# Patient Record
Sex: Male | Born: 1975
Health system: Southern US, Community
[De-identification: ages and names within clinical notes are randomized; demographics above are authoritative.]

## PROBLEM LIST (undated history)

## (undated) DIAGNOSIS — E119 Type 2 diabetes mellitus without complications: Secondary | ICD-10-CM

## (undated) DIAGNOSIS — K760 Fatty (change of) liver, not elsewhere classified: Secondary | ICD-10-CM

## (undated) DIAGNOSIS — I10 Essential (primary) hypertension: Secondary | ICD-10-CM

---

## 2008-04-06 ENCOUNTER — Emergency Department (HOSPITAL_COMMUNITY): Admission: EM | Admit: 2008-04-06 | Discharge: 2008-04-06 | Payer: Self-pay | Admitting: Emergency Medicine

## 2008-06-03 ENCOUNTER — Emergency Department (HOSPITAL_COMMUNITY): Admission: EM | Admit: 2008-06-03 | Discharge: 2008-06-03 | Payer: Self-pay | Admitting: Emergency Medicine

## 2008-08-12 ENCOUNTER — Emergency Department (HOSPITAL_COMMUNITY): Admission: EM | Admit: 2008-08-12 | Discharge: 2008-08-12 | Payer: Self-pay | Admitting: Emergency Medicine

## 2008-12-06 ENCOUNTER — Emergency Department: Payer: Self-pay | Admitting: Emergency Medicine

## 2009-10-13 ENCOUNTER — Emergency Department (HOSPITAL_COMMUNITY): Admission: EM | Admit: 2009-10-13 | Discharge: 2009-10-13 | Payer: Self-pay | Admitting: Emergency Medicine

## 2010-03-11 ENCOUNTER — Emergency Department: Payer: Self-pay | Admitting: Emergency Medicine

## 2010-04-17 ENCOUNTER — Emergency Department: Payer: Self-pay | Admitting: Emergency Medicine

## 2010-05-19 ENCOUNTER — Emergency Department: Payer: Self-pay | Admitting: Internal Medicine

## 2010-07-21 ENCOUNTER — Emergency Department (HOSPITAL_COMMUNITY)
Admission: EM | Admit: 2010-07-21 | Discharge: 2010-07-21 | Payer: Self-pay | Source: Home / Self Care | Admitting: Emergency Medicine

## 2012-03-15 ENCOUNTER — Emergency Department: Payer: Self-pay | Admitting: Emergency Medicine

## 2012-07-22 ENCOUNTER — Emergency Department: Payer: Self-pay | Admitting: Emergency Medicine

## 2012-09-14 ENCOUNTER — Emergency Department: Payer: Self-pay | Admitting: Emergency Medicine

## 2014-06-20 ENCOUNTER — Emergency Department: Payer: Self-pay | Admitting: Emergency Medicine

## 2014-06-20 LAB — COMPREHENSIVE METABOLIC PANEL
ALBUMIN: 3.6 g/dL (ref 3.4–5.0)
ALK PHOS: 47 U/L
ALT: 45 U/L
Anion Gap: 8 (ref 7–16)
BUN: 9 mg/dL (ref 7–18)
Bilirubin,Total: 0.4 mg/dL (ref 0.2–1.0)
CHLORIDE: 108 mmol/L — AB (ref 98–107)
CREATININE: 1.15 mg/dL (ref 0.60–1.30)
Calcium, Total: 8 mg/dL — ABNORMAL LOW (ref 8.5–10.1)
Co2: 23 mmol/L (ref 21–32)
Glucose: 111 mg/dL — ABNORMAL HIGH (ref 65–99)
Osmolality: 277 (ref 275–301)
Potassium: 4 mmol/L (ref 3.5–5.1)
SGOT(AST): 37 U/L (ref 15–37)
Sodium: 139 mmol/L (ref 136–145)
Total Protein: 7.6 g/dL (ref 6.4–8.2)

## 2014-06-20 LAB — CBC WITH DIFFERENTIAL/PLATELET
Basophil #: 0.1 10*3/uL (ref 0.0–0.1)
Basophil %: 0.8 %
EOS ABS: 0.2 10*3/uL (ref 0.0–0.7)
Eosinophil %: 2.5 %
HCT: 43 % (ref 40.0–52.0)
HGB: 14.6 g/dL (ref 13.0–18.0)
LYMPHS PCT: 21.9 %
Lymphocyte #: 2 10*3/uL (ref 1.0–3.6)
MCH: 32 pg (ref 26.0–34.0)
MCHC: 34 g/dL (ref 32.0–36.0)
MCV: 94 fL (ref 80–100)
Monocyte #: 0.7 x10 3/mm (ref 0.2–1.0)
Monocyte %: 7.6 %
NEUTROS PCT: 67.2 %
Neutrophil #: 6.2 10*3/uL (ref 1.4–6.5)
PLATELETS: 251 10*3/uL (ref 150–440)
RBC: 4.57 10*6/uL (ref 4.40–5.90)
RDW: 12.9 % (ref 11.5–14.5)
WBC: 9.2 10*3/uL (ref 3.8–10.6)

## 2014-06-20 LAB — URINALYSIS, COMPLETE
BILIRUBIN, UR: NEGATIVE
BLOOD: NEGATIVE
Bacteria: NONE SEEN
GLUCOSE, UR: NEGATIVE mg/dL (ref 0–75)
KETONE: NEGATIVE
Leukocyte Esterase: NEGATIVE
NITRITE: NEGATIVE
PH: 5 (ref 4.5–8.0)
PROTEIN: NEGATIVE
RBC,UR: 1 /HPF (ref 0–5)
SPECIFIC GRAVITY: 1.026 (ref 1.003–1.030)
WBC UR: 7 /HPF (ref 0–5)

## 2014-06-20 LAB — LIPASE, BLOOD: Lipase: 86 U/L (ref 73–393)

## 2014-06-20 LAB — TROPONIN I: Troponin-I: <0.02 ng/mL

## 2014-07-02 ENCOUNTER — Inpatient Hospital Stay (HOSPITAL_COMMUNITY)
Admission: EM | Admit: 2014-07-02 | Discharge: 2014-07-05 | DRG: 872 | Disposition: A | Discharge status: Home / Self Care | Payer: Self-pay | Attending: Internal Medicine | Admitting: Internal Medicine

## 2014-07-02 ENCOUNTER — Encounter (HOSPITAL_COMMUNITY): Payer: Self-pay | Admitting: Emergency Medicine

## 2014-07-02 ENCOUNTER — Emergency Department (HOSPITAL_COMMUNITY): Payer: Self-pay

## 2014-07-02 DIAGNOSIS — K600 Acute anal fissure: Secondary | ICD-10-CM | POA: Diagnosis present

## 2014-07-02 DIAGNOSIS — A5409 Other gonococcal infection of lower genitourinary tract: Secondary | ICD-10-CM | POA: Diagnosis present

## 2014-07-02 DIAGNOSIS — N281 Cyst of kidney, acquired: Secondary | ICD-10-CM | POA: Diagnosis present

## 2014-07-02 DIAGNOSIS — N200 Calculus of kidney: Secondary | ICD-10-CM | POA: Diagnosis present

## 2014-07-02 DIAGNOSIS — K5900 Constipation, unspecified: Secondary | ICD-10-CM | POA: Diagnosis present

## 2014-07-02 DIAGNOSIS — R1084 Generalized abdominal pain: Secondary | ICD-10-CM

## 2014-07-02 DIAGNOSIS — F1210 Cannabis abuse, uncomplicated: Secondary | ICD-10-CM | POA: Diagnosis present

## 2014-07-02 DIAGNOSIS — K921 Melena: Secondary | ICD-10-CM | POA: Diagnosis present

## 2014-07-02 DIAGNOSIS — N12 Tubulo-interstitial nephritis, not specified as acute or chronic: Secondary | ICD-10-CM | POA: Diagnosis present

## 2014-07-02 DIAGNOSIS — N390 Urinary tract infection, site not specified: Secondary | ICD-10-CM

## 2014-07-02 DIAGNOSIS — R109 Unspecified abdominal pain: Secondary | ICD-10-CM | POA: Diagnosis present

## 2014-07-02 DIAGNOSIS — A549 Gonococcal infection, unspecified: Secondary | ICD-10-CM | POA: Diagnosis present

## 2014-07-02 DIAGNOSIS — K602 Anal fissure, unspecified: Secondary | ICD-10-CM | POA: Diagnosis present

## 2014-07-02 DIAGNOSIS — R0602 Shortness of breath: Secondary | ICD-10-CM

## 2014-07-02 DIAGNOSIS — A419 Sepsis, unspecified organism: Principal | ICD-10-CM | POA: Diagnosis present

## 2014-07-02 DIAGNOSIS — K760 Fatty (change of) liver, not elsewhere classified: Secondary | ICD-10-CM | POA: Diagnosis present

## 2014-07-02 LAB — RAPID URINE DRUG SCREEN, HOSP PERFORMED
Amphetamines: NOT DETECTED
BARBITURATES: NOT DETECTED
BENZODIAZEPINES: NOT DETECTED
Cocaine: NOT DETECTED
Opiates: NOT DETECTED
Tetrahydrocannabinol: POSITIVE — AB

## 2014-07-02 LAB — URINALYSIS, ROUTINE W REFLEX MICROSCOPIC
BILIRUBIN URINE: NEGATIVE
GLUCOSE, UA: NEGATIVE mg/dL
Ketones, ur: NEGATIVE mg/dL
NITRITE: NEGATIVE
PH: 8.5 — AB (ref 5.0–8.0)
Protein, ur: 30 mg/dL — AB
UROBILINOGEN UA: 0.2 mg/dL (ref 0.0–1.0)

## 2014-07-02 LAB — CBC WITH DIFFERENTIAL/PLATELET
BASOS ABS: 0 10*3/uL (ref 0.0–0.1)
BASOS PCT: 0 % (ref 0–1)
EOS PCT: 1 % (ref 0–5)
Eosinophils Absolute: 0.1 10*3/uL (ref 0.0–0.7)
HCT: 41.4 % (ref 39.0–52.0)
Hemoglobin: 14.1 g/dL (ref 13.0–17.0)
LYMPHS ABS: 1.2 10*3/uL (ref 0.7–4.0)
Lymphocytes Relative: 8 % — ABNORMAL LOW (ref 12–46)
MCH: 30.9 pg (ref 26.0–34.0)
MCHC: 34.1 g/dL (ref 30.0–36.0)
MCV: 90.6 fL (ref 78.0–100.0)
MONO ABS: 0.7 10*3/uL (ref 0.1–1.0)
MONOS PCT: 5 % (ref 3–12)
NEUTROS ABS: 12.9 10*3/uL — AB (ref 1.7–7.7)
NEUTROS PCT: 86 % — AB (ref 43–77)
Platelets: 274 10*3/uL (ref 150–400)
RBC: 4.57 MIL/uL (ref 4.22–5.81)
RDW: 12.3 % (ref 11.5–15.5)
WBC: 14.8 10*3/uL — AB (ref 4.0–10.5)

## 2014-07-02 LAB — COMPREHENSIVE METABOLIC PANEL
ALBUMIN: 3.9 g/dL (ref 3.5–5.2)
ALT: 27 U/L (ref 0–53)
ANION GAP: 16 — AB (ref 5–15)
AST: 26 U/L (ref 0–37)
Alkaline Phosphatase: 50 U/L (ref 39–117)
BUN: 9 mg/dL (ref 6–23)
CHLORIDE: 99 meq/L (ref 96–112)
CO2: 20 mEq/L (ref 19–32)
Calcium: 9 mg/dL (ref 8.4–10.5)
Creatinine, Ser: 0.9 mg/dL (ref 0.50–1.35)
GLUCOSE: 107 mg/dL — AB (ref 70–99)
POTASSIUM: 3.8 meq/L (ref 3.7–5.3)
Sodium: 135 mEq/L — ABNORMAL LOW (ref 137–147)
Total Bilirubin: 0.6 mg/dL (ref 0.3–1.2)
Total Protein: 7.7 g/dL (ref 6.0–8.3)

## 2014-07-02 LAB — URINE MICROSCOPIC-ADD ON

## 2014-07-02 LAB — HIV ANTIBODY (ROUTINE TESTING W REFLEX): HIV 1&2 Ab, 4th Generation: NONREACTIVE

## 2014-07-02 LAB — LIPASE, BLOOD: Lipase: 16 U/L (ref 11–59)

## 2014-07-02 LAB — RPR

## 2014-07-02 LAB — APTT: aPTT: 30 seconds (ref 24–37)

## 2014-07-02 LAB — PROTIME-INR
INR: 1.01 (ref 0.00–1.49)
Prothrombin Time: 13.4 seconds (ref 11.6–15.2)

## 2014-07-02 MED ORDER — HYDROMORPHONE HCL 1 MG/ML IJ SOLN
1.0000 mg | Freq: Once | INTRAMUSCULAR | Status: AC
  Administered 2014-07-02: 1 mg via INTRAVENOUS
  Filled 2014-07-02: qty 1

## 2014-07-02 MED ORDER — ONDANSETRON HCL 4 MG/2ML IJ SOLN
4.0000 mg | Freq: Four times a day (QID) | INTRAMUSCULAR | Status: DC | PRN
  Administered 2014-07-03 – 2014-07-04 (×4): 4 mg via INTRAVENOUS
  Filled 2014-07-02 (×6): qty 2

## 2014-07-02 MED ORDER — HYDROMORPHONE HCL 1 MG/ML IJ SOLN: 1.0000 mg | INTRAMUSCULAR | Status: DC | PRN

## 2014-07-02 MED ORDER — ACETAMINOPHEN 500 MG PO TABS
1000.0000 mg | ORAL_TABLET | Freq: Once | ORAL | Status: AC
  Administered 2014-07-02: 1000 mg via ORAL
  Filled 2014-07-02: qty 2

## 2014-07-02 MED ORDER — HYDROMORPHONE HCL 1 MG/ML IJ SOLN
INTRAMUSCULAR | Status: AC
  Filled 2014-07-02: qty 1

## 2014-07-02 MED ORDER — ONDANSETRON HCL 4 MG/2ML IJ SOLN
4.0000 mg | Freq: Once | INTRAMUSCULAR | Status: AC
  Administered 2014-07-02: 4 mg via INTRAVENOUS
  Filled 2014-07-02: qty 2

## 2014-07-02 MED ORDER — METRONIDAZOLE IN NACL 5-0.79 MG/ML-% IV SOLN
500.0000 mg | Freq: Three times a day (TID) | INTRAVENOUS | Status: DC
  Administered 2014-07-02 – 2014-07-04 (×5): 500 mg via INTRAVENOUS
  Filled 2014-07-02 (×7): qty 100

## 2014-07-02 MED ORDER — SODIUM CHLORIDE 0.9 % IV BOLUS (SEPSIS)
1000.0000 mL | Freq: Once | INTRAVENOUS | Status: AC
  Administered 2014-07-02: 1000 mL via INTRAVENOUS

## 2014-07-02 MED ORDER — PANTOPRAZOLE SODIUM 40 MG IV SOLR
40.0000 mg | INTRAVENOUS | Status: DC
  Administered 2014-07-02 – 2014-07-04 (×3): 40 mg via INTRAVENOUS
  Filled 2014-07-02 (×4): qty 40

## 2014-07-02 MED ORDER — ONDANSETRON HCL 4 MG PO TABS: 4.0000 mg | ORAL_TABLET | Freq: Four times a day (QID) | ORAL | Status: DC | PRN

## 2014-07-02 MED ORDER — IOHEXOL 300 MG/ML  SOLN: 25.0000 mL | INTRAMUSCULAR | Status: DC | PRN

## 2014-07-02 MED ORDER — IOHEXOL 300 MG/ML  SOLN
100.0000 mL | Freq: Once | INTRAMUSCULAR | Status: AC | PRN
  Administered 2014-07-02: 100 mL via INTRAVENOUS

## 2014-07-02 MED ORDER — HYDROMORPHONE HCL 1 MG/ML IJ SOLN
1.0000 mg | INTRAMUSCULAR | Status: DC | PRN
  Administered 2014-07-02 – 2014-07-04 (×8): 1 mg via INTRAVENOUS
  Filled 2014-07-02 (×7): qty 1

## 2014-07-02 MED ORDER — CEFTRIAXONE SODIUM 1 G IJ SOLR
1.0000 g | Freq: Once | INTRAMUSCULAR | Status: AC
  Administered 2014-07-02: 1 g via INTRAVENOUS
  Filled 2014-07-02: qty 10

## 2014-07-02 MED ORDER — GUAIFENESIN-DM 100-10 MG/5ML PO SYRP
5.0000 mL | ORAL_SOLUTION | ORAL | Status: DC | PRN
  Filled 2014-07-02: qty 5

## 2014-07-02 MED ORDER — SODIUM CHLORIDE 0.9 % IJ SOLN
3.0000 mL | Freq: Two times a day (BID) | INTRAMUSCULAR | Status: DC
  Administered 2014-07-03 – 2014-07-04 (×3): 3 mL via INTRAVENOUS

## 2014-07-02 MED ORDER — OXYCODONE HCL 5 MG PO TABS
10.0000 mg | ORAL_TABLET | Freq: Four times a day (QID) | ORAL | Status: DC | PRN
  Administered 2014-07-02 – 2014-07-05 (×8): 10 mg via ORAL
  Filled 2014-07-02 (×8): qty 2

## 2014-07-02 MED ORDER — SODIUM CHLORIDE 0.9 % IV SOLN
INTRAVENOUS | Status: AC
  Administered 2014-07-02: 1000 mL via INTRAVENOUS

## 2014-07-02 MED ORDER — ACETAMINOPHEN 650 MG RE SUPP: 650.0000 mg | Freq: Four times a day (QID) | RECTAL | Status: DC | PRN

## 2014-07-02 MED ORDER — ONDANSETRON HCL 4 MG/2ML IJ SOLN: 4.0000 mg | Freq: Three times a day (TID) | INTRAMUSCULAR | Status: DC | PRN

## 2014-07-02 MED ORDER — SODIUM CHLORIDE 0.9 % IV SOLN
INTRAVENOUS | Status: DC
  Administered 2014-07-02 – 2014-07-03 (×3): via INTRAVENOUS

## 2014-07-02 MED ORDER — ACETAMINOPHEN 325 MG PO TABS: 325.0000 mg | ORAL_TABLET | Freq: Once | ORAL | Status: DC

## 2014-07-02 MED ORDER — CEFTRIAXONE SODIUM IN DEXTROSE 40 MG/ML IV SOLN
2.0000 g | INTRAVENOUS | Status: DC
  Administered 2014-07-03 – 2014-07-04 (×2): 2 g via INTRAVENOUS
  Filled 2014-07-02 (×2): qty 50

## 2014-07-02 MED ORDER — ACETAMINOPHEN 325 MG PO TABS
650.0000 mg | ORAL_TABLET | Freq: Four times a day (QID) | ORAL | Status: DC | PRN
  Administered 2014-07-03: 650 mg via ORAL
  Filled 2014-07-02: qty 2

## 2014-07-02 MED ORDER — ALBUTEROL SULFATE (2.5 MG/3ML) 0.083% IN NEBU: 2.5000 mg | INHALATION_SOLUTION | RESPIRATORY_TRACT | Status: DC | PRN

## 2014-07-02 NOTE — ED Notes (Signed)
Patient being transported by Darryl, EMT.

## 2014-07-02 NOTE — ED Notes (Signed)
Patient returned from CT

## 2014-07-02 NOTE — ED Notes (Signed)
PHlebotomy at the bedside getting blood.

## 2014-07-02 NOTE — ED Notes (Signed)
PA [place] at the bedside. Waiting on cultures to start antibiotics.

## 2014-07-02 NOTE — ED Provider Notes (Signed)
Complains of diffuse abdominal pain progressively worsening for one month, worse at left upper quadrant. Accompanied by nausea and diminished appetite pain not made better or worse by anything pain is diffuse. On exam appears uncomfortable abdomen obese diffusely tender  Orlie Dakin, MD 07/02/14 1051

## 2014-07-02 NOTE — ED Notes (Signed)
Internalist at the bedside.

## 2014-07-02 NOTE — ED Notes (Signed)
Pt reports that he has been having abd pain x 1 month with rectal bleeding and penile d/c. Pt also reports nausea and chills x 1 month.

## 2014-07-02 NOTE — Progress Notes (Signed)
Patient trasfered from ED to [phone] via stretcher; alert and oriented x 4; complaining of pain on abdomen area; IV saline locked in RAC NSL; skin intact with tatoos on both hand and neck.Orient patient to room and unit; gave patient care guide; instructed how to use the call bell and  fall risk precautions. Will continue to monitor the patient.

## 2014-07-02 NOTE — H&P (Signed)
PATIENT DETAILS Name: Victor Hale Age: 38 y.o. Sex: male Date of Birth: 02-25-1976 Admit Date: 07/02/2014 PCP:No primary care provider on file.   CHIEF COMPLAINT:  Left sided abdominal pain-1 month Frequency, burning micturition-2 weeks Fever-2 weeks Intermittent painless hematochezia-2 weeks  HPI: Victor Hale is a 38 y.o. male with no Past Medical History of who presents today with the above noted complaint. Patient claims that approximately around and of October he started developing "stomach ache". He claims that the pain was mostly in his left abdominal area, however for the past 1-2 weeks it has now become diffuse. Patient claims that he gets occasional "cramps". He also claims that occasionally he has pain from his left flank to his groin area as well area. He claims that approximately 2 weeks back, he noted that he was having frequency of urination, and occasional dysuria. He claims that he has urgency of urination. He claims that for the past few days he has had constipation and has taken milk of magnesia, he has noted that he has occasional fresh blood in his stools. He claims that he went to [facility] ED 2 weeks ago for evaluation and was sent home. Since his symptoms continued to worsen, he presented to the emergency room today, where he was found to have a leukocytosis, a UA positive for UTI. A CT scan of the abdomen was negative except for small left renal stone. I was asked to admit this patient for further evaluation and treatment. During my evaluation, patient appeared mildly uncomfortable, his pain was moderately well controlled and had received a lot of narcotics. He claims that he was unable to sit up because of pain in the front of his abdomen.  ALLERGIES:  No Known Allergies  PAST MEDICAL HISTORY: History reviewed. No pertinent past medical history.  PAST SURGICAL HISTORY: History reviewed. No pertinent past surgical history.  MEDICATIONS AT HOME: Prior to  Admission medications   Medication Sig Start Date End Date Taking? Authorizing Provider  ibuprofen (ADVIL,MOTRIN) 200 MG tablet Take 400 mg by mouth every 6 (six) hours as needed for mild pain or moderate pain.   Yes Historical Provider, MD    FAMILY HISTORY: No family history on file.  SOCIAL HISTORY:  reports that he has never smoked. He does not have any smokeless tobacco history on file. He reports that he uses illicit drugs (Marijuana) about 6 times per week. He reports that he does not drink alcohol.  REVIEW OF SYSTEMS:  Constitutional:   No  weight loss, fatigue.  HEENT:    No headaches, Difficulty swallowing,Tooth/dental problems,Sore throat  Cardio-vascular: No chest pain,  Orthopnea, PND, swelling in lower extremitie  GI:  No heartburn, indigestion,  Diarrhea  Resp: No shortness of breath with exertion or at rest.  No excess mucus, no productive cough, No non-productive cough,  No coughing up of blood.No change in color of mucus.No wheezing.No chest wall deformity  Skin:  no rash or lesions.  GU:  No hematuria  Musculoskeletal: No joint pain or swelling.  No decreased range of motion.  No back pain.  Psych: No change in mood or affect. No depression or anxiety.  No memory loss.   PHYSICAL EXAM: Blood pressure 140/89, pulse 86, temperature 101.3 F (38.5 C), temperature source Oral, resp. rate 23, height 6\' 2"  (1.88 m), weight 112.038 kg (247 lb), SpO2 97 %.  General appearance :Awake, alert, not in any distress. Speech Clear. Mildly acutely sick looking  HEENT: Atraumatic and Normocephalic, pupils equally reactive to light and accomodation Neck: supple, no JVD. No cervical lymphadenopathy.  Chest:Good air entry bilaterally, no added sounds  CVS: S1 S2 regular, no murmurs.  Abdomen: Bowel sounds present, however diffusely tender all over, but mostly in the left mid to upper abdominal area. No rebound tenderness, mild guarding, however abdomen is mostly  soft. Extremities: B/L Lower Ext shows no edema, both legs are warm to touch Neurology: Awake alert, and oriented X 3, CN II-XII intact, Non focal Skin:No Rash Wounds:N/A  LABS ON ADMISSION:   Recent Labs  07/02/14 1020  NA 135*  K 3.8  CL 99  CO2 20  GLUCOSE 107*  BUN 9  CREATININE 0.90  CALCIUM 9.0    Recent Labs  07/02/14 1020  AST 26  ALT 27  ALKPHOS 50  BILITOT 0.6  PROT 7.7  ALBUMIN 3.9    Recent Labs  07/02/14 1020  LIPASE 16    Recent Labs  07/02/14 1020  WBC 14.8*  NEUTROABS 12.9*  HGB 14.1  HCT 41.4  MCV 90.6  PLT 274   No results for input(s): CKTOTAL, CKMB, CKMBINDEX, TROPONINI in the last 72 hours. No results for input(s): DDIMER in the last 72 hours. Invalid input(s): POCBNP   RADIOLOGIC STUDIES ON ADMISSION: Ct Abdomen Pelvis W Contrast  07/02/2014   CLINICAL DATA:  38 year old severe lower abdominal pain  EXAM: CT ABDOMEN AND PELVIS WITH CONTRAST  TECHNIQUE: Multidetector CT imaging of the abdomen and pelvis was performed using the standard protocol following bolus administration of intravenous contrast.  CONTRAST:  122mL OMNIPAQUE IOHEXOL 300 MG/ML  SOLN  COMPARISON:  None.  FINDINGS: Chest:Mild atelectasis noted in the visualized lung bases. There is no pleural effusion. No suspicious pulmonary nodule or mass is seen in the visualized lung bases. The partially visualized heart may be upper limits of normal for size.  Liver: Unremarkable.  Gallbladder: Unremarkable.  Spleen: Unremarkable.  Pancreas: Unremarkable.  Adrenal glands: Unremarkable.  Kidneys: A 4 mm nonobstructing stone is noted in the lower pole of the left kidney. There is no right-sided nephrolithiasis. A hypodense lesion is seen in the lower pole of the right kidney, compatible with a cyst.  Bowel/gastrointestinal tract: There is no evidence of bowel obstruction. No abnormal bowel wall thickening is identified. There is no free air or free fluid.  Pelvis: The urinary bladder is  unremarkable. There is no suspicious pelvic mass.  Miscellaneous: There is no adenopathy.  Osseous structures: Unremarkable.  IMPRESSION: 4 mm nonobstructing left lower pole renal stone.   Electronically Signed   By: Rosemarie Ax   On: 07/02/2014 12:27   Dg Abd Acute W/chest  07/02/2014   CLINICAL DATA:  Abdominal pain on the left side with fever.  EXAM: ACUTE ABDOMEN SERIES (ABDOMEN 2 VIEW & CHEST 1 VIEW)  COMPARISON:  None.  FINDINGS: Lung volumes are low. Heart and mediastinal contours appear within normal limits. No focal airspace opacities, effusion, or pneumothorax.  There is mild gaseous distention of several small bowel loops in the mid abdomen. There are few air-fluid levels within the small bowel loops.  Gas is seen within nondistended colon.  Stomach is nondistended.  IMPRESSION: Mild gaseous distention of several small bowel loops with scattered air-fluid levels. Findings are nonspecific, but a developing partial small bowel obstruction or changes related to gastroenteritis cannot be excluded.  No acute cardiopulmonary disease.   Electronically Signed   By: Curlene Dolphin M.D.   On: 07/02/2014  10:45     EKG: Independently reviewed. NSR  ASSESSMENT AND PLAN: Present on Admission:  . Pyelonephritis . SIRS (systemic inflammatory response syndrome) . Abdominal pain . Hematochezia  Young 38 year old male without any past medical history presenting with constellation of diffuse abdominal pain, urinary frequency, dysuria and intermittent hematochezia. Initial evaluation suggestive of pyelonephritis. However his abdominal exam is completely out of proportion to clinical and radiological findings. Not sure how much of the abdominal pain is from musculoskeletal etiology. I did speak with radiology-Dr. [place] Pink who reviewed the images that were available to him, no acute abnormalities identified, nothing in the bowels to suggest colitis/obstruction (good-quality CT per radiology), all vessels in  the abdomen are patent, no urethral stone or any findings to suggest pyelonephritis, no evidence of any abscess around the prostate area or in the pelvis. At this time, patient will be placed on empiric IV Rocephin/Flagyl to empirically cover for both gram negatives and anaerobes. Clinical course will need to be followed closely, he will need serial abdominal exams. Blood and urine culture will need to be followed. I have ordered a abdominal ultrasound, although I suspect it will be low yield. I hear no murmurs, but given the duration of his illness, bacteremia is definitely possible. For now I will put him on clear liquids and see how he does. If his abdominal pain persists, in spite of 24/48 hours of antibiotics, he may need repeat imaging and surgical evaluation.  Please follow urinary drug screen, HIV.  Further plan will depend as patient's clinical course evolves and further radiologic and laboratory data become available. Patient will be monitored closely.  Above noted plan was discussed with patient, he was in agreement.   DVT Prophylaxis: SCD's  Code Status: Full Code  Disposition Plan: Home when stable.  Total time spent for admission equals 45 minutes.  [facility] Pager [phone]  If 7PM-7AM, please contact night-coverage [email or web address] Password [facility] 07/02/2014, 5:19 PM

## 2014-07-02 NOTE — ED Provider Notes (Signed)
CSN: [id number]     Arrival date & time 07/02/14  0932 History   First MD Initiated Contact with Patient 07/02/14 [phone]     Chief Complaint  Patient presents with  . Abdominal Pain  . Generalized Body Aches  . Rectal Bleeding  . Penile Discharge     (Consider location/radiation/quality/duration/timing/severity/associated sxs/prior Treatment) The history is provided by the patient.     Pt presents with 3 months of gradually worsening abdominal pain, intermittent CP and SOB, myalgias, chills, rectal bleeding, hematuria, and hemoptysis.  The abdominal pain is cramping, diffuse worst in upper abdomen, constant.  Has had occasional dysuria and urinary frequency.  Has also had white milky discharge from penis.  Has had constipation with pencil thin stools, last BM this morning.  Has occasionally been dizzy, lightheaded.  Was seen at outside ED for same two weeks ago and was sent home, has gotten progressively worse since then.  NO hx abdominal surgeries.   History reviewed. No pertinent past medical history. History reviewed. No pertinent past surgical history. No family history on file. History  Substance Use Topics  . Smoking status: Never Smoker   . Smokeless tobacco: Not on file  . Alcohol Use: No    Review of Systems  All other systems reviewed and are negative.     Allergies  Review of patient's allergies indicates not on file.  Home Medications   Prior to Admission medications   Not on File   BP 149/81 mmHg  Temp(Src) 102.7 F (39.3 C) (Oral)  Resp 12  Ht 6\' 2"  (1.88 m)  Wt 247 lb (112.038 kg)  BMI 31.70 kg/m2  SpO2 100% Physical Exam  Constitutional: He appears well-developed and well-nourished. He appears distressed.  Tearful, uncomfortable appearing  HENT:  Head: Normocephalic and atraumatic.  Neck: Neck supple.  Cardiovascular: Normal rate and regular rhythm.   Pulmonary/Chest: Effort normal and breath sounds normal. No respiratory distress. He has no  wheezes. He has no rales.  Abdominal: Soft. He exhibits no distension and no mass. There is tenderness in the epigastric area, left upper quadrant and left lower quadrant. There is no rebound and no guarding.  obese  Genitourinary: Rectal exam shows tenderness. Rectal exam shows no mass and anal tone normal. Right testis shows no mass, no swelling and no tenderness. Right testis is descended. Left testis shows no mass, no swelling and no tenderness. Left testis is descended. Circumcised. No penile tenderness. Discharge found.  No stool or gross blood on glove.   Neurological: He is alert. He exhibits normal muscle tone.  Skin: He is not diaphoretic.  Nursing note and vitals reviewed.   ED Course  Procedures (including critical care time) Labs Review Labs Reviewed  COMPREHENSIVE METABOLIC PANEL - Abnormal; Notable for the following:    Sodium 135 (*)    Glucose, Bld 107 (*)    Anion gap 16 (*)    All other components within normal limits  CBC WITH DIFFERENTIAL - Abnormal; Notable for the following:    WBC 14.8 (*)    Neutrophils Relative % 86 (*)    Neutro Abs 12.9 (*)    Lymphocytes Relative 8 (*)    All other components within normal limits  URINALYSIS, ROUTINE W REFLEX MICROSCOPIC - Abnormal; Notable for the following:    APPearance TURBID (*)    Specific Gravity, Urine <1.005 (*)    pH 8.5 (*)    Hgb urine dipstick MODERATE (*)    Protein, ur 30 (*)  Leukocytes, UA SMALL (*)    All other components within normal limits  URINE MICROSCOPIC-ADD ON - Abnormal; Notable for the following:    Squamous Epithelial / LPF FEW (*)    Bacteria, UA FEW (*)    All other components within normal limits  GC/CHLAMYDIA PROBE AMP  CULTURE, BLOOD (ROUTINE X 2)  CULTURE, BLOOD (ROUTINE X 2)  LIPASE, BLOOD  PROTIME-INR  APTT  RPR  HIV ANTIBODY (ROUTINE TESTING)  POC OCCULT BLOOD, ED    Imaging Review Ct Abdomen Pelvis W Contrast  07/02/2014   CLINICAL DATA:  38 year old severe lower  abdominal pain  EXAM: CT ABDOMEN AND PELVIS WITH CONTRAST  TECHNIQUE: Multidetector CT imaging of the abdomen and pelvis was performed using the standard protocol following bolus administration of intravenous contrast.  CONTRAST:  122mL OMNIPAQUE IOHEXOL 300 MG/ML  SOLN  COMPARISON:  None.  FINDINGS: Chest:Mild atelectasis noted in the visualized lung bases. There is no pleural effusion. No suspicious pulmonary nodule or mass is seen in the visualized lung bases. The partially visualized heart may be upper limits of normal for size.  Liver: Unremarkable.  Gallbladder: Unremarkable.  Spleen: Unremarkable.  Pancreas: Unremarkable.  Adrenal glands: Unremarkable.  Kidneys: A 4 mm nonobstructing stone is noted in the lower pole of the left kidney. There is no right-sided nephrolithiasis. A hypodense lesion is seen in the lower pole of the right kidney, compatible with a cyst.  Bowel/gastrointestinal tract: There is no evidence of bowel obstruction. No abnormal bowel wall thickening is identified. There is no free air or free fluid.  Pelvis: The urinary bladder is unremarkable. There is no suspicious pelvic mass.  Miscellaneous: There is no adenopathy.  Osseous structures: Unremarkable.  IMPRESSION: 4 mm nonobstructing left lower pole renal stone.   Electronically Signed   By: Rosemarie Ax   On: 07/02/2014 12:27   Dg Abd Acute W/chest  07/02/2014   CLINICAL DATA:  Abdominal pain on the left side with fever.  EXAM: ACUTE ABDOMEN SERIES (ABDOMEN 2 VIEW & CHEST 1 VIEW)  COMPARISON:  None.  FINDINGS: Lung volumes are low. Heart and mediastinal contours appear within normal limits. No focal airspace opacities, effusion, or pneumothorax.  There is mild gaseous distention of several small bowel loops in the mid abdomen. There are few air-fluid levels within the small bowel loops.  Gas is seen within nondistended colon.  Stomach is nondistended.  IMPRESSION: Mild gaseous distention of several small bowel loops with  scattered air-fluid levels. Findings are nonspecific, but a developing partial small bowel obstruction or changes related to gastroenteritis cannot be excluded.  No acute cardiopulmonary disease.   Electronically Signed   By: Curlene Dolphin M.D.   On: 07/02/2014 10:45     EKG Interpretation   Date/Time:  Friday July 02 2014 09:56:46 EST Ventricular Rate:  91 PR Interval:  159 QRS Duration: 84 QT Interval:  334 QTC Calculation: 411 R Axis:   79 Text Interpretation:  Sinus rhythm Probable left atrial enlargement No old  tracing to compare Confirmed by Winfred Leeds  MD, SAM [phone]) on 07/02/2014  10:26:10 AM       10:26 AM Discussed pt with Dr Winfred Leeds who will also see the patient.   Discussed pt with Dr Sloan Leiter who will see and admit the patient.   MDM   Final diagnoses:  Abdominal pain  Pyelonephritis    Febrile, diaphoretic, ill-appearing patient with 3-4 weeks of worsening abdominal pain, urinary symptoms, penile discharge, reported hematuria/hemoptysis/rectal bleeding.  Significant  abdominal tenderness on exam.  Vomiting in room.  Pt does have UTI, elevated WBC, CT abd/pelvis unremarkable.  STD/HIV tests pending.  Pt denies risk of exposure.  Blood, urine cultures pending.  Admitted to Dr Sloan Leiter, [facility].     [place], PA-C 07/02/14 [place], MD 07/02/14 1739

## 2014-07-02 NOTE — Plan of Care (Signed)
Problem: Phase I Progression Outcomes Goal: Vital Signs stable- temperature less than 102 Outcome: Progressing Goal: Initial discharge plan identified Outcome: Progressing Goal: Adequate I & O Outcome: Progressing

## 2014-07-02 NOTE — Plan of Care (Signed)
Problem: Phase I Progression Outcomes Goal: OOB as tolerated unless otherwise ordered Outcome: Completed/Met Date Met:  07/02/14 Goal: Voiding-avoid urinary catheter unless indicated Outcome: Completed/Met Date Met:  07/02/14 Goal: Other Phase I Outcomes/Goals Outcome: Not Applicable Date Met:  44/61/90  Problem: Phase II Progression Outcomes Goal: Voiding independently Outcome: Completed/Met Date Met:  07/02/14 Goal: Other Phase II Outcomes/Goals Outcome: Not Applicable Date Met:  07/27/40  Problem: Phase III Progression Outcomes Goal: Other Phase III Outcomes/Goals Outcome: Not Applicable Date Met:  14/64/31

## 2014-07-03 ENCOUNTER — Inpatient Hospital Stay (HOSPITAL_COMMUNITY): Payer: Self-pay

## 2014-07-03 DIAGNOSIS — N12 Tubulo-interstitial nephritis, not specified as acute or chronic: Secondary | ICD-10-CM

## 2014-07-03 LAB — SEDIMENTATION RATE: Sed Rate: 27 mm/hr — ABNORMAL HIGH (ref 0–16)

## 2014-07-03 LAB — COMPREHENSIVE METABOLIC PANEL
ALT: 25 U/L (ref 0–53)
AST: 25 U/L (ref 0–37)
Albumin: 3.3 g/dL — ABNORMAL LOW (ref 3.5–5.2)
Alkaline Phosphatase: 46 U/L (ref 39–117)
Anion gap: 12 (ref 5–15)
BILIRUBIN TOTAL: 0.3 mg/dL (ref 0.3–1.2)
BUN: 8 mg/dL (ref 6–23)
CHLORIDE: 104 meq/L (ref 96–112)
CO2: 21 mEq/L (ref 19–32)
Calcium: 8.2 mg/dL — ABNORMAL LOW (ref 8.4–10.5)
Creatinine, Ser: 0.91 mg/dL (ref 0.50–1.35)
GFR calc Af Amer: >90 mL/min (ref >90)
GFR calc non Af Amer: >90 mL/min (ref >90)
Glucose, Bld: 109 mg/dL — ABNORMAL HIGH (ref 70–99)
POTASSIUM: 3.9 meq/L (ref 3.7–5.3)
SODIUM: 137 meq/L (ref 137–147)
TOTAL PROTEIN: 6.9 g/dL (ref 6.0–8.3)

## 2014-07-03 LAB — CBC
HCT: 39.4 % (ref 39.0–52.0)
Hemoglobin: 13.4 g/dL (ref 13.0–17.0)
MCH: 32 pg (ref 26.0–34.0)
MCHC: 34 g/dL (ref 30.0–36.0)
MCV: 94 fL (ref 78.0–100.0)
PLATELETS: 221 10*3/uL (ref 150–400)
RBC: 4.19 MIL/uL — ABNORMAL LOW (ref 4.22–5.81)
RDW: 12.5 % (ref 11.5–15.5)
WBC: 9.4 10*3/uL (ref 4.0–10.5)

## 2014-07-03 LAB — URINE CULTURE
COLONY COUNT: NO GROWTH
Culture: NO GROWTH

## 2014-07-03 LAB — C-REACTIVE PROTEIN: CRP: 5.8 mg/dL — ABNORMAL HIGH (ref <0.60)

## 2014-07-03 LAB — OCCULT BLOOD, POC DEVICE: FECAL OCCULT BLD: POSITIVE — AB

## 2014-07-03 LAB — GC/CHLAMYDIA PROBE AMP
CT Probe RNA: NEGATIVE
GC PROBE AMP APTIMA: POSITIVE — AB

## 2014-07-03 LAB — VITAMIN B12: VITAMIN B 12: 388 pg/mL (ref 211–911)

## 2014-07-03 MED ORDER — FUROSEMIDE 10 MG/ML IJ SOLN
20.0000 mg | Freq: Once | INTRAMUSCULAR | Status: AC
  Administered 2014-07-03: 20 mg via INTRAVENOUS
  Filled 2014-07-03: qty 2

## 2014-07-03 MED ORDER — POLYETHYLENE GLYCOL 3350 17 G PO PACK
17.0000 g | PACK | Freq: Two times a day (BID) | ORAL | Status: DC
  Administered 2014-07-03 – 2014-07-05 (×3): 17 g via ORAL
  Filled 2014-07-03 (×7): qty 1

## 2014-07-03 MED ORDER — KETOROLAC TROMETHAMINE 30 MG/ML IJ SOLN
30.0000 mg | Freq: Four times a day (QID) | INTRAMUSCULAR | Status: DC | PRN
  Administered 2014-07-03 (×2): 30 mg via INTRAVENOUS
  Filled 2014-07-03 (×2): qty 1

## 2014-07-03 MED ORDER — AZITHROMYCIN 600 MG PO TABS
1200.0000 mg | ORAL_TABLET | Freq: Once | ORAL | Status: DC
  Filled 2014-07-03: qty 2

## 2014-07-03 MED ORDER — ACETAMINOPHEN 500 MG PO TABS
500.0000 mg | ORAL_TABLET | Freq: Four times a day (QID) | ORAL | Status: DC | PRN
  Administered 2014-07-04: 500 mg via ORAL
  Filled 2014-07-03: qty 1

## 2014-07-03 NOTE — Plan of Care (Signed)
Problem: Phase I Progression Outcomes Goal: Pain controlled with appropriate interventions Outcome: Not Progressing     

## 2014-07-03 NOTE — Progress Notes (Addendum)
Patient complaining of pain "10" out of 10, describing as sharp, crying pain. No prns due at this time. Paged Dr. Marin Comment.   12:28am - MD stated he would place orders.

## 2014-07-03 NOTE — Progress Notes (Signed)
RN at patient bedside and patient is sleeping and resting comfortably. Did not administer pain medicine at this time. Will continue to monitor patient.

## 2014-07-03 NOTE — Plan of Care (Deleted)
     Jru Pense was admitted to the Hospital on 07/02/2014  be excused from work  for  5 days starting 07/02/2014 , may return to work/school without any restrictions.  Call Lala Lund MD, [facility] [phone] with questions.  Thurnell Lose M.D on 07/03/2014,at 6:18 PM  [facility]   Office  [phone]

## 2014-07-03 NOTE — Consult Note (Signed)
Consult for [facility]  Reason for Consult: Hematochezia and abdominal pain. Referring Physician: [facility]  Victor Hale HPI: This is a 38 year old male with no PMH admitted with fever, dysuria, hematochezia, and left-sided abdominal pain.  The patient's symptoms started in October and he has been progressively worsening.  The pain started in the left-side of his abdomen, but then it progressed to involve his entire abdomen.  Work up in the ER revealed an UTI and the CT scan was negative for any evidence of colitis.  In the recent past he used milk of magnesium to treat his constipation.  His Utox is positive for THC and he is positive for gonorrhea.  He states that he was evaluated at [facility], but he was dismissed with some nausea medication  History reviewed. No pertinent past medical history.  History reviewed. No pertinent past surgical history.  No family history on file.  Social History:  reports that he has never smoked. He does not have any smokeless tobacco history on file. He reports that he uses illicit drugs (Marijuana) about 6 times per week. He reports that he does not drink alcohol.  Allergies: No Known Allergies  Medications:  Scheduled: . cefTRIAXone (ROCEPHIN)  IV  2 g Intravenous Q24H  . metronidazole  500 mg Intravenous Q8H  . pantoprazole (PROTONIX) IV  40 mg Intravenous Q24H  . sodium chloride  3 mL Intravenous Q12H   Continuous: . sodium chloride 150 mL/hr at 07/03/14 3474    Results for orders placed or performed during the hospital encounter of 07/02/14 (from the past 24 hour(s))  Comprehensive metabolic panel     Status: Abnormal   Collection Time: 07/02/14 10:20 AM  Result Value Ref Range   Sodium 135 (L) 137 - 147 mEq/L   Potassium 3.8 3.7 - 5.3 mEq/L   Chloride 99 96 - 112 mEq/L   CO2 20 19 - 32 mEq/L   Glucose, Bld 107 (H) 70 - 99 mg/dL   BUN 9 6 - 23 mg/dL   Creatinine, Ser 0.90 0.50 - 1.35 mg/dL   Calcium 9.0 8.4 - 10.5 mg/dL    Total Protein 7.7 6.0 - 8.3 g/dL   Albumin 3.9 3.5 - 5.2 g/dL   AST 26 0 - 37 U/L   ALT 27 0 - 53 U/L   Alkaline Phosphatase 50 39 - 117 U/L   Total Bilirubin 0.6 0.3 - 1.2 mg/dL   GFR calc non Af Amer >90 >90 mL/min   GFR calc Af Amer >90 >90 mL/min   Anion gap 16 (H) 5 - 15  Lipase, blood     Status: None   Collection Time: 07/02/14 10:20 AM  Result Value Ref Range   Lipase 16 11 - 59 U/L  CBC with Differential     Status: Abnormal   Collection Time: 07/02/14 10:20 AM  Result Value Ref Range   WBC 14.8 (H) 4.0 - 10.5 K/uL   RBC 4.57 4.22 - 5.81 MIL/uL   Hemoglobin 14.1 13.0 - 17.0 g/dL   HCT 41.4 39.0 - 52.0 %   MCV 90.6 78.0 - 100.0 fL   MCH 30.9 26.0 - 34.0 pg   MCHC 34.1 30.0 - 36.0 g/dL   RDW 12.3 11.5 - 15.5 %   Platelets 274 150 - 400 K/uL   Neutrophils Relative % 86 (H) 43 - 77 %   Neutro Abs 12.9 (H) 1.7 - 7.7 K/uL   Lymphocytes Relative 8 (L) 12 - 46 %  Lymphs Abs 1.2 0.7 - 4.0 K/uL   Monocytes Relative 5 3 - 12 %   Monocytes Absolute 0.7 0.1 - 1.0 K/uL   Eosinophils Relative 1 0 - 5 %   Eosinophils Absolute 0.1 0.0 - 0.7 K/uL   Basophils Relative 0 0 - 1 %   Basophils Absolute 0.0 0.0 - 0.1 K/uL  Protime-INR     Status: None   Collection Time: 07/02/14 10:20 AM  Result Value Ref Range   Prothrombin Time 13.4 11.6 - 15.2 seconds   INR 1.01 0.00 - 1.49  APTT     Status: None   Collection Time: 07/02/14 10:20 AM  Result Value Ref Range   aPTT 30 24 - 37 seconds  GC/Chlamydia Probe Amp     Status: Abnormal   Collection Time: 07/02/14 11:16 AM  Result Value Ref Range   CT Probe RNA NEGATIVE NEGATIVE   GC Probe RNA POSITIVE (A) NEGATIVE  HIV antibody     Status: None   Collection Time: 07/02/14 11:34 AM  Result Value Ref Range   HIV 1&2 Ab, 4th Generation NONREACTIVE NONREACTIVE  RPR     Status: None   Collection Time: 07/02/14 11:34 AM  Result Value Ref Range   RPR NON REAC NON REAC  Urinalysis, Routine w reflex microscopic     Status: Abnormal    Collection Time: 07/02/14  2:23 PM  Result Value Ref Range   Color, Urine YELLOW YELLOW   APPearance TURBID (A) CLEAR   Specific Gravity, Urine <1.005 (L) 1.005 - 1.030   pH 8.5 (H) 5.0 - 8.0   Glucose, UA NEGATIVE NEGATIVE mg/dL   Hgb urine dipstick MODERATE (A) NEGATIVE   Bilirubin Urine NEGATIVE NEGATIVE   Ketones, ur NEGATIVE NEGATIVE mg/dL   Protein, ur 30 (A) NEGATIVE mg/dL   Urobilinogen, UA 0.2 0.0 - 1.0 mg/dL   Nitrite NEGATIVE NEGATIVE   Leukocytes, UA SMALL (A) NEGATIVE  Urine microscopic-add on     Status: Abnormal   Collection Time: 07/02/14  2:23 PM  Result Value Ref Range   Squamous Epithelial / LPF FEW (A) RARE   WBC, UA 21-50 <3 WBC/hpf   RBC / HPF 3-6 <3 RBC/hpf   Bacteria, UA FEW (A) RARE  Urine rapid drug screen (hosp performed)     Status: Abnormal   Collection Time: 07/02/14  2:23 PM  Result Value Ref Range   Opiates NONE DETECTED NONE DETECTED   Cocaine NONE DETECTED NONE DETECTED   Benzodiazepines NONE DETECTED NONE DETECTED   Amphetamines NONE DETECTED NONE DETECTED   Tetrahydrocannabinol POSITIVE (A) NONE DETECTED   Barbiturates NONE DETECTED NONE DETECTED  CBC     Status: Abnormal   Collection Time: 07/03/14  4:56 AM  Result Value Ref Range   WBC 9.4 4.0 - 10.5 K/uL   RBC 4.19 (L) 4.22 - 5.81 MIL/uL   Hemoglobin 13.4 13.0 - 17.0 g/dL   HCT 39.4 39.0 - 52.0 %   MCV 94.0 78.0 - 100.0 fL   MCH 32.0 26.0 - 34.0 pg   MCHC 34.0 30.0 - 36.0 g/dL   RDW 12.5 11.5 - 15.5 %   Platelets 221 150 - 400 K/uL  Comprehensive metabolic panel     Status: Abnormal   Collection Time: 07/03/14  4:56 AM  Result Value Ref Range   Sodium 137 137 - 147 mEq/L   Potassium 3.9 3.7 - 5.3 mEq/L   Chloride 104 96 - 112 mEq/L   CO2 21 19 -  32 mEq/L   Glucose, Bld 109 (H) 70 - 99 mg/dL   BUN 8 6 - 23 mg/dL   Creatinine, Ser 0.91 0.50 - 1.35 mg/dL   Calcium 8.2 (L) 8.4 - 10.5 mg/dL   Total Protein 6.9 6.0 - 8.3 g/dL   Albumin 3.3 (L) 3.5 - 5.2 g/dL   AST 25 0 - 37 U/L    ALT 25 0 - 53 U/L   Alkaline Phosphatase 46 39 - 117 U/L   Total Bilirubin 0.3 0.3 - 1.2 mg/dL   GFR calc non Af Amer >90 >90 mL/min   GFR calc Af Amer >90 >90 mL/min   Anion gap 12 5 - 15     Ct Abdomen Pelvis W Contrast  07/02/2014   CLINICAL DATA:  38 year old severe lower abdominal pain  EXAM: CT ABDOMEN AND PELVIS WITH CONTRAST  TECHNIQUE: Multidetector CT imaging of the abdomen and pelvis was performed using the standard protocol following bolus administration of intravenous contrast.  CONTRAST:  169mL OMNIPAQUE IOHEXOL 300 MG/ML  SOLN  COMPARISON:  None.  FINDINGS: Chest:Mild atelectasis noted in the visualized lung bases. There is no pleural effusion. No suspicious pulmonary nodule or mass is seen in the visualized lung bases. The partially visualized heart may be upper limits of normal for size.  Liver: Unremarkable.  Gallbladder: Unremarkable.  Spleen: Unremarkable.  Pancreas: Unremarkable.  Adrenal glands: Unremarkable.  Kidneys: A 4 mm nonobstructing stone is noted in the lower pole of the left kidney. There is no right-sided nephrolithiasis. A hypodense lesion is seen in the lower pole of the right kidney, compatible with a cyst.  Bowel/gastrointestinal tract: There is no evidence of bowel obstruction. No abnormal bowel wall thickening is identified. There is no free air or free fluid.  Pelvis: The urinary bladder is unremarkable. There is no suspicious pelvic mass.  Miscellaneous: There is no adenopathy.  Osseous structures: Unremarkable.  IMPRESSION: 4 mm nonobstructing left lower pole renal stone.   Electronically Signed   By: Rosemarie Ax   On: 07/02/2014 12:27   Dg Abd Acute W/chest  07/02/2014   CLINICAL DATA:  Abdominal pain on the left side with fever.  EXAM: ACUTE ABDOMEN SERIES (ABDOMEN 2 VIEW & CHEST 1 VIEW)  COMPARISON:  None.  FINDINGS: Lung volumes are low. Heart and mediastinal contours appear within normal limits. No focal airspace opacities, effusion, or  pneumothorax.  There is mild gaseous distention of several small bowel loops in the mid abdomen. There are few air-fluid levels within the small bowel loops.  Gas is seen within nondistended colon.  Stomach is nondistended.  IMPRESSION: Mild gaseous distention of several small bowel loops with scattered air-fluid levels. Findings are nonspecific, but a developing partial small bowel obstruction or changes related to gastroenteritis cannot be excluded.  No acute cardiopulmonary disease.   Electronically Signed   By: Curlene Dolphin M.D.   On: 07/02/2014 10:45    ROS:  As stated above in the HPI otherwise negative.  Blood pressure 121/66, pulse 72, temperature 98.5 F (36.9 C), temperature source Oral, resp. rate 20, height 6\' 2"  (1.88 m), weight 112.038 kg (247 lb), SpO2 98 %.    PE: Gen: NAD, Alert and Oriented HEENT:  [place]/AT, EOMI Neck: Supple, no LAD Lungs: CTA Bilaterally CV: RRR without M/G/R ABM: Soft, tender in the left side of his abdomen/epigastric region, +BS Ext: No C/C/E Rectal: Posterior anal fissure  Assessment/Plan: 1) ABM pain. 2) UTI. 3) Gonorrhea. 4) Marijuana abuse. 5) Posterior anal  fissure.   The patient does not have IBD.  No history of diarrhea and his CT scan is normal.  His hematochezia is from a posterior anal fissure, which is consistent with his history of constipation.  His presentation is reminiscent of a prior patient with an UTI as the source of the severe abdominal pain.  He reports that his gonorrhea was most likely contracted after he started to have his symptoms, i.e., two weeks ago.  I had a very long discussion with the patient as he was frustrated with the whole situation.  He is dubious about his UTI as the source of all of his symptoms.    Plan: 1) Continue treatment for UTI. 2) Miralax BID to soften stools.  Cristalle Rohm D 07/03/2014, 9:00 AM

## 2014-07-03 NOTE — Progress Notes (Addendum)
RN called into room. Patient vomiting - brown emesis noted in patient's room sink and commode. Patient c/o sharp pain to left side - "10" of 10 per patient. States "i feel cold" pt has overcoat on. Temp noted to be 102.9 at this time. HR 102 , other v/s stable. Given 650mg  tylenol prn PO. Dr. Marin Comment notified. No new orders given.

## 2014-07-03 NOTE — Progress Notes (Addendum)
Patient Demographics  Victor Hale, is a 38 y.o. male, DOB - 1975-12-12, IOE:[id number]  Admit date - 07/02/2014   Admitting Physician Evalee Mutton Kristeen Mans, MD  Outpatient Primary MD for the patient is No primary care provider on file.  LOS - 1   Chief Complaint  Patient presents with  . Abdominal Pain  . Generalized Body Aches  . Rectal Bleeding  . Penile Discharge        Subjective:   Victor Hale today has, No headache, No chest pain, +ve L sided abdominal pain - No Nausea, No new weakness tingling or numbness, No Cough - SOB.   Assessment & Plan    1. Sepsis with Gen.Abd pain more in the L quadrants. With fever diarrhea and hematochezia for a month. Did have leukocytosis on admission, stool studies pending, presentation suspicious for colitis, does have family history of Crohn's. Improved with bowel rest along with Flagyl and Rocephin which will be continued. Have requested Dr. [facility] to evaluate the patient for possible colonoscopy. Have ordered ESR-CRP and a B-12 level. Follow abdominal ultrasound. Follow culture results. Continue supportive care.   2. UTI with a small renal stone which is nonobstructive. UA noted, could have passed a stone in the past, Rocephin to continue, follow abdominal ultrasound result to rule out any developing hydronephrosis. Will need outpatient urology follow-up post discharge.    Addendum - GC probe in the ER +ve for Gonorrhea - On Rocpehin IV, 1.25 gm PO Azithro x 1 also for additional Gonorrhea Rx & Chlamydia coverage. Patient confirms that he had exposure one week ago followed by penile discharge for the last 5-6 days ago, he confirms that his abdominal symptoms have been ongoing for 1 month.     Code Status: Full  Family Communication: mother - wife  bedside  Disposition Plan: Home   Procedures CT Abd Pelvis,  [place] Abd pending   Consults  [facility]   Medications  Scheduled Meds: . cefTRIAXone (ROCEPHIN)  IV  2 g Intravenous Q24H  . metronidazole  500 mg Intravenous Q8H  . pantoprazole (PROTONIX) IV  40 mg Intravenous Q24H  . sodium chloride  3 mL Intravenous Q12H   Continuous Infusions: . sodium chloride 150 mL/hr at 07/03/14 0339   PRN Meds:.acetaminophen **OR** [DISCONTINUED] acetaminophen, albuterol, guaiFENesin-dextromethorphan, HYDROmorphone (DILAUDID) injection, ketorolac, ondansetron **OR** ondansetron (ZOFRAN) IV, oxyCODONE  DVT Prophylaxis    SCDs    Lab Results  Component Value Date   PLT 221 07/03/2014    Antibiotics     Anti-infectives    Start     Dose/Rate Route Frequency Ordered Stop   07/03/14 1000  cefTRIAXone (ROCEPHIN) 2 g in dextrose 5 % 50 mL IVPB - Premix     2 g100 mL/hr over 30 Minutes Intravenous Every 24 hours 07/02/14 1824     07/02/14 1900  metroNIDAZOLE (FLAGYL) IVPB 500 mg     500 mg100 mL/hr over 60 Minutes Intravenous Every 8 hours 07/02/14 1824     07/02/14 1545  cefTRIAXone (ROCEPHIN) 1 g in dextrose 5 % 50 mL IVPB     1 g100 mL/hr over 30 Minutes Intravenous  Once 07/02/14 1533 07/02/14 1706          Objective:  Filed Vitals:   07/02/14 2127 07/03/14 0200 07/03/14 0340 07/03/14 0548  BP: 113/66 107/68  121/66  Pulse: 80 102  72  Temp: 98.1 F (36.7 C) 102.9 F (39.4 C) 98.5 F (36.9 C) 98.5 F (36.9 C)  TempSrc: Oral Oral Oral Oral  Resp: 22 20  20   Height:      Weight:      SpO2: 97% 97%  98%    Wt Readings from Last 3 Encounters:  07/02/14 112.038 kg (247 lb)     Intake/Output Summary (Last 24 hours) at 07/03/14 0917 Last data filed at 07/03/14 0541  Gross per 24 hour  Intake   2720 ml  Output    325 ml  Net   2395 ml     Physical Exam  Awake Alert, Oriented X 3, No new F.N deficits, Normal affect [place].AT,PERRAL Supple Neck,No JVD, No cervical  lymphadenopathy appriciated.  Symmetrical Chest wall movement, Good air movement bilaterally, CTAB RRR,No Gallops,Rubs or new Murmurs, No Parasternal Heave +ve B.Sounds, Abd Soft, diffuse Abd tenderness more in the L Quadrants , No organomegaly appriciated, No rebound - guarding or rigidity. No Cyanosis, Clubbing or edema, No new Rash or bruise    Data Review    Radiology Reports Ct Abdomen Pelvis W Contrast  07/02/2014   CLINICAL DATA:  38 year old severe lower abdominal pain  EXAM: CT ABDOMEN AND PELVIS WITH CONTRAST  TECHNIQUE: Multidetector CT imaging of the abdomen and pelvis was performed using the standard protocol following bolus administration of intravenous contrast.  CONTRAST:  136m OMNIPAQUE IOHEXOL 300 MG/ML  SOLN  COMPARISON:  None.  FINDINGS: Chest:Mild atelectasis noted in the visualized lung bases. There is no pleural effusion. No suspicious pulmonary nodule or mass is seen in the visualized lung bases. The partially visualized heart may be upper limits of normal for size.  Liver: Unremarkable.  Gallbladder: Unremarkable.  Spleen: Unremarkable.  Pancreas: Unremarkable.  Adrenal glands: Unremarkable.  Kidneys: A 4 mm nonobstructing stone is noted in the lower pole of the left kidney. There is no right-sided nephrolithiasis. A hypodense lesion is seen in the lower pole of the right kidney, compatible with a cyst.  Bowel/gastrointestinal tract: There is no evidence of bowel obstruction. No abnormal bowel wall thickening is identified. There is no free air or free fluid.  Pelvis: The urinary bladder is unremarkable. There is no suspicious pelvic mass.  Miscellaneous: There is no adenopathy.  Osseous structures: Unremarkable.  IMPRESSION: 4 mm nonobstructing left lower pole renal stone.   Electronically Signed   By: KRosemarie Ax  On: 07/02/2014 12:27   Dg Abd Acute W/chest  07/02/2014   CLINICAL DATA:  Abdominal pain on the left side with fever.  EXAM: ACUTE ABDOMEN SERIES (ABDOMEN  2 VIEW & CHEST 1 VIEW)  COMPARISON:  None.  FINDINGS: Lung volumes are low. Heart and mediastinal contours appear within normal limits. No focal airspace opacities, effusion, or pneumothorax.  There is mild gaseous distention of several small bowel loops in the mid abdomen. There are few air-fluid levels within the small bowel loops.  Gas is seen within nondistended colon.  Stomach is nondistended.  IMPRESSION: Mild gaseous distention of several small bowel loops with scattered air-fluid levels. Findings are nonspecific, but a developing partial small bowel obstruction or changes related to gastroenteritis cannot be excluded.  No acute cardiopulmonary disease.   Electronically Signed   By: SCurlene DolphinM.D.   On: 07/02/2014 10:45     CBC  Recent  Labs Lab 07/02/14 1020 07/03/14 0456  WBC 14.8* 9.4  HGB 14.1 13.4  HCT 41.4 39.4  PLT 274 221  MCV 90.6 94.0  MCH 30.9 32.0  MCHC 34.1 34.0  RDW 12.3 12.5  LYMPHSABS 1.2  --   MONOABS 0.7  --   EOSABS 0.1  --   BASOSABS 0.0  --     Chemistries   Recent Labs Lab 07/02/14 1020 07/03/14 0456  NA 135* 137  K 3.8 3.9  CL 99 104  CO2 20 21  GLUCOSE 107* 109*  BUN 9 8  CREATININE 0.90 0.91  CALCIUM 9.0 8.2*  AST 26 25  ALT 27 25  ALKPHOS 50 46  BILITOT 0.6 0.3   ------------------------------------------------------------------------------------------------------------------ estimated creatinine clearance is 146.5 mL/min (by C-G formula based on Cr of 0.91). ------------------------------------------------------------------------------------------------------------------ No results for input(s): HGBA1C in the last 72 hours. ------------------------------------------------------------------------------------------------------------------ No results for input(s): CHOL, HDL, LDLCALC, TRIG, CHOLHDL, LDLDIRECT in the last 72  hours. ------------------------------------------------------------------------------------------------------------------ No results for input(s): TSH, T4TOTAL, T3FREE, THYROIDAB in the last 72 hours.  Invalid input(s): FREET3 ------------------------------------------------------------------------------------------------------------------ No results for input(s): VITAMINB12, FOLATE, FERRITIN, TIBC, IRON, RETICCTPCT in the last 72 hours.  Coagulation profile  Recent Labs Lab 07/02/14 1020  INR 1.01    No results for input(s): DDIMER in the last 72 hours.  Cardiac Enzymes No results for input(s): CKMB, TROPONINI, MYOGLOBIN in the last 168 hours.  Invalid input(s): CK ------------------------------------------------------------------------------------------------------------------ Invalid input(s): POCBNP     Time Spent in minutes  35   SINGH,PRASHANT K M.D on 07/03/2014 at 9:17 AM  Between 7am to 7pm - Pager - [phone]  After 7pm go to [email or web address] - password [email or web address]  And look for the night coverage person covering for me after hours  [facility] Office  [phone]

## 2014-07-04 DIAGNOSIS — N390 Urinary tract infection, site not specified: Secondary | ICD-10-CM

## 2014-07-04 DIAGNOSIS — R319 Hematuria, unspecified: Secondary | ICD-10-CM

## 2014-07-04 DIAGNOSIS — A549 Gonococcal infection, unspecified: Secondary | ICD-10-CM

## 2014-07-04 LAB — COMPREHENSIVE METABOLIC PANEL
ALBUMIN: 3.3 g/dL — AB (ref 3.5–5.2)
ALT: 24 U/L (ref 0–53)
ANION GAP: 12 (ref 5–15)
AST: 23 U/L (ref 0–37)
Alkaline Phosphatase: 42 U/L (ref 39–117)
BILIRUBIN TOTAL: 0.3 mg/dL (ref 0.3–1.2)
BUN: 9 mg/dL (ref 6–23)
CHLORIDE: 101 meq/L (ref 96–112)
CO2: 23 mEq/L (ref 19–32)
Calcium: 8.1 mg/dL — ABNORMAL LOW (ref 8.4–10.5)
Creatinine, Ser: 0.91 mg/dL (ref 0.50–1.35)
GFR calc non Af Amer: >90 mL/min (ref >90)
GLUCOSE: 93 mg/dL (ref 70–99)
Potassium: 3.6 mEq/L — ABNORMAL LOW (ref 3.7–5.3)
SODIUM: 136 meq/L — AB (ref 137–147)
Total Protein: 6.8 g/dL (ref 6.0–8.3)

## 2014-07-04 LAB — CBC
HEMATOCRIT: 38.5 % — AB (ref 39.0–52.0)
HEMOGLOBIN: 12.9 g/dL — AB (ref 13.0–17.0)
MCH: 31.5 pg (ref 26.0–34.0)
MCHC: 33.5 g/dL (ref 30.0–36.0)
MCV: 93.9 fL (ref 78.0–100.0)
Platelets: 223 10*3/uL (ref 150–400)
RBC: 4.1 MIL/uL — ABNORMAL LOW (ref 4.22–5.81)
RDW: 12.4 % (ref 11.5–15.5)
WBC: 5.7 10*3/uL (ref 4.0–10.5)

## 2014-07-04 MED ORDER — ONDANSETRON HCL 4 MG/2ML IJ SOLN
4.0000 mg | Freq: Once | INTRAMUSCULAR | Status: AC
  Administered 2014-07-04: 4 mg via INTRAVENOUS

## 2014-07-04 MED ORDER — DOXYCYCLINE HYCLATE 100 MG PO TABS
100.0000 mg | ORAL_TABLET | Freq: Two times a day (BID) | ORAL | Status: DC
  Administered 2014-07-04: 100 mg via ORAL
  Filled 2014-07-04 (×2): qty 1

## 2014-07-04 MED ORDER — POTASSIUM CHLORIDE CRYS ER 20 MEQ PO TBCR: 40.0000 meq | EXTENDED_RELEASE_TABLET | Freq: Once | ORAL | Status: DC

## 2014-07-04 NOTE — Progress Notes (Signed)
Progress Note for [facility]  Subjective: Feeling much better.  Penile discharge has resolved and abdominal pain markedly improved.  Objective: Vital signs in last 24 hours: Temp:  [98.2 F (36.8 C)-100.8 F (38.2 C)] 98.2 F (36.8 C) (11/29 0554) Pulse Rate:  [57-78] 57 (11/29 0619) Resp:  [16-18] 18 (11/29 0556) BP: (80-131)/(45-77) 111/62 mmHg (11/29 0619) SpO2:  [99 %-100 %] 100 % (11/29 0556) Last BM Date: 07/02/14  Intake/Output from previous day: 11/28 0701 - 11/29 0700 In: 840 [P.O.:840] Out: -  Intake/Output this shift:    General appearance: alert and no distress GI: soft, non-tender; bowel sounds normal; no masses,  no organomegaly  Lab Results:  Recent Labs  07/02/14 1020 07/03/14 0456 07/04/14 0425  WBC 14.8* 9.4 5.7  HGB 14.1 13.4 12.9*  HCT 41.4 39.4 38.5*  PLT 274 221 223   BMET  Recent Labs  07/02/14 1020 07/03/14 0456 07/04/14 0425  NA 135* 137 136*  K 3.8 3.9 3.6*  CL 99 104 101  CO2 20 21 23   GLUCOSE 107* 109* 93  BUN 9 8 9   CREATININE 0.90 0.91 0.91  CALCIUM 9.0 8.2* 8.1*   LFT  Recent Labs  07/04/14 0425  PROT 6.8  ALBUMIN 3.3*  AST 23  ALT 24  ALKPHOS 42  BILITOT 0.3   PT/INR  Recent Labs  07/02/14 1020  LABPROT 13.4  INR 1.01   Hepatitis Panel No results for input(s): HEPBSAG, HCVAB, HEPAIGM, HEPBIGM in the last 72 hours. C-Diff No results for input(s): CDIFFTOX in the last 72 hours. Fecal Lactopherrin No results for input(s): FECLLACTOFRN in the last 72 hours.  Studies/Results: US Abdomen Complete  07/03/2014   CLINICAL DATA:  Subsequent evaluation for generalized abdominal pain for 1 month  EXAM: ULTRASOUND ABDOMEN COMPLETE  COMPARISON:  07/02/2014 CT scan  FINDINGS: Gallbladder: No gallstones or wall thickening visualized. No sonographic Murphy sign noted.  Common bile duct: Diameter: 4 mm  Liver: Mildly coarsened echotexture with no focal abnormalities.  IVC: No abnormality visualized.  Pancreas: Not  visualized  Spleen: Size and appearance within normal limits.  Right Kidney: Length: 12.2 cm. Normal except for 14 mm hypoechoic round lesion lower pole as seen on CT scan performed earlier today.  Left Kidney: Length: 13 cm. Normal except for tiny nonobstructing lower pole stone.  Abdominal aorta: Proximal aorta is normal. Mid to distal aorta obscured by bowel gas.  Other findings: None.  IMPRESSION: 1. Mild hepatic parenchymal disease, nonspecific, likely steatosis. 2. 14 mm probable complex cyst lower pole right kidney. On CT scan it appeared somewhat hyper attenuating. This lesion remains technically indeterminate and when clinically feasible should be characterized more fully with contrast-enhanced renal protocol CT or MRI.   Electronically Signed   By: Skipper Cliche M.D.   On: 07/03/2014 10:32   Ct Abdomen Pelvis W Contrast  07/02/2014   CLINICAL DATA:  38 year old severe lower abdominal pain  EXAM: CT ABDOMEN AND PELVIS WITH CONTRAST  TECHNIQUE: Multidetector CT imaging of the abdomen and pelvis was performed using the standard protocol following bolus administration of intravenous contrast.  CONTRAST:  143mL OMNIPAQUE IOHEXOL 300 MG/ML  SOLN  COMPARISON:  None.  FINDINGS: Chest:Mild atelectasis noted in the visualized lung bases. There is no pleural effusion. No suspicious pulmonary nodule or mass is seen in the visualized lung bases. The partially visualized heart may be upper limits of normal for size.  Liver: Unremarkable.  Gallbladder: Unremarkable.  Spleen: Unremarkable.  Pancreas: Unremarkable.  Adrenal glands: Unremarkable.  Kidneys: A 4 mm nonobstructing stone is noted in the lower pole of the left kidney. There is no right-sided nephrolithiasis. A hypodense lesion is seen in the lower pole of the right kidney, compatible with a cyst.  Bowel/gastrointestinal tract: There is no evidence of bowel obstruction. No abnormal bowel wall thickening is identified. There is no free air or free fluid.   Pelvis: The urinary bladder is unremarkable. There is no suspicious pelvic mass.  Miscellaneous: There is no adenopathy.  Osseous structures: Unremarkable.  IMPRESSION: 4 mm nonobstructing left lower pole renal stone.   Electronically Signed   By: Rosemarie Ax   On: 07/02/2014 12:27   Dg Chest Port 1 View  07/03/2014   CLINICAL DATA:  Shortness of breath.  EXAM: PORTABLE CHEST - 1 VIEW  COMPARISON:  July 02, 2014.  FINDINGS: The heart size and mediastinal contours are within normal limits. Both lungs are clear. No pneumothorax or pleural effusion is noted. The visualized skeletal structures are unremarkable.  IMPRESSION: No acute cardiopulmonary abnormality seen.   Electronically Signed   By: Sabino Dick M.D.   On: 07/03/2014 18:26   Dg Abd Acute W/chest  07/02/2014   CLINICAL DATA:  Abdominal pain on the left side with fever.  EXAM: ACUTE ABDOMEN SERIES (ABDOMEN 2 VIEW & CHEST 1 VIEW)  COMPARISON:  None.  FINDINGS: Lung volumes are low. Heart and mediastinal contours appear within normal limits. No focal airspace opacities, effusion, or pneumothorax.  There is mild gaseous distention of several small bowel loops in the mid abdomen. There are few air-fluid levels within the small bowel loops.  Gas is seen within nondistended colon.  Stomach is nondistended.  IMPRESSION: Mild gaseous distention of several small bowel loops with scattered air-fluid levels. Findings are nonspecific, but a developing partial small bowel obstruction or changes related to gastroenteritis cannot be excluded.  No acute cardiopulmonary disease.   Electronically Signed   By: Curlene Dolphin M.D.   On: 07/02/2014 10:45    Medications:  Scheduled: . azithromycin  1,200 mg Oral Once  . cefTRIAXone (ROCEPHIN)  IV  2 g Intravenous Q24H  . metronidazole  500 mg Intravenous Q8H  . pantoprazole (PROTONIX) IV  40 mg Intravenous Q24H  . polyethylene glycol  17 g Oral BID  . sodium chloride  3 mL Intravenous Q12H    Continuous: . sodium chloride 50 mL/hr at 07/03/14 1953    Assessment/Plan: 1) Severe UTI. 2) Gonorrhea. 3) Anal fissure.   He now understands that his abdominal pain can be from a severe UTI.  He feels much better today and he is anxious to be discharged.  I think it is prudent to keep him in the hospital for one more day with ceftriaxone and then to D/C on oral medications.  His anal fissure will be best treated by treating his constipation with laxatives.  Plan: 1) Continue with Ceftriaxone. 2) Laxatives. 3) Signing off.  If any further questions arise for [facility] will assume care as this is a [employer] technical [employer] patient.   LOS: 2 days   Auden Tatar D 07/04/2014, 8:41 AM

## 2014-07-04 NOTE — Plan of Care (Signed)
Problem: Phase I Progression Outcomes Goal: Pain controlled with appropriate interventions Outcome: Progressing     

## 2014-07-04 NOTE — Plan of Care (Signed)
Problem: Phase I Progression Outcomes Goal: Vital Signs stable- temperature less than 102 Outcome: Progressing Goal: Initial discharge plan identified Outcome: Progressing Goal: Adequate I & O Outcome: Progressing

## 2014-07-04 NOTE — Progress Notes (Addendum)
Patient complaining of "10" out of '10" pain and nausea but requesting for RN to bring him potato chips. RN reminded patient of the importance of adhering to his clear liquid diet. No solid foods present at bedside at this time. Will continue to monitor.

## 2014-07-04 NOTE — Progress Notes (Signed)
Pt.refused his heart monitor put it back.MD on call  Alene Mires was made aware.

## 2014-07-04 NOTE — Progress Notes (Signed)
Patient still complaining of nausea after given prn zofran 4mg  IV at 2221. Schorr, NP notified. Orders placed for one time dose of zofran 4mg  IV.

## 2014-07-04 NOTE — Progress Notes (Signed)
Patient Demographics  [facility], is a 38 y.o. male, DOB - 1976-05-01, ONG:[id number]  Admit date - 07/02/2014   Admitting Physician Evalee Mutton Kristeen Mans, MD  Outpatient Primary MD for the patient is No primary care provider on file.  LOS - 2   Chief Complaint  Patient presents with  . Abdominal Pain  . Generalized Body Aches  . Rectal Bleeding  . Penile Discharge        Subjective:   Abdominal pain almost resolved. No other symptoms.   Assessment & Plan    1. Sepsis with Gen.Abd pain more in the L quadrants. With fever diarrhea and hematochezia for a month. Did have leukocytosis on admission, presentation suspicious for colitis, does have family history of Crohn's. Improved with bowel rest along with Flagyl and Rocephin.  - hepatic ultrasound reveal mildl  hepatic parenchymal disease- either non-specific vs steatosis Dr Candiss Norse requested Dr. [facility] to evaluate the patient for possible colonoscopy.- Dr Benson [place] recommends today to be the last dose of Rocephin. He does not recommend any further GI work up and suspects that the UTI as been causing the abdominal symptoms.   2. UTI- urine culture negative- today is day 3 of Rocephin- will d/c today -  small renal stone which is nonobstructive- could have passed a stone in the past, - 14 mm complex cyst on right kidney - will need f/u CT with contrast enhanced renal protocol - Will need outpatient urology follow-up post discharge.   3. GC probe +ve for Gonorrhea  - Has been on Rocpehin 2 gm IV daily x 3 days and has received sufficient treatment, -1.25 gm PO Azithro x 1 also given to cover for chlamydia.  Patient confirms that he had exposure one week ago followed by penile discharge for the last 5-6 days ago    Code Status: Full  Family  Communication:   Disposition Plan: Home   Procedures CT Abd Pelvis,  [place] Abd pending   Consults  [facility]   Medications  Scheduled Meds: . azithromycin  1,200 mg Oral Once  . doxycycline  100 mg Oral Q12H  . pantoprazole (PROTONIX) IV  40 mg Intravenous Q24H  . polyethylene glycol  17 g Oral BID  . potassium chloride  40 mEq Oral Once  . sodium chloride  3 mL Intravenous Q12H   Continuous Infusions:   PRN Meds:.acetaminophen **OR** [DISCONTINUED] acetaminophen, albuterol, guaiFENesin-dextromethorphan, HYDROmorphone (DILAUDID) injection, ketorolac, ondansetron **OR** ondansetron (ZOFRAN) IV, oxyCODONE  DVT Prophylaxis    SCDs    Lab Results  Component Value Date   PLT 223 07/04/2014    Antibiotics     Anti-infectives    Start     Dose/Rate Route Frequency Ordered Stop   07/04/14 1215  doxycycline (VIBRA-TABS) tablet 100 mg     100 mg Oral Every 12 hours 07/04/14 1214     07/03/14 1200  azithromycin (ZITHROMAX) tablet 1,200 mg     1,200 mg Oral  Once 07/03/14 1155     07/03/14 1000  cefTRIAXone (ROCEPHIN) 2 g in dextrose 5 % 50 mL IVPB - Premix  Status:  Discontinued     2 g100 mL/hr over 30 Minutes Intravenous Every 24 hours 07/02/14 1824 07/04/14 1213   07/02/14 1900  metroNIDAZOLE (FLAGYL) IVPB 500 mg  Status:  Discontinued     500 mg100 mL/hr over 60 Minutes Intravenous Every 8 hours 07/02/14 1824 07/04/14 1214   07/02/14 1545  cefTRIAXone (ROCEPHIN) 1 g in dextrose 5 % 50 mL IVPB     1 g100 mL/hr over 30 Minutes Intravenous  Once 07/02/14 1533 07/02/14 1706          Objective:   Filed Vitals:   07/03/14 2220 07/04/14 0554 07/04/14 0556 07/04/14 0619  BP: 131/77 80/46 96/45  111/62  Pulse: 78 60 64 57  Temp: 98.9 F (37.2 C) 98.2 F (36.8 C)    TempSrc: Oral Oral    Resp: 18 18 18    Height:      Weight:      SpO2: 100% 100% 100%     Wt Readings from Last 3 Encounters:  07/02/14 112.038 kg (247 lb)     Intake/Output Summary (Last 24 hours) at  07/04/14 1450 Last data filed at 07/03/14 1809  Gross per 24 hour  Intake    240 ml  Output      0 ml  Net    240 ml     Physical Exam  Awake Alert, Oriented X 3, No new F.N deficits, Normal affect [place].AT,PERRAL Supple Neck,No JVD, No cervical lymphadenopathy appriciated.  Symmetrical Chest wall movement, Good air movement bilaterally, CTAB RRR,No Gallops,Rubs or new Murmurs, No Parasternal Heave +ve B.Sounds, Abd Soft, diffuse Abd tenderness more in the L Quadrants , No organomegaly appriciated, No rebound - guarding or rigidity. No Cyanosis, Clubbing or edema, No new Rash or bruise    Data Review    Radiology Reports US Abdomen Complete  07/03/2014   CLINICAL DATA:  Subsequent evaluation for generalized abdominal pain for 1 month  EXAM: ULTRASOUND ABDOMEN COMPLETE  COMPARISON:  07/02/2014 CT scan  FINDINGS: Gallbladder: No gallstones or wall thickening visualized. No sonographic Murphy sign noted.  Common bile duct: Diameter: 4 mm  Liver: Mildly coarsened echotexture with no focal abnormalities.  IVC: No abnormality visualized.  Pancreas: Not visualized  Spleen: Size and appearance within normal limits.  Right Kidney: Length: 12.2 cm. Normal except for 14 mm hypoechoic round lesion lower pole as seen on CT scan performed earlier today.  Left Kidney: Length: 13 cm. Normal except for tiny nonobstructing lower pole stone.  Abdominal aorta: Proximal aorta is normal. Mid to distal aorta obscured by bowel gas.  Other findings: None.  IMPRESSION: 1. Mild hepatic parenchymal disease, nonspecific, likely steatosis. 2. 14 mm probable complex cyst lower pole right kidney. On CT scan it appeared somewhat hyper attenuating. This lesion remains technically indeterminate and when clinically feasible should be characterized more fully with contrast-enhanced renal protocol CT or MRI.   Electronically Signed   By: Skipper Cliche M.D.   On: 07/03/2014 10:32   Ct Abdomen Pelvis W Contrast  07/02/2014    CLINICAL DATA:  38 year old severe lower abdominal pain  EXAM: CT ABDOMEN AND PELVIS WITH CONTRAST  TECHNIQUE: Multidetector CT imaging of the abdomen and pelvis was performed using the standard protocol following bolus administration of intravenous contrast.  CONTRAST:  134mL OMNIPAQUE IOHEXOL 300 MG/ML  SOLN  COMPARISON:  None.  FINDINGS: Chest:Mild atelectasis noted in the visualized lung bases. There is no pleural effusion. No suspicious pulmonary nodule or mass is seen in the visualized lung bases. The partially visualized heart may be upper limits of normal for size.  Liver: Unremarkable.  Gallbladder: Unremarkable.  Spleen: Unremarkable.  Pancreas:  Unremarkable.  Adrenal glands: Unremarkable.  Kidneys: A 4 mm nonobstructing stone is noted in the lower pole of the left kidney. There is no right-sided nephrolithiasis. A hypodense lesion is seen in the lower pole of the right kidney, compatible with a cyst.  Bowel/gastrointestinal tract: There is no evidence of bowel obstruction. No abnormal bowel wall thickening is identified. There is no free air or free fluid.  Pelvis: The urinary bladder is unremarkable. There is no suspicious pelvic mass.  Miscellaneous: There is no adenopathy.  Osseous structures: Unremarkable.  IMPRESSION: 4 mm nonobstructing left lower pole renal stone.   Electronically Signed   By: Rosemarie Ax   On: 07/02/2014 12:27   Dg Chest Port 1 View  07/03/2014   CLINICAL DATA:  Shortness of breath.  EXAM: PORTABLE CHEST - 1 VIEW  COMPARISON:  July 02, 2014.  FINDINGS: The heart size and mediastinal contours are within normal limits. Both lungs are clear. No pneumothorax or pleural effusion is noted. The visualized skeletal structures are unremarkable.  IMPRESSION: No acute cardiopulmonary abnormality seen.   Electronically Signed   By: Sabino Dick M.D.   On: 07/03/2014 18:26   Dg Abd Acute W/chest  07/02/2014   CLINICAL DATA:  Abdominal pain on the left side with fever.  EXAM:  ACUTE ABDOMEN SERIES (ABDOMEN 2 VIEW & CHEST 1 VIEW)  COMPARISON:  None.  FINDINGS: Lung volumes are low. Heart and mediastinal contours appear within normal limits. No focal airspace opacities, effusion, or pneumothorax.  There is mild gaseous distention of several small bowel loops in the mid abdomen. There are few air-fluid levels within the small bowel loops.  Gas is seen within nondistended colon.  Stomach is nondistended.  IMPRESSION: Mild gaseous distention of several small bowel loops with scattered air-fluid levels. Findings are nonspecific, but a developing partial small bowel obstruction or changes related to gastroenteritis cannot be excluded.  No acute cardiopulmonary disease.   Electronically Signed   By: Curlene Dolphin M.D.   On: 07/02/2014 10:45     CBC  Recent Labs Lab 07/02/14 1020 07/03/14 0456 07/04/14 0425  WBC 14.8* 9.4 5.7  HGB 14.1 13.4 12.9*  HCT 41.4 39.4 38.5*  PLT 274 221 223  MCV 90.6 94.0 93.9  MCH 30.9 32.0 31.5  MCHC 34.1 34.0 33.5  RDW 12.3 12.5 12.4  LYMPHSABS 1.2  --   --   MONOABS 0.7  --   --   EOSABS 0.1  --   --   BASOSABS 0.0  --   --     Chemistries   Recent Labs Lab 07/02/14 1020 07/03/14 0456 07/04/14 0425  NA 135* 137 136*  K 3.8 3.9 3.6*  CL 99 104 101  CO2 20 21 23   GLUCOSE 107* 109* 93  BUN 9 8 9   CREATININE 0.90 0.91 0.91  CALCIUM 9.0 8.2* 8.1*  AST 26 25 23   ALT 27 25 24   ALKPHOS 50 46 42  BILITOT 0.6 0.3 0.3   ------------------------------------------------------------------------------------------------------------------ estimated creatinine clearance is 146.5 mL/min (by C-G formula based on Cr of 0.91). ------------------------------------------------------------------------------------------------------------------ No results for input(s): HGBA1C in the last 72 hours. ------------------------------------------------------------------------------------------------------------------ No results for input(s): CHOL, HDL,  LDLCALC, TRIG, CHOLHDL, LDLDIRECT in the last 72 hours. ------------------------------------------------------------------------------------------------------------------ No results for input(s): TSH, T4TOTAL, T3FREE, THYROIDAB in the last 72 hours.  Invalid input(s): FREET3 ------------------------------------------------------------------------------------------------------------------  Recent Labs  07/03/14 1328  VITAMINB12 388    Coagulation profile  Recent Labs Lab 07/02/14 1020  INR 1.01  No results for input(s): DDIMER in the last 72 hours.  Cardiac Enzymes No results for input(s): CKMB, TROPONINI, MYOGLOBIN in the last 168 hours.  Invalid input(s): CK ------------------------------------------------------------------------------------------------------------------ Invalid input(s): POCBNP     Time Spent in minutes  48   [place] M.D on 07/04/2014 at 2:50 PM  Between 7am to 7pm - Pager - [phone]  After 7pm go to [email or web address] - password [email or web address]  And look for the night coverage person covering for me after hours  [facility] Office  [phone]

## 2014-07-05 DIAGNOSIS — N390 Urinary tract infection, site not specified: Secondary | ICD-10-CM

## 2014-07-05 DIAGNOSIS — K600 Acute anal fissure: Secondary | ICD-10-CM | POA: Diagnosis present

## 2014-07-05 DIAGNOSIS — K5900 Constipation, unspecified: Secondary | ICD-10-CM | POA: Diagnosis present

## 2014-07-05 DIAGNOSIS — A549 Gonococcal infection, unspecified: Secondary | ICD-10-CM | POA: Diagnosis present

## 2014-07-05 MED ORDER — PANTOPRAZOLE SODIUM 40 MG PO TBEC: 40.0000 mg | DELAYED_RELEASE_TABLET | Freq: Every day | ORAL | Status: DC

## 2014-07-05 MED ORDER — OXYCODONE HCL 5 MG PO TABS: 5.0000 mg | ORAL_TABLET | Freq: Four times a day (QID) | ORAL | Status: DC | PRN

## 2014-07-05 MED ORDER — DOCUSATE SODIUM 100 MG PO CAPS: 100.0000 mg | ORAL_CAPSULE | Freq: Two times a day (BID) | ORAL | Status: DC

## 2014-07-05 NOTE — Discharge Summary (Signed)
Physician Discharge Summary  Victor Hale PIR:[id number] DOB: Nov 28, 1975 DOA: 07/02/2014  PCP: No primary care provider on file.  Admit date: 07/02/2014 Discharge date: 07/05/2014  Time spent: 45 minutes  Recommendations for Outpatient Follow-up:   Right renal cyst noted.  Needs Renal CT vs MRI in outpatient follow up.  PCP follow up for anal fissure, constipation, UTI/pyelonephritis, fatty liver.  Discharge Diagnoses:  Principal Problem:   Sepsis Active Problems:   UTI (urinary tract infection)   Abdominal pain   Gonorrhea in male   Constipation   Acute anal fissure   Discharge Condition: stable  Diet recommendation: heart healthy  Filed Weights   07/02/14 0954  Weight: 112.038 kg (247 lb)    History of present illness:  Victor Hale is a 38 y.o. male with no past medical history of who presented with fever, hematochezia, left sided abdominal pain and burning on urination.  Patient claims that approximately around the end of October he started developing "stomach ache". He claims that the pain was mostly in his left abdominal area, however for the past 1-2 weeks it has now become diffuse. He claims that approximately 2 weeks back, he noted that he was having frequency of urination, and occasional dysuria. He claims that he has urgency of urination. He claims that for the past few days he has had constipation and has taken milk of magnesia, he has noted that he has occasional fresh blood in his stools.  He was found to have a leukocytosis, a UA positive for UTI. Marland Kitchen  Hospital Course:   1. Sepsis secondary to UTI and Gonorrhea infection. - Treated with antibiotics and resolved.  Blood cultures show no growth to date.    2. UTI- urine culture negative- treated with 3 days of IV Rocephin.   - small renal stone which is nonobstructive- could have passed a stone in the past, - 14 mm complex cyst on right kidney - will need f/u CT with contrast enhanced renal protocol or MRI -  attempted to schedule an appointment prior to discharge with [facility] but the patient declined and stated he would schedule the appointment himself when he was an home and had his insurance paper work handy.  3. GC probe positive for Gonorrhea  - Received Rocephin 2 gm IV daily x 3 days -1.25 gm PO Azithro x 1 also given to cover for chlamydia.  - Patient confirms that he had exposure one week ago followed by penile discharge for the last 5-6 days ago  4.  Anal Fissure - Seen by Gastroenterology.   - Treated with laxatives.  Patient advised to remain on colace bid until fissure is healed.  5.  Fatty liver - Mild.  Patient aware. - Stable for outpatient follow up.   Consultations:  Gastroenterology  Discharge Exam: Filed Vitals:   07/05/14 0631  BP: 118/78  Pulse: 63  Temp: 98.4 F (36.9 C)  Resp: 18   General:  Awake Alert, Oriented X 3, No new F.N deficits, Normal affect, ambulating. Neck:  Supple Neck,No JVD, No cervical lymphadenopathy apprEciated.  Resp:  Symmetrical Chest wall movement, Good air movement bilaterally, CTAB CV:  RRR,No Gallops,Rubs or new Murmurs, No Parasternal Heave Abdomen: Obese,  +ve B.Sounds, Abd Soft, mild tenderness to palp in left upper and lower quadrants , No rebound - guarding or rigidity. Extremities:  No Cyanosis, Clubbing or edema, No new Rash or bruise    Discharge Instructions   Discharge Instructions    Diet - low  sodium heart healthy    Complete by:  As directed      Increase activity slowly    Complete by:  As directed           Discharge Medication List as of 07/05/2014 11:37 AM    START taking these medications   Details  docusate sodium (COLACE) 100 MG capsule Take 1 capsule (100 mg total) by mouth 2 (two) times daily., Starting 07/05/2014, Until Discontinued, No Print    oxyCODONE (OXY IR/ROXICODONE) 5 MG immediate release tablet Take 1 tablet (5 mg total) by mouth every 6 (six) hours as needed for severe  pain., Starting 07/05/2014, Until Discontinued, Print      CONTINUE these medications which have NOT CHANGED   Details  ibuprofen (ADVIL,MOTRIN) 200 MG tablet Take 400 mg by mouth every 6 (six) hours as needed for mild pain or moderate pain., Until Discontinued, Historical Med       No Known Allergies Follow-up Information    Follow up with primary care physician In 1 week.   Why:  Please schedule an appointment with a primary care physician for hospital follow up.      Follow up with [facility] Schedule an appointment as soon as possible for a visit in 2 weeks.   Why:  Please call for follow up regarding the Cyst on your right kidney.   Contact information:   [address]  [place] [address] [place] [address] [phone]        The results of significant diagnostics from this hospitalization (including imaging, microbiology, ancillary and laboratory) are listed below for reference.    Significant Diagnostic Studies: US Abdomen Complete  07/03/2014   CLINICAL DATA:  Subsequent evaluation for generalized abdominal pain for 1 month  EXAM: ULTRASOUND ABDOMEN COMPLETE  COMPARISON:  07/02/2014 CT scan  FINDINGS: Gallbladder: No gallstones or wall thickening visualized. No sonographic Murphy sign noted.  Common bile duct: Diameter: 4 mm  Liver: Mildly coarsened echotexture with no focal abnormalities.  IVC: No abnormality visualized.  Pancreas: Not visualized  Spleen: Size and appearance within normal limits.  Right Kidney: Length: 12.2 cm. Normal except for 14 mm hypoechoic round lesion lower pole as seen on CT scan performed earlier today.  Left Kidney: Length: 13 cm. Normal except for tiny nonobstructing lower pole stone.  Abdominal aorta: Proximal aorta is normal. Mid to distal aorta obscured by bowel gas.  Other findings: None.  IMPRESSION: 1. Mild hepatic parenchymal disease, nonspecific, likely steatosis. 2. 14 mm probable complex cyst lower pole right kidney. On CT scan  it appeared somewhat hyper attenuating. This lesion remains technically indeterminate and when clinically feasible should be characterized more fully with contrast-enhanced renal protocol CT or MRI.   Electronically Signed   By: Skipper Cliche M.D.   On: 07/03/2014 10:32   Ct Abdomen Pelvis W Contrast  07/02/2014   CLINICAL DATA:  38 year old severe lower abdominal pain  EXAM: CT ABDOMEN AND PELVIS WITH CONTRAST  TECHNIQUE: Multidetector CT imaging of the abdomen and pelvis was performed using the standard protocol following bolus administration of intravenous contrast.  CONTRAST:  160mL OMNIPAQUE IOHEXOL 300 MG/ML  SOLN  COMPARISON:  None.  FINDINGS: Chest:Mild atelectasis noted in the visualized lung bases. There is no pleural effusion. No suspicious pulmonary nodule or mass is seen in the visualized lung bases. The partially visualized heart may be upper limits of normal for size.  Liver: Unremarkable.  Gallbladder: Unremarkable.  Spleen: Unremarkable.  Pancreas:  Unremarkable.  Adrenal glands: Unremarkable.  Kidneys: A 4 mm nonobstructing stone is noted in the lower pole of the left kidney. There is no right-sided nephrolithiasis. A hypodense lesion is seen in the lower pole of the right kidney, compatible with a cyst.  Bowel/gastrointestinal tract: There is no evidence of bowel obstruction. No abnormal bowel wall thickening is identified. There is no free air or free fluid.  Pelvis: The urinary bladder is unremarkable. There is no suspicious pelvic mass.  Miscellaneous: There is no adenopathy.  Osseous structures: Unremarkable.  IMPRESSION: 4 mm nonobstructing left lower pole renal stone.   Electronically Signed   By: Rosemarie Ax   On: 07/02/2014 12:27   Dg Chest Port 1 View  07/03/2014   CLINICAL DATA:  Shortness of breath.  EXAM: PORTABLE CHEST - 1 VIEW  COMPARISON:  July 02, 2014.  FINDINGS: The heart size and mediastinal contours are within normal limits. Both lungs are clear. No  pneumothorax or pleural effusion is noted. The visualized skeletal structures are unremarkable.  IMPRESSION: No acute cardiopulmonary abnormality seen.   Electronically Signed   By: Sabino Dick M.D.   On: 07/03/2014 18:26   Dg Abd Acute W/chest  07/02/2014   CLINICAL DATA:  Abdominal pain on the left side with fever.  EXAM: ACUTE ABDOMEN SERIES (ABDOMEN 2 VIEW & CHEST 1 VIEW)  COMPARISON:  None.  FINDINGS: Lung volumes are low. Heart and mediastinal contours appear within normal limits. No focal airspace opacities, effusion, or pneumothorax.  There is mild gaseous distention of several small bowel loops in the mid abdomen. There are few air-fluid levels within the small bowel loops.  Gas is seen within nondistended colon.  Stomach is nondistended.  IMPRESSION: Mild gaseous distention of several small bowel loops with scattered air-fluid levels. Findings are nonspecific, but a developing partial small bowel obstruction or changes related to gastroenteritis cannot be excluded.  No acute cardiopulmonary disease.   Electronically Signed   By: Curlene Dolphin M.D.   On: 07/02/2014 10:45    Microbiology: Recent Results (from the past 240 hour(s))  GC/Chlamydia Probe Amp     Status: Abnormal   Collection Time: 07/02/14 11:16 AM  Result Value Ref Range Status   CT Probe RNA NEGATIVE NEGATIVE Final   GC Probe RNA POSITIVE (A) NEGATIVE Final    Comment: (NOTE) A Positive CT or NG Nucleic Acid Amplification Test (NAAT) result should be considered presumptive evidence of infection.  The result should be evaluated along with physical examination and other diagnostic findings.                                                                                       **Normal Reference Range: Negative**      Assay performed using the Gen-Probe APTIMA COMBO2 (R) Assay. Acceptable specimen types for this assay include APTIMA Swabs (Unisex, endocervical, urethral, or vaginal), first void urine, and ThinPrep liquid  based cytology samples. Performed at [facility]   Urine culture     Status: None   Collection Time: 07/02/14  2:23 PM  Result Value Ref Range Status   Specimen Description URINE, CLEAN CATCH  Final   Special Requests NONE  Final   Culture  Setup Time   Final    07/02/2014 23:01 Performed at [place] Performed at [facility]   Final   Culture NO GROWTH Performed at [facility]   Final   Report Status 07/03/2014 FINAL  Final  Blood culture (routine x 2)     Status: None (Preliminary result)   Collection Time: 07/02/14  3:56 PM  Result Value Ref Range Status   Specimen Description BLOOD FOREARM LEFT  Final   Special Requests BOTTLES DRAWN AEROBIC AND ANAEROBIC B 5CC, R 4CC  Final   Culture  Setup Time   Final    07/02/2014 22:41 Performed at [facility]    Culture   Final           BLOOD CULTURE RECEIVED NO GROWTH TO DATE CULTURE WILL BE HELD FOR 5 DAYS BEFORE ISSUING A FINAL NEGATIVE REPORT Performed at [facility]    Report Status PENDING  Incomplete  Blood culture (routine x 2)     Status: None (Preliminary result)   Collection Time: 07/02/14  4:03 PM  Result Value Ref Range Status   Specimen Description BLOOD HAND LEFT  Final   Special Requests BOTTLES DRAWN AEROBIC AND ANAEROBIC 5CC  Final   Culture  Setup Time   Final    07/02/2014 22:43 Performed at [facility]    Culture   Final           BLOOD CULTURE RECEIVED NO GROWTH TO DATE CULTURE WILL BE HELD FOR 5 DAYS BEFORE ISSUING A FINAL NEGATIVE REPORT Performed at [facility]    Report Status PENDING  Incomplete     Labs: Basic Metabolic Panel:  Recent Labs Lab 07/02/14 1020 07/03/14 0456 07/04/14 0425  NA 135* 137 136*  K 3.8 3.9 3.6*  CL 99 104 101  CO2 20 21 23   GLUCOSE 107* 109* 93  BUN 9 8 9   CREATININE 0.90 0.91 0.91  CALCIUM 9.0 8.2* 8.1*   Liver Function Tests:  Recent Labs Lab  07/02/14 1020 07/03/14 0456 07/04/14 0425  AST 26 25 23   ALT 27 25 24   ALKPHOS 50 46 42  BILITOT 0.6 0.3 0.3  PROT 7.7 6.9 6.8  ALBUMIN 3.9 3.3* 3.3*    Recent Labs Lab 07/02/14 1020  LIPASE 16   CBC:  Recent Labs Lab 07/02/14 1020 07/03/14 0456 07/04/14 0425  WBC 14.8* 9.4 5.7  NEUTROABS 12.9*  --   --   HGB 14.1 13.4 12.9*  HCT 41.4 39.4 38.5*  MCV 90.6 94.0 93.9  PLT 274 221 223     Signed:  Melton Alar, PA-C [facility] 07/05/2014, 3:58 PM    I have examined the patient, reviewed the chart and modified the above note which I agree with.   Debbe Odea, MD

## 2014-07-05 NOTE — Care Management Note (Signed)
    Page 1 of 1   07/05/2014     4:33:38 PM CARE MANAGEMENT NOTE 07/05/2014  Patient:  [facility]   Account Number:  [id number]  Date Initiated:  07/05/2014  Documentation initiated by:  Tomi Bamberger  Subjective/Objective Assessment:   dx anal fisure, cyst on kidney, gib  admit- from home.     Action/Plan:   Anticipated DC Date:  07/05/2014   Anticipated DC Plan:  [place]  CM consult      Choice offered to / List presented to:             Status of service:   Medicare Important Message given?  NO (If response is "NO", the following Medicare IM given date fields will be blank) Date Medicare IM given:   Medicare IM given by:   Date Additional Medicare IM given:   Additional Medicare IM given by:    Discharge Disposition:  HOME/SELF CARE  Per UR Regulation:  Reviewed for med. necessity/level of care/duration of stay  If discussed at [place] of Stay Meetings, dates discussed:    Comments:  07/05/14  [place] ,BSN [phone] patient dc before NCM saw him, patient dc on oxy, motrin and colace, per floor RN, patient did not need med ast. Patient informed MD that he will have a pcp through his insurance that he will have soon.

## 2014-07-05 NOTE — Plan of Care (Signed)
Problem: Consults Goal: UTI/Pyelonephritis Patient Education See Patient Education Module for education specifics.  Outcome: Completed/Met Date Met:  07/05/14

## 2014-07-05 NOTE — Progress Notes (Signed)
D/c instructions reviewed with pt, copy of instructions, script and letter for his work given to pt. Pt d/c'd via wheelchair with belongings, escorted by hospital volunteer. Pt's ride picking up at entrance.

## 2014-07-05 NOTE — Discharge Instructions (Signed)
Please establish yourself with a family doctor for your fatty liver and urinary tract infection.   Please follow up with [facility] regarding the Cyst on your right Kidney.  You were cared for by a hospitalist during your hospital stay. Once you are discharged, your primary care physician will handle any further medical issues. Please note that NO REFILLS for any discharge medications will be authorized once you are discharged, as it is imperative that you return to your primary care physician (or establish a relationship with a primary care physician if you do not have one) for your aftercare needs so that they can reassess your need for medications and monitor your lab values.

## 2014-07-05 NOTE — Plan of Care (Signed)
Problem: Phase II Progression Outcomes Goal: Tolerating diet Outcome: Progressing     

## 2014-07-08 LAB — CULTURE, BLOOD (ROUTINE X 2)
Culture: NO GROWTH
Culture: NO GROWTH

## 2014-10-12 ENCOUNTER — Emergency Department (HOSPITAL_COMMUNITY)
Admission: EM | Admit: 2014-10-12 | Discharge: 2014-10-12 | Disposition: A | Discharge status: Home / Self Care | Payer: Self-pay | Attending: Emergency Medicine | Admitting: Emergency Medicine

## 2014-10-12 ENCOUNTER — Emergency Department (HOSPITAL_COMMUNITY): Payer: Self-pay

## 2014-10-12 ENCOUNTER — Encounter (HOSPITAL_COMMUNITY): Payer: Self-pay

## 2014-10-12 DIAGNOSIS — R197 Diarrhea, unspecified: Secondary | ICD-10-CM | POA: Insufficient documentation

## 2014-10-12 DIAGNOSIS — Z87448 Personal history of other diseases of urinary system: Secondary | ICD-10-CM | POA: Insufficient documentation

## 2014-10-12 DIAGNOSIS — R112 Nausea with vomiting, unspecified: Secondary | ICD-10-CM | POA: Insufficient documentation

## 2014-10-12 DIAGNOSIS — K219 Gastro-esophageal reflux disease without esophagitis: Secondary | ICD-10-CM | POA: Insufficient documentation

## 2014-10-12 DIAGNOSIS — R1084 Generalized abdominal pain: Secondary | ICD-10-CM | POA: Insufficient documentation

## 2014-10-12 DIAGNOSIS — R0602 Shortness of breath: Secondary | ICD-10-CM | POA: Insufficient documentation

## 2014-10-12 LAB — URINALYSIS, ROUTINE W REFLEX MICROSCOPIC
Bilirubin Urine: NEGATIVE
Glucose, UA: NEGATIVE mg/dL
HGB URINE DIPSTICK: NEGATIVE
Ketones, ur: NEGATIVE mg/dL
LEUKOCYTES UA: NEGATIVE
Nitrite: NEGATIVE
Protein, ur: NEGATIVE mg/dL
Specific Gravity, Urine: 1.031 — ABNORMAL HIGH (ref 1.005–1.030)
UROBILINOGEN UA: 1 mg/dL (ref 0.0–1.0)
pH: 5.5 (ref 5.0–8.0)

## 2014-10-12 LAB — COMPREHENSIVE METABOLIC PANEL
ALT: 32 U/L (ref 0–53)
ANION GAP: 7 (ref 5–15)
AST: 27 U/L (ref 0–37)
Albumin: 4 g/dL (ref 3.5–5.2)
Alkaline Phosphatase: 48 U/L (ref 39–117)
BUN: 10 mg/dL (ref 6–23)
CALCIUM: 8.8 mg/dL (ref 8.4–10.5)
CHLORIDE: 110 mmol/L (ref 96–112)
CO2: 21 mmol/L (ref 19–32)
CREATININE: 0.96 mg/dL (ref 0.50–1.35)
GFR calc Af Amer: >90 mL/min (ref >90)
Glucose, Bld: 84 mg/dL (ref 70–99)
Potassium: 3.3 mmol/L — ABNORMAL LOW (ref 3.5–5.1)
SODIUM: 138 mmol/L (ref 135–145)
Total Bilirubin: 0.4 mg/dL (ref 0.3–1.2)
Total Protein: 7.9 g/dL (ref 6.0–8.3)

## 2014-10-12 LAB — CBC
HCT: 43.3 % (ref 39.0–52.0)
Hemoglobin: 15.1 g/dL (ref 13.0–17.0)
MCH: 31.4 pg (ref 26.0–34.0)
MCHC: 34.9 g/dL (ref 30.0–36.0)
MCV: 90 fL (ref 78.0–100.0)
PLATELETS: 258 10*3/uL (ref 150–400)
RBC: 4.81 MIL/uL (ref 4.22–5.81)
RDW: 12.8 % (ref 11.5–15.5)
WBC: 8.3 10*3/uL (ref 4.0–10.5)

## 2014-10-12 LAB — I-STAT TROPONIN, ED: Troponin i, poc: 0 ng/mL (ref 0.00–0.08)

## 2014-10-12 LAB — LIPASE, BLOOD: Lipase: 23 U/L (ref 11–59)

## 2014-10-12 MED ORDER — ONDANSETRON 4 MG PO TBDP: 4.0000 mg | ORAL_TABLET | Freq: Three times a day (TID) | ORAL | Status: DC | PRN

## 2014-10-12 MED ORDER — DICYCLOMINE HCL 20 MG PO TABS: 20.0000 mg | ORAL_TABLET | Freq: Two times a day (BID) | ORAL | Status: DC

## 2014-10-12 MED ORDER — DICYCLOMINE HCL 10 MG PO CAPS
20.0000 mg | ORAL_CAPSULE | Freq: Once | ORAL | Status: AC
  Administered 2014-10-12: 20 mg via ORAL
  Filled 2014-10-12: qty 2

## 2014-10-12 MED ORDER — ONDANSETRON HCL 4 MG/2ML IJ SOLN
4.0000 mg | Freq: Once | INTRAMUSCULAR | Status: AC
  Administered 2014-10-12: 4 mg via INTRAVENOUS
  Filled 2014-10-12: qty 2

## 2014-10-12 MED ORDER — OMEPRAZOLE 20 MG PO CPDR: 20.0000 mg | DELAYED_RELEASE_CAPSULE | Freq: Every day | ORAL | Status: DC

## 2014-10-12 MED ORDER — SODIUM CHLORIDE 0.9 % IV BOLUS (SEPSIS)
1000.0000 mL | Freq: Once | INTRAVENOUS | Status: AC
  Administered 2014-10-12: 1000 mL via INTRAVENOUS

## 2014-10-12 NOTE — ED Provider Notes (Signed)
The patient is a obese 39 year old male, recent history of pyelonephritis which left him admitted to the hospital for over 5 days, this was 3 months ago. He presents to the hospital today with abdominal pain, some nausea and vomiting, some urinary frequency. On exam the patient is obese, he has a tender abdomen in the right, left upper abdomens as well as the epigastrium with mild guarding, no lower abdominal tenderness, no peritoneal signs. He has normal heart and lung sounds, no peripheral edema, vital signs are unremarkable. Check urine, check blood work for gastrointestinal sources, check a troponin as his EKG is abnormal with T wave inversions in leads 3 and aVF. Right upper quadrant ultrasound.   EKG Interpretation  Date/Time:  Tuesday October 12 2014 12:04:24 EST Ventricular Rate:  77 PR Interval:  176 QRS Duration: 90 QT Interval:  388 QTC Calculation: 439 R Axis:   83 Text Interpretation:  Normal sinus rhythm Cannot rule out Anterior infarct , age undetermined T wave abnormality, consider inferior ischemia Abnormal ECG Since last tracing T wave inversion NOW PRESENT Confirmed by Kadia Abaya  MD, Haunani Dickard ([id number]) on 10/12/2014 1:14:21 PM       EKG Interpretation  Date/Time:  Tuesday October 12 2014 16:05:56 EST Ventricular Rate:  69 PR Interval:  180 QRS Duration: 93 QT Interval:  407 QTC Calculation: 436 R Axis:   77 Text Interpretation:  Sinus rhythm Consider left atrial enlargement Since last tracing T wave abnormality mimproved. Abnormal ekg Confirmed by Sabra Heck  MD, Corrinna Karapetyan ([id number]) on 10/12/2014 4:21:27 PM        Medical screening examination/treatment/procedure(s) were conducted as a shared visit with non-physician practitioner(s) and myself.  I personally evaluated the patient during the encounter.  Clinical Impression:   Final diagnoses:  Nausea vomiting and diarrhea  Generalized abdominal pain  Gastroesophageal reflux disease, esophagitis presence not specified          Noemi Chapel, MD 10/12/14 2007

## 2014-10-12 NOTE — ED Notes (Signed)
Pt. Having chest pain began on Sunday pt. Describes it as "Feeling like he has a blockage"  Pt. Is sob with the pain.  Pt. Also reports that he was having rt. Flank pain.  Pt. Thinks he has something wrong with his kidney also has GERD.   Pt. Reports that he has vomited for 2 days.  Also has a fever chills.

## 2014-10-12 NOTE — ED Notes (Addendum)
Pt states "i have kidney problems, and I feel like I blockage in chest--I had to put my head in the freezer just to breath"

## 2014-10-12 NOTE — ED Provider Notes (Signed)
CSN: [id number]     Arrival date & time 10/12/14  1152 History   First MD Initiated Contact with Patient 10/12/14 1249     Chief Complaint  Patient presents with  . Chest Pain   Victor Hale is a 39 y.o. male with a history of pyelonephritis who presents to the ED complaining of multiple complaints including nausea, vomiting, abdominal pain, fevers, shortness of breath and chest pain ongoing for the past 3 days. Patient reports that 3 days ago he started having bilateral flank pain, nausea, vomiting, diarrhea as well as cough and chest pain. He reports feeling short of breath and that it hurts to breath. He rates is pain at 8/10 and constant and characterizes as pressure or tightness. His chest pain is across his bilateral low chest. He also reports his "kidney's" hurt. He reports dysuria a few days ago but none today. He reports chronic urinary frequency and urgency over the past several months. Currently he complains of nausea and bilateral flank pain. She reports vomiting once today and having 4 episodes of diarrhea today. The patient reports taking Zantac and Alka-Seltzer for treatment today. He reports lots of acid reflux. The patient denies previous abdominal surgeries. The patient denies palpitations, rashes, hematuria, or sick contacts.   (Consider location/radiation/quality/duration/timing/severity/associated sxs/prior Treatment) HPI  History reviewed. No pertinent past medical history. History reviewed. No pertinent past surgical history. No family history on file. History  Substance Use Topics  . Smoking status: Never Smoker   . Smokeless tobacco: Not on file  . Alcohol Use: No    Review of Systems  Constitutional: Positive for fever and chills.  HENT: Negative for congestion, sore throat and trouble swallowing.   Eyes: Negative for pain and visual disturbance.  Respiratory: Positive for cough, chest tightness and shortness of breath. Negative for wheezing.   Cardiovascular:  Positive for chest pain. Negative for palpitations and leg swelling.  Gastrointestinal: Positive for nausea, vomiting, abdominal pain and diarrhea.  Genitourinary: Positive for dysuria, urgency, frequency and flank pain. Negative for hematuria, decreased urine volume, difficulty urinating, penile pain and testicular pain.  Musculoskeletal: Negative for back pain and neck pain.  Skin: Negative for rash and wound.  Neurological: Negative for dizziness and headaches.      Allergies  Review of patient's allergies indicates no known allergies.  Home Medications   Prior to Admission medications   Medication Sig Start Date End Date Taking? Authorizing Provider  Multiple Vitamins-Minerals (ONE DAILY MULTIVITAMIN MEN PO) Take 1 tablet by mouth daily.   Yes Historical Provider, MD  dicyclomine (BENTYL) 20 MG tablet Take 1 tablet (20 mg total) by mouth 2 (two) times daily. 10/12/14   Waynetta Pean, PA-C  docusate sodium (COLACE) 100 MG capsule Take 1 capsule (100 mg total) by mouth 2 (two) times daily. Patient not taking: Reported on 10/12/2014 07/05/14   Melton Alar, PA-C  omeprazole (PRILOSEC) 20 MG capsule Take 1 capsule (20 mg total) by mouth daily. 10/12/14   Waynetta Pean, PA-C  ondansetron (ZOFRAN ODT) 4 MG disintegrating tablet Take 1 tablet (4 mg total) by mouth every 8 (eight) hours as needed for nausea or vomiting. 10/12/14   Waynetta Pean, PA-C  oxyCODONE (OXY IR/ROXICODONE) 5 MG immediate release tablet Take 1 tablet (5 mg total) by mouth every 6 (six) hours as needed for severe pain. Patient not taking: Reported on 10/12/2014 07/05/14   Bobby Rumpf [place], PA-C   BP 134/73 mmHg  Pulse 61  Temp(Src) 98.4 F (36.9 C) (  Oral)  Resp 8  SpO2 99% Physical Exam  Constitutional: He is oriented to person, place, and time. He appears well-developed and well-nourished. No distress.  Non-toxic appearing.   HENT:  Head: Normocephalic and atraumatic.  Mouth/Throat: Oropharynx is clear and moist.  No oropharyngeal exudate.  Eyes: Conjunctivae are normal. Pupils are equal, round, and reactive to light. Right eye exhibits no discharge. Left eye exhibits no discharge.  Neck: Neck supple. No JVD present. No tracheal deviation present.  Cardiovascular: Normal rate, regular rhythm, normal heart sounds and intact distal pulses.  Exam reveals no gallop and no friction rub.   No murmur heard. Bilateral radial, posterior tibialis and dorsalis pedis pulses are intact.   Pulmonary/Chest: Effort normal and breath sounds normal. No respiratory distress. He has no wheezes. He has no rales. He exhibits no tenderness.  Abdominal: Soft. Bowel sounds are normal. He exhibits no distension. There is tenderness. There is no rebound.  Abdomen is soft. Bowel sounds are present. Patient has bilateral CVA tenderness with left greater than his right. He also left-sided abdominal tenderness as well as right upper quadrant epigastric abdominal tenderness.   Musculoskeletal: He exhibits no edema or tenderness.  No lower extremity edema or tenderness.  Lymphadenopathy:    He has no cervical adenopathy.  Neurological: He is alert and oriented to person, place, and time. Coordination normal.  Skin: Skin is warm and dry. No rash noted. He is not diaphoretic. No erythema. No pallor.  Psychiatric: He has a normal mood and affect. His behavior is normal.  Nursing note and vitals reviewed.   ED Course  Procedures (including critical care time) Labs Review Labs Reviewed  COMPREHENSIVE METABOLIC PANEL - Abnormal; Notable for the following:    Potassium 3.3 (*)    All other components within normal limits  URINALYSIS, ROUTINE W REFLEX MICROSCOPIC - Abnormal; Notable for the following:    Specific Gravity, Urine 1.031 (*)    All other components within normal limits  URINE CULTURE  CBC  LIPASE, BLOOD  I-STAT TROPOININ, ED    Imaging Review Dg Chest 2 View  10/12/2014   CLINICAL DATA:  Chest pressure for several  days.  Initial encounter.  EXAM: CHEST  2 VIEW  COMPARISON:  07/03/2014.  FINDINGS: Cardiopericardial silhouette within normal limits. Mediastinal contours normal. Trachea midline. No airspace disease or effusion. Lung volumes are low on the frontal view. No focal consolidation.  IMPRESSION: No active cardiopulmonary disease.   Electronically Signed   By: Dereck Ligas M.D.   On: 10/12/2014 13:48   US Abdomen Complete  10/12/2014   CLINICAL DATA:  Upper abdominal pain for 4 days with nausea and vomiting.  EXAM: ULTRASOUND ABDOMEN COMPLETE  COMPARISON:  CT abdomen and pelvis 07/02/2014. Abdominal ultrasound 07/03/2014.  FINDINGS: Gallbladder: No gallstones or wall thickening visualized. No sonographic Murphy sign noted.  Common bile duct: Diameter: 0.5 cm  Liver: Increased echogenicity and coarsened echotexture are identified. No focal lesion or biliary ductal dilatation.  IVC: No abnormality visualized.  Pancreas: Visualized portion unremarkable.  Spleen: Size and appearance within normal limits.  Right Kidney: Length: 12.3 cm. Echogenicity within normal limits. No hydronephrosis visualized. 2.2 cm hypoechoic lesion in the lower pole is consistent with a simple cyst, unchanged.  Left Kidney: Length: 13.1 cm. Echogenicity within normal limits. No mass or hydronephrosis visualized.  Abdominal aorta: No aneurysm visualized.  Other findings: None.  IMPRESSION: No acute abnormality.  Negative for gallstones.  Fatty infiltration of the liver.   Electronically  Signed   By: Inge Rise M.D.   On: 10/12/2014 16:10     EKG Interpretation   Date/Time:  Tuesday October 12 2014 16:05:56 EST Ventricular Rate:  69 PR Interval:  180 QRS Duration: 93 QT Interval:  407 QTC Calculation: 436 R Axis:   77 Text Interpretation:  Sinus rhythm Consider left atrial enlargement Since  last tracing T wave abnormality mimproved. Abnormal ekg Confirmed by  MILLER  MD, BRIAN ([id number]) on 10/12/2014 4:21:27 PM      Filed  Vitals:   10/12/14 1400 10/12/14 1415 10/12/14 1430 10/12/14 1445  BP: 112/79 126/64 129/79 134/73  Pulse: 63 59 57 61  Temp:      TempSrc:      Resp: 11 16 8 8   SpO2: 100% 99% 98% 99%     MDM   Meds given in ED:  Medications  dicyclomine (BENTYL) capsule 20 mg (not administered)  sodium chloride 0.9 % bolus 1,000 mL (1,000 mLs Intravenous New Bag/Given 10/12/14 1350)  ondansetron (ZOFRAN) injection 4 mg (4 mg Intravenous Given 10/12/14 1400)    New Prescriptions   DICYCLOMINE (BENTYL) 20 MG TABLET    Take 1 tablet (20 mg total) by mouth 2 (two) times daily.   OMEPRAZOLE (PRILOSEC) 20 MG CAPSULE    Take 1 capsule (20 mg total) by mouth daily.   ONDANSETRON (ZOFRAN ODT) 4 MG DISINTEGRATING TABLET    Take 1 tablet (4 mg total) by mouth every 8 (eight) hours as needed for nausea or vomiting.    Final diagnoses:  Nausea vomiting and diarrhea  Generalized abdominal pain  Gastroesophageal reflux disease, esophagitis presence not specified   This is a 39 y.o. male with a history of pyelonephritis who presents to the ED complaining of multiple complaints including nausea, vomiting, abdominal pain, fevers, shortness of breath and chest pain ongoing for the past 3 days. He was admitted for pyelonephritis 4 months ago. The patient is afebrile and nontoxic-appearing. The patient is not tachypneic, tachycardic or hypoxic. Patient's lungs are clear to auscultation bilaterally. He has no chest tenderness to palpation. Patient's abdomen is generally tender to palpation. His abdomen is soft and bowel sounds are present.  He is worse in his right upper quadrant and left upper and lower quadrant. He has bilateral CVA tenderness with his left worse than his right. His urinalysis is unremarkable and negative for infection. He has a negative troponin. His CBC is within normal limits. His CMP shows a potassium of 3.3 and is otherwise unremarkable. His lipase is normal at 23. CXR is negative. Initial EKG showed  T wave abnormality, second EKG showed improvement. Abdominal ultrasound shows no acute abnormality and is negative for gallstones. At second reevaluation the patient reports feeling much better. He reports his abdominal pain has resolved but he still feels slightly nauseated. He tolerated water and Bentyl prior to discharge. Patient likely has a viral gastroenteritis associated with GERD. We'll discharge this patient with Zofran, omeprazole and Bentyl. I advised patient to obtain a primary care provider and to follow-up with them. I advised the patient to follow-up with their primary care provider as soon as able. I advised the patient to return to the emergency department with new or worsening symptoms or new concerns. The patient verbalized understanding and agreement with plan.   This patient was discussed with and evaluated by Dr. Sabra Heck who agrees with assessment and plan.    Waynetta Pean, PA-C 10/12/14 1629  Noemi Chapel, MD 10/12/14 2007

## 2014-10-12 NOTE — Discharge Instructions (Signed)
Nausea and Vomiting °Nausea is a sick feeling that often comes before throwing up (vomiting). Vomiting is a reflex where stomach contents come out of your mouth. Vomiting can cause severe loss of body fluids (dehydration). Children and elderly adults can become dehydrated quickly, especially if they also have diarrhea. Nausea and vomiting are symptoms of a condition or disease. It is important to find the cause of your symptoms. °CAUSES  °· Direct irritation of the stomach lining. This irritation can result from increased acid production (gastroesophageal reflux disease), infection, food poisoning, taking certain medicines (such as nonsteroidal anti-inflammatory drugs), alcohol use, or tobacco use. °· Signals from the brain. These signals could be caused by a headache, heat exposure, an inner ear disturbance, increased pressure in the brain from injury, infection, a tumor, or a concussion, pain, emotional stimulus, or metabolic problems. °· An obstruction in the gastrointestinal tract (bowel obstruction). °· Illnesses such as diabetes, hepatitis, gallbladder problems, appendicitis, kidney problems, cancer, sepsis, atypical symptoms of a heart attack, or eating disorders. °· Medical treatments such as chemotherapy and radiation. °· Receiving medicine that makes you sleep (general anesthetic) during surgery. °DIAGNOSIS °Your caregiver may ask for tests to be done if the problems do not improve after a few days. Tests may also be done if symptoms are severe or if the reason for the nausea and vomiting is not clear. Tests may include: °· Urine tests. °· Blood tests. °· Stool tests. °· Cultures (to look for evidence of infection). °· X-rays or other imaging studies. °Test results can help your caregiver make decisions about treatment or the need for additional tests. °TREATMENT °You need to stay well hydrated. Drink frequently but in small amounts. You may wish to drink water, sports drinks, clear broth, or eat frozen  ice pops or gelatin dessert to help stay hydrated. When you eat, eating slowly may help prevent nausea. There are also some antinausea medicines that may help prevent nausea. °HOME CARE INSTRUCTIONS  °· Take all medicine as directed by your caregiver. °· If you do not have an appetite, do not force yourself to eat. However, you must continue to drink fluids. °· If you have an appetite, eat a normal diet unless your caregiver tells you differently. °¨ Eat a variety of complex carbohydrates (rice, wheat, potatoes, bread), lean meats, yogurt, fruits, and vegetables. °¨ Avoid high-fat foods because they are more difficult to digest. °· Drink enough water and fluids to keep your urine clear or pale yellow. °· If you are dehydrated, ask your caregiver for specific rehydration instructions. Signs of dehydration may include: °¨ Severe thirst. °¨ Dry lips and mouth. °¨ Dizziness. °¨ Dark urine. °¨ Decreasing urine frequency and amount. °¨ Confusion. °¨ Rapid breathing or pulse. °SEEK IMMEDIATE MEDICAL CARE IF:  °· You have blood or brown flecks (like coffee grounds) in your vomit. °· You have black or bloody stools. °· You have a severe headache or stiff neck. °· You are confused. °· You have severe abdominal pain. °· You have chest pain or trouble breathing. °· You do not urinate at least once every 8 hours. °· You develop cold or clammy skin. °· You continue to vomit for longer than 24 to 48 hours. °· You have a fever. °MAKE SURE YOU:  °· Understand these instructions. °· Will watch your condition. °· Will get help right away if you are not doing well or get worse. °Document Released: 07/23/2005 Document Revised: 10/15/2011 Document Reviewed: 12/20/2010 °[employer]® Patient Information ©2015 [employer]. This information is not intended   to replace advice given to you by your health care provider. Make sure you discuss any questions you have with your health care provider.  Abdominal Pain Many things can cause abdominal  pain. Usually, abdominal pain is not caused by a disease and will improve without treatment. It can often be observed and treated at home. Your health care provider will do a physical exam and possibly order blood tests and X-rays to help determine the seriousness of your pain. However, in many cases, more time must pass before a clear cause of the pain can be found. Before that point, your health care provider may not know if you need more testing or further treatment. HOME CARE INSTRUCTIONS  Monitor your abdominal pain for any changes. The following actions may help to alleviate any discomfort you are experiencing:  Only take over-the-counter or prescription medicines as directed by your health care provider.  Do not take laxatives unless directed to do so by your health care provider.  Try a clear liquid diet (broth, tea, or water) as directed by your health care provider. Slowly move to a bland diet as tolerated. SEEK MEDICAL CARE IF:  You have unexplained abdominal pain.  You have abdominal pain associated with nausea or diarrhea.  You have pain when you urinate or have a bowel movement.  You experience abdominal pain that wakes you in the night.  You have abdominal pain that is worsened or improved by eating food.  You have abdominal pain that is worsened with eating fatty foods.  You have a fever. SEEK IMMEDIATE MEDICAL CARE IF:   Your pain does not go away within 2 hours.  You keep throwing up (vomiting).  Your pain is felt only in portions of the abdomen, such as the right side or the left lower portion of the abdomen.  You pass bloody or black tarry stools. MAKE SURE YOU:  Understand these instructions.   Will watch your condition.   Will get help right away if you are not doing well or get worse.  Document Released: 05/02/2005 Document Revised: 07/28/2013 Document Reviewed: 04/01/2013 [facility] Patient Information 2015 [place], [place]. This information is not  intended to replace advice given to you by your health care provider. Make sure you discuss any questions you have with your health care provider. Gastroesophageal Reflux Disease, Adult Gastroesophageal reflux disease (GERD) happens when acid from your stomach flows up into the esophagus. When acid comes in contact with the esophagus, the acid causes soreness (inflammation) in the esophagus. Over time, GERD may create small holes (ulcers) in the lining of the esophagus. CAUSES   Increased body weight. This puts pressure on the stomach, making acid rise from the stomach into the esophagus.  Smoking. This increases acid production in the stomach.  Drinking alcohol. This causes decreased pressure in the lower esophageal sphincter (valve or ring of muscle between the esophagus and stomach), allowing acid from the stomach into the esophagus.  Late evening meals and a full stomach. This increases pressure and acid production in the stomach.  A malformed lower esophageal sphincter. Sometimes, no cause is found. SYMPTOMS   Burning pain in the lower part of the mid-chest behind the breastbone and in the mid-stomach area. This may occur twice a week or more often.  Trouble swallowing.  Sore throat.  Dry cough.  Asthma-like symptoms including chest tightness, shortness of breath, or wheezing. DIAGNOSIS  Your caregiver may be able to diagnose GERD based on your symptoms. In some cases, X-rays  and other tests may be done to check for complications or to check the condition of your stomach and esophagus. TREATMENT  Your caregiver may recommend over-the-counter or prescription medicines to help decrease acid production. Ask your caregiver before starting or adding any new medicines.  HOME CARE INSTRUCTIONS   Change the factors that you can control. Ask your caregiver for guidance concerning weight loss, quitting smoking, and alcohol consumption.  Avoid foods and drinks that make your symptoms  worse, such as:  Caffeine or alcoholic drinks.  Chocolate.  Peppermint or mint flavorings.  Garlic and onions.  Spicy foods.  Citrus fruits, such as oranges, lemons, or limes.  Tomato-based foods such as sauce, chili, salsa, and pizza.  Fried and fatty foods.  Avoid lying down for the 3 hours prior to your bedtime or prior to taking a nap.  Eat small, frequent meals instead of large meals.  Wear loose-fitting clothing. Do not wear anything tight around your waist that causes pressure on your stomach.  Raise the head of your bed 6 to 8 inches with wood blocks to help you sleep. Extra pillows will not help.  Only take over-the-counter or prescription medicines for pain, discomfort, or fever as directed by your caregiver.  Do not take aspirin, ibuprofen, or other nonsteroidal anti-inflammatory drugs (NSAIDs). SEEK IMMEDIATE MEDICAL CARE IF:   You have pain in your arms, neck, jaw, teeth, or back.  Your pain increases or changes in intensity or duration.  You develop nausea, vomiting, or sweating (diaphoresis).  You develop shortness of breath, or you faint.  Your vomit is green, yellow, black, or looks like coffee grounds or blood.  Your stool is red, bloody, or black. These symptoms could be signs of other problems, such as heart disease, gastric bleeding, or esophageal bleeding. MAKE SURE YOU:   Understand these instructions.  Will watch your condition.  Will get help right away if you are not doing well or get worse. Document Released: 05/02/2005 Document Revised: 10/15/2011 Document Reviewed: 02/09/2011 [facility] Patient Information 2015 [place], [place]. This information is not intended to replace advice given to you by your health care provider. Make sure you discuss any questions you have with your health care provider.

## 2014-10-14 LAB — URINE CULTURE
CULTURE: NO GROWTH
Colony Count: NO GROWTH

## 2014-12-20 ENCOUNTER — Emergency Department
Admission: EM | Admit: 2014-12-20 | Discharge: 2014-12-20 | Disposition: A | Discharge status: Home / Self Care | Payer: Self-pay | Attending: Emergency Medicine | Admitting: Emergency Medicine

## 2014-12-20 ENCOUNTER — Encounter: Payer: Self-pay | Admitting: Emergency Medicine

## 2014-12-20 ENCOUNTER — Emergency Department: Payer: Self-pay

## 2014-12-20 DIAGNOSIS — Y9367 Activity, basketball: Secondary | ICD-10-CM | POA: Insufficient documentation

## 2014-12-20 DIAGNOSIS — Y998 Other external cause status: Secondary | ICD-10-CM | POA: Insufficient documentation

## 2014-12-20 DIAGNOSIS — Z880 Allergy status to penicillin: Secondary | ICD-10-CM | POA: Insufficient documentation

## 2014-12-20 DIAGNOSIS — Z79899 Other long term (current) drug therapy: Secondary | ICD-10-CM | POA: Insufficient documentation

## 2014-12-20 DIAGNOSIS — X58XXXA Exposure to other specified factors, initial encounter: Secondary | ICD-10-CM | POA: Insufficient documentation

## 2014-12-20 DIAGNOSIS — Y92838 Other recreation area as the place of occurrence of the external cause: Secondary | ICD-10-CM | POA: Insufficient documentation

## 2014-12-20 DIAGNOSIS — S86912A Strain of unspecified muscle(s) and tendon(s) at lower leg level, left leg, initial encounter: Secondary | ICD-10-CM

## 2014-12-20 MED ORDER — IBUPROFEN 800 MG PO TABS: 800.0000 mg | ORAL_TABLET | Freq: Three times a day (TID) | ORAL | Status: DC | PRN

## 2014-12-20 MED ORDER — HYDROCODONE-ACETAMINOPHEN 5-325 MG PO TABS: 1.0000 | ORAL_TABLET | ORAL | Status: DC | PRN

## 2014-12-20 MED ORDER — CYCLOBENZAPRINE HCL 5 MG PO TABS: 5.0000 mg | ORAL_TABLET | Freq: Three times a day (TID) | ORAL | Status: DC | PRN

## 2014-12-20 NOTE — ED Provider Notes (Signed)
[facility] Emergency Department Provider Note  ____________________________________________  Time seen: Approximately 12:53 PM  I have reviewed the triage vital signs and the nursing notes.   HISTORY  Chief Complaint Knee Pain    HPI Victor Hale is a 39 y.o. male presents to the ER with complaints of left knee pain and swelling for 1 week. Patient states pain is progressively getting worse and each day reports playing basketball when he felt a pop in his knee. Worse with prolonged standing. Better with rest and elevation.   History reviewed. No pertinent past medical history.  Patient Active Problem List   Diagnosis Date Noted  . Gonorrhea in male 07/05/2014  . Constipation 07/05/2014  . Acute anal fissure 07/05/2014  . UTI (urinary tract infection) 07/05/2014  . Sepsis 07/02/2014  . Abdominal pain 07/02/2014    History reviewed. No pertinent past surgical history.  Current Outpatient Rx  Name  Route  Sig  Dispense  Refill  . cyclobenzaprine (FLEXERIL) 5 MG tablet   Oral   Take 1 tablet (5 mg total) by mouth every 8 (eight) hours as needed for muscle spasms.   30 tablet   0   . dicyclomine (BENTYL) 20 MG tablet   Oral   Take 1 tablet (20 mg total) by mouth 2 (two) times daily.   20 tablet   0   . docusate sodium (COLACE) 100 MG capsule   Oral   Take 1 capsule (100 mg total) by mouth 2 (two) times daily. Patient not taking: Reported on 10/12/2014   10 capsule   0   . HYDROcodone-acetaminophen (NORCO) 5-325 MG per tablet   Oral   Take 1 tablet by mouth every 4 (four) hours as needed for moderate pain.   12 tablet   0   . ibuprofen (ADVIL,MOTRIN) 800 MG tablet   Oral   Take 1 tablet (800 mg total) by mouth every 8 (eight) hours as needed.   30 tablet   0   . Multiple Vitamins-Minerals (ONE DAILY MULTIVITAMIN MEN PO)   Oral   Take 1 tablet by mouth daily.         Marland Kitchen omeprazole (PRILOSEC) 20 MG capsule   Oral   Take 1  capsule (20 mg total) by mouth daily.   30 capsule   0   . ondansetron (ZOFRAN ODT) 4 MG disintegrating tablet   Oral   Take 1 tablet (4 mg total) by mouth every 8 (eight) hours as needed for nausea or vomiting.   10 tablet   0     Allergies Penicillins  Family History  Problem Relation Age of Onset  . Hypertension Mother   . Diabetes Mother     Social History History  Substance Use Topics  . Smoking status: Never Smoker   . Smokeless tobacco: Not on file  . Alcohol Use: No    Review of Systems Constitutional: No fever/chills Musculoskeletal: Negative for back pain. Skin: Negative for rash. Neurological: Negative for headaches, focal weakness or numbness.   ____________________________________________   PHYSICAL EXAM:  VITAL SIGNS: ED Triage Vitals  Enc Vitals Group     BP 12/20/14 1145 142/98 mmHg     Pulse Rate 12/20/14 1145 71     Resp 12/20/14 1145 18     Temp 12/20/14 1145 97.8 F (36.6 C)     Temp Source 12/20/14 1145 Oral     SpO2 12/20/14 1145 98 %     Weight 12/20/14 1145 240  lb (108.863 kg)     Height 12/20/14 1145 6\' 2"  (1.88 m)     Head Cir --      Peak Flow --      Pain Score 12/20/14 1146 9     Pain Loc --      Pain Edu? --      Excl. in [place]? --     Constitutional: Alert and oriented. Well appearing and in no acute distress. Musculoskeletal: No lower extremity tenderness nor edema.  No joint effusions. Positive tenderness edema. Pain on both anterior and posterior drawer. Pain on both medial and lateral stress. Point tenderness noted throughout. Neurologic:  Normal speech and language. No gross focal neurologic deficits are appreciated. Speech is normal. No gait instability. Skin:  Skin is warm, dry and intact. No rash noted. Psychiatric: Mood and affect are normal. Speech and behavior are normal.  ____________________________________________   LABS (all labs ordered are listed, but only abnormal results are displayed)  Labs  Reviewed - No data to display ____________________________________________  EKG  None ____________________________________________  RADIOLOGY  Interpreted by radiology, reviewed by myself. Nothing acute. ____________________________________________   PROCEDURES  Procedure(s) performed: None  Critical Care performed: No  ____________________________________________   INITIAL IMPRESSION / ASSESSMENT AND PLAN / ED COURSE  Pertinent labs & imaging results that were available during my care of the patient were reviewed by me and considered in my medical decision making (see chart for details).  Patient given left knee immobilizer with crutches. Instructed to follow up with orthopedics as directed. Return to the ER if symptoms worsen. No other EMC at this time. ____________________________________________   FINAL CLINICAL IMPRESSION(S) / ED DIAGNOSES  Final diagnoses:  Knee strain, left, initial encounter      Arlyss Repress, PA-C 12/20/14 [place], MD 12/21/14 [phone]

## 2014-12-20 NOTE — ED Notes (Signed)
Pt to ed with c/o left knee pain and swelling x 1 week.  Pt states progressively worse pain and swelling each day.  States about 1 week ago was playing basketball and felt a pop in knee and then pain progressed worse each day.

## 2014-12-20 NOTE — ED Notes (Signed)
Reports straining left knee in the gym last week.  Pain getting worse.  +PMS to distal foot.  Limps when ambulating

## 2014-12-20 NOTE — Discharge Instructions (Signed)
Knee Pain The knee is the complex joint between your thigh and your lower leg. It is made up of bones, tendons, ligaments, and cartilage. The bones that make up the knee are:  The femur in the thigh.  The tibia and fibula in the lower leg.  The patella or kneecap riding in the groove on the lower femur. CAUSES  Knee pain is a common complaint with many causes. A few of these causes are:  Injury, such as:  A ruptured ligament or tendon injury.  Torn cartilage.  Medical conditions, such as:  Gout  Arthritis  Infections  Overuse, over training, or overdoing a physical activity. Knee pain can be minor or severe. Knee pain can accompany debilitating injury. Minor knee problems often respond well to self-care measures or get well on their own. More serious injuries may need medical intervention or even surgery. SYMPTOMS The knee is complex. Symptoms of knee problems can vary widely. Some of the problems are:  Pain with movement and weight bearing.  Swelling and tenderness.  Buckling of the knee.  Inability to straighten or extend your knee.  Your knee locks and you cannot straighten it.  Warmth and redness with pain and fever.  Deformity or dislocation of the kneecap. DIAGNOSIS  Determining what is wrong may be very straight forward such as when there is an injury. It can also be challenging because of the complexity of the knee. Tests to make a diagnosis may include:  Your caregiver taking a history and doing a physical exam.  Routine X-rays can be used to rule out other problems. X-rays will not reveal a cartilage tear. Some injuries of the knee can be diagnosed by:  Arthroscopy a surgical technique by which a small video camera is inserted through tiny incisions on the sides of the knee. This procedure is used to examine and repair internal knee joint problems. Tiny instruments can be used during arthroscopy to repair the torn knee cartilage (meniscus).  Arthrography  is a radiology technique. A contrast liquid is directly injected into the knee joint. Internal structures of the knee joint then become visible on X-ray film.  An MRI scan is a non X-ray radiology procedure in which magnetic fields and a computer produce two- or three-dimensional images of the inside of the knee. Cartilage tears are often visible using an MRI scanner. MRI scans have largely replaced arthrography in diagnosing cartilage tears of the knee.  Blood work.  Examination of the fluid that helps to lubricate the knee joint (synovial fluid). This is done by taking a sample out using a needle and a syringe. TREATMENT The treatment of knee problems depends on the cause. Some of these treatments are:  Depending on the injury, proper casting, splinting, surgery, or physical therapy care will be needed.  Give yourself adequate recovery time. Do not overuse your joints. If you begin to get sore during workout routines, back off. Slow down or do fewer repetitions.  For repetitive activities such as cycling or running, maintain your strength and nutrition.  Alternate muscle groups. For example, if you are a weight lifter, work the upper body on one day and the lower body the next.  Either tight or weak muscles do not give the proper support for your knee. Tight or weak muscles do not absorb the stress placed on the knee joint. Keep the muscles surrounding the knee strong.  Take care of mechanical problems.  If you have flat feet, orthotics or special shoes may help.  See your caregiver if you need help. °¨ Arch supports, sometimes with wedges on the inner or outer aspect of the heel, can help. These can shift pressure away from the side of the knee most bothered by osteoarthritis. °¨ A brace called an "unloader" brace also may be used to help ease the pressure on the most arthritic side of the knee. °· If your caregiver has prescribed crutches, braces, wraps or ice, use as directed. The acronym  for this is PRICE. This means protection, rest, ice, compression, and elevation. °· Nonsteroidal anti-inflammatory drugs (NSAIDs), can help relieve pain. But if taken immediately after an injury, they may actually increase swelling. Take NSAIDs with food in your stomach. Stop them if you develop stomach problems. Do not take these if you have a history of ulcers, stomach pain, or bleeding from the bowel. Do not take without your caregiver's approval if you have problems with fluid retention, heart failure, or kidney problems. °· For ongoing knee problems, physical therapy may be helpful. °· Glucosamine and chondroitin are over-the-counter dietary supplements. Both may help relieve the pain of osteoarthritis in the knee. These medicines are different from the usual anti-inflammatory drugs. Glucosamine may decrease the rate of cartilage destruction. °· Injections of a corticosteroid drug into your knee joint may help reduce the symptoms of an arthritis flare-up. They may provide pain relief that lasts a few months. You may have to wait a few months between injections. The injections do have a small increased risk of infection, water retention, and elevated blood sugar levels. °· Hyaluronic acid injected into damaged joints may ease pain and provide lubrication. These injections may work by reducing inflammation. A series of shots may give relief for as long as 6 months. °· Topical painkillers. Applying certain ointments to your skin may help relieve the pain and stiffness of osteoarthritis. Ask your pharmacist for suggestions. Many over the-counter products are approved for temporary relief of arthritis pain. °· In some countries, doctors often prescribe topical NSAIDs for relief of chronic conditions such as arthritis and tendinitis. A review of treatment with NSAID creams found that they worked as well as oral medications but without the serious side effects. °PREVENTION °· Maintain a healthy weight. Extra pounds  put more strain on your joints. °· Get strong, stay limber. Weak muscles are a common cause of knee injuries. Stretching is important. Include flexibility exercises in your workouts. °· Be smart about exercise. If you have osteoarthritis, chronic knee pain or recurring injuries, you may need to change the way you exercise. This does not mean you have to stop being active. If your knees ache after jogging or playing basketball, consider switching to swimming, water aerobics, or other low-impact activities, at least for a few days a week. Sometimes limiting high-impact activities will provide relief. °· Make sure your shoes fit well. Choose footwear that is right for your sport. °· Protect your knees. Use the proper gear for knee-sensitive activities. Use kneepads when playing volleyball or laying carpet. Buckle your seat belt every time you drive. Most shattered kneecaps occur in car accidents. °· Rest when you are tired. °SEEK MEDICAL CARE IF:  °You have knee pain that is continual and does not seem to be getting better.  °SEEK IMMEDIATE MEDICAL CARE IF:  °Your knee joint feels hot to the touch and you have a high fever. °MAKE SURE YOU:  °· Understand these instructions. °· Will watch your condition. °· Will get help right away if you are not   doing well or get worse. Document Released: 05/20/2007 Document Revised: 10/15/2011 Document Reviewed: 05/20/2007 [facility] Patient Information 2015 [place], [place]. This information is not intended to replace advice given to you by your health care provider. Make sure you discuss any questions you have with your health care provider.  Knee Sprain A knee sprain is a tear in one of the strong, fibrous tissues that connect the bones (ligaments) in your knee. The severity of the sprain depends on how much of the ligament is torn. The tear can be either partial or complete. CAUSES  Often, sprains are a result of a fall or injury. The force of the impact causes the fibers of  your ligament to stretch too much. This excess tension causes the fibers of your ligament to tear. SIGNS AND SYMPTOMS  You may have some loss of motion in your knee. Other symptoms include:  Bruising.  Pain in the knee area.  Tenderness of the knee to the touch.  Swelling. DIAGNOSIS  To diagnose a knee sprain, your health care provider will physically examine your knee. Your health care provider may also suggest an X-ray exam of your knee to make sure no bones are broken. TREATMENT  If your ligament is only partially torn, treatment usually involves keeping the knee in a fixed position (immobilization) or bracing your knee for activities that require movement for several weeks. To do this, your health care provider will apply a bandage, cast, or splint to keep your knee from moving and to support your knee during movement until it heals. For a partially torn ligament, the healing process usually takes 4-6 weeks. If your ligament is completely torn, depending on which ligament it is, you may need surgery to reconnect the ligament to the bone or reconstruct it. After surgery, a cast or splint may be applied and will need to stay on your knee for 4-6 weeks while your ligament heals. HOME CARE INSTRUCTIONS  Keep your injured knee elevated to decrease swelling.  To ease pain and swelling, apply ice to the injured area:  Put ice in a plastic bag.  Place a towel between your skin and the bag.  Leave the ice on for 20 minutes, 2-3 times a day.  Only take medicine for pain as directed by your health care provider.  Do not leave your knee unprotected until pain and stiffness go away (usually 4-6 weeks).  If you have a cast or splint, do not allow it to get wet. If you have been instructed not to remove it, cover it with a plastic bag when you shower or bathe. Do not swim.  Your health care provider may suggest exercises for you to do during your recovery to prevent or limit permanent weakness  and stiffness. SEEK IMMEDIATE MEDICAL CARE IF:  Your cast or splint becomes damaged.  Your pain becomes worse.  You have significant pain, swelling, or numbness below the cast or splint. MAKE SURE YOU:  Understand these instructions.  Will watch your condition.  Will get help right away if you are not doing well or get worse. Document Released: 07/23/2005 Document Revised: 05/13/2013 Document Reviewed: 03/04/2013 [facility] Patient Information 2015 [place], [place]. This information is not intended to replace advice given to you by your health care provider. Make sure you discuss any questions you have with your health care provider.

## 2015-10-22 ENCOUNTER — Encounter: Payer: Self-pay | Admitting: Emergency Medicine

## 2015-10-22 ENCOUNTER — Emergency Department
Admission: EM | Admit: 2015-10-22 | Discharge: 2015-10-22 | Disposition: A | Discharge status: Home / Self Care | Payer: Self-pay | Attending: Emergency Medicine | Admitting: Emergency Medicine

## 2015-10-22 ENCOUNTER — Emergency Department: Payer: Self-pay

## 2015-10-22 DIAGNOSIS — R630 Anorexia: Secondary | ICD-10-CM | POA: Insufficient documentation

## 2015-10-22 DIAGNOSIS — R109 Unspecified abdominal pain: Secondary | ICD-10-CM | POA: Insufficient documentation

## 2015-10-22 DIAGNOSIS — J029 Acute pharyngitis, unspecified: Secondary | ICD-10-CM | POA: Insufficient documentation

## 2015-10-22 DIAGNOSIS — B349 Viral infection, unspecified: Secondary | ICD-10-CM | POA: Insufficient documentation

## 2015-10-22 DIAGNOSIS — R079 Chest pain, unspecified: Secondary | ICD-10-CM | POA: Insufficient documentation

## 2015-10-22 DIAGNOSIS — Z880 Allergy status to penicillin: Secondary | ICD-10-CM | POA: Insufficient documentation

## 2015-10-22 DIAGNOSIS — R0602 Shortness of breath: Secondary | ICD-10-CM | POA: Insufficient documentation

## 2015-10-22 LAB — CBC
HCT: 45.1 % (ref 40.0–52.0)
HEMOGLOBIN: 15.7 g/dL (ref 13.0–18.0)
MCH: 31.3 pg (ref 26.0–34.0)
MCHC: 34.8 g/dL (ref 32.0–36.0)
MCV: 89.9 fL (ref 80.0–100.0)
Platelets: 285 10*3/uL (ref 150–440)
RBC: 5.01 MIL/uL (ref 4.40–5.90)
RDW: 12.6 % (ref 11.5–14.5)
WBC: 7.9 10*3/uL (ref 3.8–10.6)

## 2015-10-22 LAB — URINALYSIS COMPLETE WITH MICROSCOPIC (ARMC ONLY)
Bacteria, UA: NONE SEEN
Bilirubin Urine: NEGATIVE
GLUCOSE, UA: NEGATIVE mg/dL
HGB URINE DIPSTICK: NEGATIVE
Leukocytes, UA: NEGATIVE
Nitrite: NEGATIVE
Protein, ur: 30 mg/dL — AB
Specific Gravity, Urine: 1.024 (ref 1.005–1.030)
pH: 5 (ref 5.0–8.0)

## 2015-10-22 LAB — COMPREHENSIVE METABOLIC PANEL
ALBUMIN: 4.6 g/dL (ref 3.5–5.0)
ALK PHOS: 40 U/L (ref 38–126)
ALT: 54 U/L (ref 17–63)
AST: 67 U/L — AB (ref 15–41)
Anion gap: 10 (ref 5–15)
BILIRUBIN TOTAL: 0.9 mg/dL (ref 0.3–1.2)
BUN: 15 mg/dL (ref 6–20)
CALCIUM: 8.9 mg/dL (ref 8.9–10.3)
CO2: 20 mmol/L — ABNORMAL LOW (ref 22–32)
CREATININE: 1.06 mg/dL (ref 0.61–1.24)
Chloride: 101 mmol/L (ref 101–111)
GFR calc Af Amer: >60 mL/min (ref >60)
GFR calc non Af Amer: >60 mL/min (ref >60)
GLUCOSE: 111 mg/dL — AB (ref 65–99)
Potassium: 3.5 mmol/L (ref 3.5–5.1)
Sodium: 131 mmol/L — ABNORMAL LOW (ref 135–145)
TOTAL PROTEIN: 8.5 g/dL — AB (ref 6.5–8.1)

## 2015-10-22 LAB — LIPASE, BLOOD: Lipase: 14 U/L (ref 11–51)

## 2015-10-22 LAB — TROPONIN I: Troponin I: <0.03 ng/mL (ref <0.031)

## 2015-10-22 MED ORDER — HYDROCOD POLST-CPM POLST ER 10-8 MG/5ML PO SUER: 5.0000 mL | Freq: Two times a day (BID) | ORAL | Status: DC

## 2015-10-22 MED ORDER — HYDROMORPHONE HCL 1 MG/ML IJ SOLN
1.0000 mg | Freq: Once | INTRAMUSCULAR | Status: AC
  Administered 2015-10-22: 1 mg via INTRAVENOUS
  Filled 2015-10-22: qty 1

## 2015-10-22 MED ORDER — SODIUM CHLORIDE 0.9 % IV SOLN
Freq: Once | INTRAVENOUS | Status: AC
  Administered 2015-10-22: 14:00:00 via INTRAVENOUS

## 2015-10-22 MED ORDER — FAMOTIDINE IN NACL 20-0.9 MG/50ML-% IV SOLN
20.0000 mg | Freq: Once | INTRAVENOUS | Status: AC
  Administered 2015-10-22: 20 mg via INTRAVENOUS
  Filled 2015-10-22: qty 50

## 2015-10-22 MED ORDER — HYDROCOD POLST-CPM POLST ER 10-8 MG/5ML PO SUER
5.0000 mL | Freq: Once | ORAL | Status: AC
  Administered 2015-10-22: 5 mL via ORAL
  Filled 2015-10-22: qty 5

## 2015-10-22 MED ORDER — FAMOTIDINE 20 MG PO TABS: 20.0000 mg | ORAL_TABLET | Freq: Two times a day (BID) | ORAL | Status: DC

## 2015-10-22 MED ORDER — ONDANSETRON HCL 4 MG/2ML IJ SOLN
4.0000 mg | Freq: Once | INTRAMUSCULAR | Status: AC
  Administered 2015-10-22: 4 mg via INTRAVENOUS
  Filled 2015-10-22: qty 2

## 2015-10-22 MED ORDER — MORPHINE SULFATE (PF) 4 MG/ML IV SOLN
4.0000 mg | Freq: Once | INTRAVENOUS | Status: AC
  Administered 2015-10-22: 4 mg via INTRAVENOUS
  Filled 2015-10-22: qty 1

## 2015-10-22 MED ORDER — ONDANSETRON HCL 4 MG PO TABS: 4.0000 mg | ORAL_TABLET | Freq: Every day | ORAL | Status: DC | PRN

## 2015-10-22 NOTE — ED Notes (Signed)
Pt c/o upper abdominal sharp pain and vomiting since Tuesday.  Reports no appetite and unable to keep liquids down.  Also c/o chest pain and shortness of breath.  States "my heart hurts".  Pt hoarse in triage.  Urinary frequency.

## 2015-10-22 NOTE — ED Provider Notes (Signed)
[facility] Emergency Department Provider Note     Time seen: ----------------------------------------- 1:45 PM on 10/22/2015 -----------------------------------------    I have reviewed the triage vital signs and the nursing notes.   HISTORY  Chief Complaint Emesis; Chest Pain; and Abdominal Pain    HPI Victor Hale is a 40 y.o. male who presents ER for upper abdominal pain and vomiting since Tuesday. Patient reports poor appetite and inability to keep liquids down. He also complains of chest pain and shortness of breath with cough and pain from coughing. Patient describes fever the first 2 days of his illness this is subsequently resolved.   History reviewed. No pertinent past medical history.  Patient Active Problem List   Diagnosis Date Noted  . Gonorrhea in male 07/05/2014  . Constipation 07/05/2014  . Acute anal fissure 07/05/2014  . UTI (urinary tract infection) 07/05/2014  . Sepsis ([place]) 07/02/2014  . Abdominal pain 07/02/2014    History reviewed. No pertinent past surgical history.  Allergies Penicillins  Social History Social History  Substance Use Topics  . Smoking status: Never Smoker   . Smokeless tobacco: None  . Alcohol Use: No    Review of Systems Constitutional: Positive for recent fever Eyes: Negative for visual changes. ENT: Positive for sore throat, hoarse voice Cardiovascular: Negative for chest pain. Respiratory: Positive shortness of breath and cough Gastrointestinal: Positive for abdominal pain, vomiting Genitourinary: Negative for dysuria. Musculoskeletal: Positive for back pain Skin: Negative for rash. Neurological: Positive for headache and weakness  10-point ROS otherwise negative.  ____________________________________________   PHYSICAL EXAM:  VITAL SIGNS: ED Triage Vitals  Enc Vitals Group     BP 10/22/15 1110 174/99 mmHg     Pulse Rate 10/22/15 1110 103     Resp 10/22/15 1110 24     Temp  10/22/15 1110 98.2 F (36.8 C)     Temp Source 10/22/15 1110 Oral     SpO2 10/22/15 1110 98 %     Weight 10/22/15 1110 270 lb (122.471 kg)     Height 10/22/15 1110 6\' 2"  (1.88 m)     Head Cir --      Peak Flow --      Pain Score 10/22/15 1111 10     Pain Loc --      Pain Edu? --      Excl. in [place]? --     Constitutional: Alert and oriented. Well appearing and in no distress. Eyes: Conjunctivae are normal. PERRL. Normal extraocular movements. ENT   Head: Normocephalic and atraumatic.   Nose: No congestion/rhinnorhea.   Mouth/Throat: Mucous membranes are moist.   Neck: No stridor. Cardiovascular: Normal rate, regular rhythm. Normal and symmetric distal pulses are present in all extremities. No murmurs, rubs, or gallops. Respiratory: Normal respiratory effort without tachypnea nor retractions. Breath sounds are clear and equal bilaterally. No wheezes/rales/rhonchi. Gastrointestinal: Nonfocal tenderness, no rebound or guarding. Normal bowel sounds. Musculoskeletal: Nontender with normal range of motion in all extremities. No joint effusions.  No lower extremity tenderness nor edema. Neurologic:  Normal speech and language. No gross focal neurologic deficits are appreciated. Speech is normal. No gait instability. Skin:  Skin is warm, dry and intact. No rash noted. Psychiatric: Mood and affect are normal. Speech and behavior are normal. Patient exhibits appropriate insight and judgment. ____________________________________________  EKG: Interpreted by me. Normal sinus rhythm with a rate of 90 bpm, normal PR interval, normal QRS, normal QT interval. Normal EKG.  ____________________________________________  ED COURSE:  Pertinent labs &  imaging results that were available during my care of the patient were reviewed by me and considered in my medical decision making (see chart for details). Patient's distress, likely viral etiology. Patient was given IV fluids and antiemetics as  well as antitussive agents. ____________________________________________    LABS (pertinent positives/negatives)  Labs Reviewed  COMPREHENSIVE METABOLIC PANEL - Abnormal; Notable for the following:    Sodium 131 (*)    CO2 20 (*)    Glucose, Bld 111 (*)    Total Protein 8.5 (*)    AST 67 (*)    All other components within normal limits  URINALYSIS COMPLETEWITH MICROSCOPIC ([facility] ONLY) - Abnormal; Notable for the following:    Color, Urine YELLOW (*)    APPearance CLEAR (*)    Ketones, ur TRACE (*)    Protein, ur 30 (*)    Squamous Epithelial / LPF 0-5 (*)    All other components within normal limits  CBC  TROPONIN I  LIPASE, BLOOD    RADIOLOGY Images were viewed by me  IMPRESSION: No edema or consolidation.  ____________________________________________  FINAL ASSESSMENT AND PLAN  Viral illness  Plan: Patient with labs and imaging as dictated above. Patient is in no acute distress, has received IV fluids, antiemetics, Pepcid and Tussionex. He is stable for discharge with symptomatically treatment. Likely flulike illness.   Earleen Newport, MD   Earleen Newport, MD 10/22/15 [phone]

## 2015-10-22 NOTE — Discharge Instructions (Signed)
Viral Infections °A viral infection can be caused by different types of viruses. Most viral infections are not serious and resolve on their own. However, some infections may cause severe symptoms and may lead to further complications. °SYMPTOMS °Viruses can frequently cause: °· Minor sore throat. °· Aches and pains. °· Headaches. °· Runny nose. °· Different types of rashes. °· Watery eyes. °· Tiredness. °· Cough. °· Loss of appetite. °· Gastrointestinal infections, resulting in nausea, vomiting, and diarrhea. °These symptoms do not respond to antibiotics because the infection is not caused by bacteria. However, you might catch a bacterial infection following the viral infection. This is sometimes called a "superinfection." Symptoms of such a bacterial infection may include: °· Worsening sore throat with pus and difficulty swallowing. °· Swollen neck glands. °· Chills and a high or persistent fever. °· Severe headache. °· Tenderness over the sinuses. °· Persistent overall ill feeling (malaise), muscle aches, and tiredness (fatigue). °· Persistent cough. °· Yellow, green, or brown mucus production with coughing. °HOME CARE INSTRUCTIONS  °· Only take over-the-counter or prescription medicines for pain, discomfort, diarrhea, or fever as directed by your caregiver. °· Drink enough water and fluids to keep your urine clear or pale yellow. Sports drinks can provide valuable electrolytes, sugars, and hydration. °· Get plenty of rest and maintain proper nutrition. Soups and broths with crackers or rice are fine. °SEEK IMMEDIATE MEDICAL CARE IF:  °· You have severe headaches, shortness of breath, chest pain, neck pain, or an unusual rash. °· You have uncontrolled vomiting, diarrhea, or you are unable to keep down fluids. °· You or your child has an oral temperature above 102° F (38.9° C), not controlled by medicine. °· Your baby is older than 3 months with a rectal temperature of 102° F (38.9° C) or higher. °· Your baby is 3  months old or younger with a rectal temperature of 100.4° F (38° C) or higher. °MAKE SURE YOU:  °· Understand these instructions. °· Will watch your condition. °· Will get help right away if you are not doing well or get worse. °  °This information is not intended to replace advice given to you by your health care provider. Make sure you discuss any questions you have with your health care provider. °  °Document Released: 05/02/2005 Document Revised: 10/15/2011 Document Reviewed: 12/29/2014 °Elsevier Interactive Patient Education ©2016 [employer] ° °

## 2016-03-03 ENCOUNTER — Emergency Department: Payer: Self-pay

## 2016-03-03 ENCOUNTER — Emergency Department
Admission: EM | Admit: 2016-03-03 | Discharge: 2016-03-03 | Disposition: A | Discharge status: Home / Self Care | Payer: Self-pay | Attending: Emergency Medicine | Admitting: Emergency Medicine

## 2016-03-03 DIAGNOSIS — M25462 Effusion, left knee: Secondary | ICD-10-CM | POA: Insufficient documentation

## 2016-03-03 MED ORDER — MELOXICAM 15 MG PO TABS: 15.0000 mg | ORAL_TABLET | Freq: Every day | ORAL | 0 refills | Status: DC

## 2016-03-03 NOTE — ED Triage Notes (Addendum)
Pt c/o left knee pain and swelling for several week, states "it will lock up on Me"

## 2016-03-03 NOTE — ED Provider Notes (Signed)
[facility] Emergency Department Provider Note  ____________________________________________  Time seen: Approximately 7:39 PM  I have reviewed the triage vital signs and the nursing notes.   HISTORY  Chief Complaint Knee Pain    HPI Victor Hale is a 40 y.o. male who presents emergency department complaining of left knee pain. Patient states that he has a several month history of left knee swelling. Patient states that it will intermittently swell more and then slightly resolved. He endorses pain with same. Patient denies any other symptoms to include fevers or chills, nausea or vomiting. Patient is not on any medications for same. He states that he tries to ice the knee when it becomes "bad." Patient denies any significant trauma to the knee.No other complaints at this time.   History reviewed. No pertinent past medical history.  Patient Active Problem List   Diagnosis Date Noted  . Gonorrhea in male 07/05/2014  . Constipation 07/05/2014  . Acute anal fissure 07/05/2014  . UTI (urinary tract infection) 07/05/2014  . Sepsis ([place]) 07/02/2014  . Abdominal pain 07/02/2014    History reviewed. No pertinent surgical history.  Prior to Admission medications   Medication Sig Start Date End Date Taking? Authorizing Provider  chlorpheniramine-HYDROcodone (TUSSIONEX PENNKINETIC ER) 10-8 MG/5ML SUER Take 5 mLs by mouth 2 (two) times daily. 10/22/15   Earleen Newport, MD  cyclobenzaprine (FLEXERIL) 5 MG tablet Take 1 tablet (5 mg total) by mouth every 8 (eight) hours as needed for muscle spasms. 12/20/14   Pierce Crane Beers, PA-C  dicyclomine (BENTYL) 20 MG tablet Take 1 tablet (20 mg total) by mouth 2 (two) times daily. 10/12/14   Waynetta Pean, PA-C  docusate sodium (COLACE) 100 MG capsule Take 1 capsule (100 mg total) by mouth 2 (two) times daily. Patient not taking: Reported on 10/12/2014 07/05/14   Melton Alar, PA-C  famotidine (PEPCID) 20 MG tablet Take 1  tablet (20 mg total) by mouth 2 (two) times daily. 10/22/15   Earleen Newport, MD  HYDROcodone-acetaminophen (NORCO) 5-325 MG per tablet Take 1 tablet by mouth every 4 (four) hours as needed for moderate pain. 12/20/14   Pierce Crane Beers, PA-C  ibuprofen (ADVIL,MOTRIN) 800 MG tablet Take 1 tablet (800 mg total) by mouth every 8 (eight) hours as needed. 12/20/14   Arlyss Repress, PA-C  meloxicam (MOBIC) 15 MG tablet Take 1 tablet (15 mg total) by mouth daily. 03/03/16   Charline Bills Sherlock Nancarrow, PA-C  Multiple Vitamins-Minerals (ONE DAILY MULTIVITAMIN MEN PO) Take 1 tablet by mouth daily.    Historical Provider, MD  omeprazole (PRILOSEC) 20 MG capsule Take 1 capsule (20 mg total) by mouth daily. 10/12/14   Waynetta Pean, PA-C  ondansetron (ZOFRAN ODT) 4 MG disintegrating tablet Take 1 tablet (4 mg total) by mouth every 8 (eight) hours as needed for nausea or vomiting. 10/12/14   Waynetta Pean, PA-C  ondansetron (ZOFRAN) 4 MG tablet Take 1 tablet (4 mg total) by mouth daily as needed for nausea or vomiting. 10/22/15   Earleen Newport, MD    Allergies Penicillins  Family History  Problem Relation Age of Onset  . Hypertension Mother   . Diabetes Mother     Social History Social History  Substance Use Topics  . Smoking status: Never Smoker  . Smokeless tobacco: Never Used  . Alcohol use No     Review of Systems  Constitutional: No fever/chills Cardiovascular: no chest pain. Respiratory: no cough. No SOB. Gastrointestinal: No abdominal pain.  No nausea, no vomiting.  Musculoskeletal: Positive for left knee pain Skin: Negative for rash, abrasions, lacerations, ecchymosis. Neurological: Negative for headaches, focal weakness or numbness. 10-point ROS otherwise negative.  ____________________________________________   PHYSICAL EXAM:  VITAL SIGNS: ED Triage Vitals  Enc Vitals Group     BP 03/03/16 1825 (!) 153/102     Pulse Rate 03/03/16 1825 78     Resp 03/03/16 1825 18     Temp  03/03/16 1825 97.5 F (36.4 C)     Temp Source 03/03/16 1825 Oral     SpO2 03/03/16 1825 96 %     Weight 03/03/16 1825 245 lb (111.1 kg)     Height 03/03/16 1825 6\' 2"  (1.88 m)     Head Circumference --      Peak Flow --      Pain Score 03/03/16 1826 8     Pain Loc --      Pain Edu? --      Excl. in [place]? --      Constitutional: Alert and oriented. Well appearing and in no acute distress. Eyes: Conjunctivae are normal. PERRL. EOMI. Head: Atraumatic. Cardiovascular: Normal rate, regular rhythm. Normal S1 and S2.  Good peripheral circulation. Respiratory: Normal respiratory effort without tachypnea or retractions. Lungs CTAB. Good air entry to the bases with no decreased or absent breath sounds. Musculoskeletal: Full range of motion to all extremities. No gross deformities appreciated. No edema is noted to the left knee upon inspection. Positive ballottement. Knee is not erythematous and not warm to touch. Full range of motion and knee. Varus, valgus, Lachman's, McMurray's is negative. Sensation and pulses intact distally. Neurologic:  Normal speech and language. No gross focal neurologic deficits are appreciated.  Skin:  Skin is warm, dry and intact. No rash noted. Psychiatric: Mood and affect are normal. Speech and behavior are normal. Patient exhibits appropriate insight and judgement.   ____________________________________________   LABS (all labs ordered are listed, but only abnormal results are displayed)  Labs Reviewed - No data to display ____________________________________________  EKG   ____________________________________________  RADIOLOGY Diamantina Providence Birtie Fellman, personally viewed and evaluated these images (plain radiographs) as part of my medical decision making, as well as reviewing the written report by the radiologist.  Dg Knee Complete 4 Views Left  Result Date: 03/03/2016 CLINICAL DATA:  Left knee pain and swelling for 2 weeks. Unable to bear weight. EXAM:  LEFT KNEE - COMPLETE 4+ VIEW COMPARISON:  Left knee pain 12/20/2014 FINDINGS: The knee is located. A moderate-sized joint effusion is present. No focal or acute osseous abnormality is present. Minimal degenerative changes are noted at the patellofemoral compartment. IMPRESSION: 1. Mild effusion without significant osseous abnormality. This raise concern for infection or inflammatory change. Electronically Signed   By: San Morelle M.D.   On: 03/03/2016 19:04   ____________________________________________    PROCEDURES  Procedure(s) performed:    Procedures    Medications - No data to display   ____________________________________________   INITIAL IMPRESSION / ASSESSMENT AND PLAN / ED COURSE  Pertinent labs & imaging results that were available during my care of the patient were reviewed by me and considered in my medical decision making (see chart for details).  Clinical Course    Patient's diagnosis is consistent with R knee effusion. No signs of septic joint. Patient will be discharged home with prescriptions for anti-inflammatories. Patient is to follow up with orthopedics for further assessment and treatment.. Patient is given ED precautions to return to  the ED for any worsening or new symptoms.     ____________________________________________  FINAL CLINICAL IMPRESSION(S) / ED DIAGNOSES  Final diagnoses:  Knee effusion, left      NEW MEDICATIONS STARTED DURING THIS VISIT:  Discharge Medication List as of 03/03/2016  7:53 PM    START taking these medications   Details  meloxicam (MOBIC) 15 MG tablet Take 1 tablet (15 mg total) by mouth daily., Starting Sat 03/03/2016, Print            This chart was dictated using voice recognition software/Dragon. Despite best efforts to proofread, errors can occur which can change the meaning. Any change was purely unintentional.    Darletta Moll, PA-C 03/03/16 2030    Lisa Roca, MD 03/06/16  1106

## 2016-06-11 ENCOUNTER — Emergency Department
Admission: EM | Admit: 2016-06-11 | Discharge: 2016-06-11 | Disposition: A | Discharge status: Home / Self Care | Payer: Self-pay | Attending: Emergency Medicine | Admitting: Emergency Medicine

## 2016-06-11 ENCOUNTER — Encounter: Payer: Self-pay | Admitting: Medical Oncology

## 2016-06-11 DIAGNOSIS — S025XXA Fracture of tooth (traumatic), initial encounter for closed fracture: Secondary | ICD-10-CM

## 2016-06-11 DIAGNOSIS — Z79899 Other long term (current) drug therapy: Secondary | ICD-10-CM | POA: Insufficient documentation

## 2016-06-11 DIAGNOSIS — K0381 Cracked tooth: Secondary | ICD-10-CM | POA: Insufficient documentation

## 2016-06-11 MED ORDER — IBUPROFEN 800 MG PO TABS: 800.0000 mg | ORAL_TABLET | Freq: Three times a day (TID) | ORAL | 0 refills | Status: DC | PRN

## 2016-06-11 MED ORDER — TRAMADOL HCL 50 MG PO TABS: 50.0000 mg | ORAL_TABLET | Freq: Four times a day (QID) | ORAL | 0 refills | Status: DC | PRN

## 2016-06-11 MED ORDER — ERYTHROMYCIN BASE 500 MG PO TABS: 500.0000 mg | ORAL_TABLET | Freq: Four times a day (QID) | ORAL | 0 refills | Status: AC

## 2016-06-11 NOTE — ED Triage Notes (Signed)
Pt reports rt upper dental pain that began yesterday.

## 2016-06-11 NOTE — ED Provider Notes (Signed)
[facility] Emergency Department Provider Note   ____________________________________________   First MD Initiated Contact with Patient 06/11/16 1800     (approximate)  I have reviewed the triage vital signs and the nursing notes.   HISTORY  Chief Complaint Dental Pain    HPI Victor Hale is a 40 y.o. male patient complaining of dental pain secondary to a cracked right upper molar. Patient stated pain increased last night. Patient rating the pain as a 10 over 10. Patient stated no relief with over-the-counter anti-inflammatory medications.Patient describes the pain as "sharp".   History reviewed. No pertinent past medical history.  Patient Active Problem List   Diagnosis Date Noted  . Gonorrhea in male 07/05/2014  . Constipation 07/05/2014  . Acute anal fissure 07/05/2014  . UTI (urinary tract infection) 07/05/2014  . Sepsis ([place]) 07/02/2014  . Abdominal pain 07/02/2014    History reviewed. No pertinent surgical history.  Prior to Admission medications   Medication Sig Start Date End Date Taking? Authorizing Provider  chlorpheniramine-HYDROcodone (TUSSIONEX PENNKINETIC ER) 10-8 MG/5ML SUER Take 5 mLs by mouth 2 (two) times daily. 10/22/15   Earleen Newport, MD  cyclobenzaprine (FLEXERIL) 5 MG tablet Take 1 tablet (5 mg total) by mouth every 8 (eight) hours as needed for muscle spasms. 12/20/14   Pierce Crane Beers, PA-C  dicyclomine (BENTYL) 20 MG tablet Take 1 tablet (20 mg total) by mouth 2 (two) times daily. 10/12/14   Waynetta Pean, PA-C  docusate sodium (COLACE) 100 MG capsule Take 1 capsule (100 mg total) by mouth 2 (two) times daily. Patient not taking: Reported on 10/12/2014 07/05/14   Melton Alar, PA-C  erythromycin base (E-MYCIN) 500 MG tablet Take 1 tablet (500 mg total) by mouth 4 (four) times daily. 06/11/16 06/21/16  Sable Feil, PA-C  famotidine (PEPCID) 20 MG tablet Take 1 tablet (20 mg total) by mouth 2 (two) times daily.  10/22/15   Earleen Newport, MD  HYDROcodone-acetaminophen (NORCO) 5-325 MG per tablet Take 1 tablet by mouth every 4 (four) hours as needed for moderate pain. 12/20/14   Pierce Crane Beers, PA-C  ibuprofen (ADVIL,MOTRIN) 800 MG tablet Take 1 tablet (800 mg total) by mouth every 8 (eight) hours as needed. 12/20/14   Pierce Crane Beers, PA-C  ibuprofen (ADVIL,MOTRIN) 800 MG tablet Take 1 tablet (800 mg total) by mouth every 8 (eight) hours as needed for moderate pain. 06/11/16   Sable Feil, PA-C  meloxicam (MOBIC) 15 MG tablet Take 1 tablet (15 mg total) by mouth daily. 03/03/16   Charline Bills Cuthriell, PA-C  Multiple Vitamins-Minerals (ONE DAILY MULTIVITAMIN MEN PO) Take 1 tablet by mouth daily.    Historical Provider, MD  omeprazole (PRILOSEC) 20 MG capsule Take 1 capsule (20 mg total) by mouth daily. 10/12/14   Waynetta Pean, PA-C  ondansetron (ZOFRAN ODT) 4 MG disintegrating tablet Take 1 tablet (4 mg total) by mouth every 8 (eight) hours as needed for nausea or vomiting. 10/12/14   Waynetta Pean, PA-C  ondansetron (ZOFRAN) 4 MG tablet Take 1 tablet (4 mg total) by mouth daily as needed for nausea or vomiting. 10/22/15   Earleen Newport, MD  traMADol (ULTRAM) 50 MG tablet Take 1 tablet (50 mg total) by mouth every 6 (six) hours as needed. 06/11/16 06/11/17  Sable Feil, PA-C    Allergies Penicillins  Family History  Problem Relation Age of Onset  . Hypertension Mother   . Diabetes Mother     Social  History Social History  Substance Use Topics  . Smoking status: Never Smoker  . Smokeless tobacco: Never Used  . Alcohol use No    Review of Systems Constitutional: No fever/chills Eyes: No visual changes. ENT: No sore throat. Pain right upper molar.  Cardiovascular: Denies chest pain. Respiratory: Denies shortness of breath. Gastrointestinal: No abdominal pain.  No nausea, no vomiting.  No diarrhea.  No constipation. Genitourinary: Negative for dysuria. Musculoskeletal: Negative for  back pain. Skin: Negative for rash. Neurological: Negative for headaches, focal weakness or numbness. Allergic/Immunilogical: Penicillin ____________________________________________   PHYSICAL EXAM:  VITAL SIGNS: ED Triage Vitals  Enc Vitals Group     BP 06/11/16 1739 136/85     Pulse Rate 06/11/16 1739 80     Resp 06/11/16 1739 18     Temp 06/11/16 1739 98.9 F (37.2 C)     Temp Source 06/11/16 1739 Oral     SpO2 06/11/16 1739 97 %     Weight 06/11/16 1740 245 lb (111.1 kg)     Height 06/11/16 1740 6\' 2"  (1.88 m)     Head Circumference --      Peak Flow --      Pain Score 06/11/16 1740 10     Pain Loc --      Pain Edu? --      Excl. in [place]? --     Constitutional: Alert and oriented. Well appearing and in no acute distress. Eyes: Conjunctivae are normal. PERRL. EOMI. Head: Atraumatic. Nose: No congestion/rhinnorhea. Mouth/Throat: Mucous membranes are moist.  Oropharynx non-erythematous.Fractured tooth 14 Neck: No stridor.  No cervical spine tenderness to palpation. Hematological/Lymphatic/Immunilogical: No cervical lymphadenopathy. Cardiovascular: Normal rate, regular rhythm. Grossly normal heart sounds.  Good peripheral circulation. Respiratory: Normal respiratory effort.  No retractions. Lungs CTAB. Gastrointestinal: Soft and nontender. No distention. No abdominal bruits. No CVA tenderness. Musculoskeletal: No lower extremity tenderness nor edema.  No joint effusions. Neurologic:  Normal speech and language. No gross focal neurologic deficits are appreciated. No gait instability. Skin:  Skin is warm, dry and intact. No rash noted. Psychiatric: Mood and affect are normal. Speech and behavior are normal.  ____________________________________________   LABS (all labs ordered are listed, but only abnormal results are displayed)  Labs Reviewed - No data to  display ____________________________________________  EKG   ____________________________________________  RADIOLOGY   ____________________________________________   PROCEDURES  Procedure(s) performed: None  Procedures  Critical Care performed: No  ____________________________________________   INITIAL IMPRESSION / ASSESSMENT AND PLAN / ED COURSE  Pertinent labs & imaging results that were available during my care of the patient were reviewed by me and considered in my medical decision making (see chart for details).  Dental pain secondary to fractured tooth. Patient given discharge Instructions. Patient given a list of dental clinics for follow-up care. Patient given a prescription for erythromycin, tramadol, and ibuprofen.  Clinical Course      ____________________________________________   FINAL CLINICAL IMPRESSION(S) / ED DIAGNOSES  Final diagnoses:  None      NEW MEDICATIONS STARTED DURING THIS VISIT:  New Prescriptions   ERYTHROMYCIN BASE (E-MYCIN) 500 MG TABLET    Take 1 tablet (500 mg total) by mouth 4 (four) times daily.   IBUPROFEN (ADVIL,MOTRIN) 800 MG TABLET    Take 1 tablet (800 mg total) by mouth every 8 (eight) hours as needed for moderate pain.   TRAMADOL (ULTRAM) 50 MG TABLET    Take 1 tablet (50 mg total) by mouth every 6 (six) hours as needed.  Note:  This document was prepared using Dragon voice recognition software and may include unintentional dictation errors.    Sable Feil, PA-C 06/11/16 1809    Lavonia Drafts, MD 06/12/16 1415

## 2016-06-11 NOTE — Discharge Instructions (Signed)
Follow-up for list of dental clinics provided. OPTIONS FOR DENTAL FOLLOW UP CARE  [place] [facility] and [place] [email or web address] [phone])  [facility] [phone])  [place] [phone])  [place] [phone])  [facility] [phone]) This clinic caters to the indigent population and is on a lottery system. Location: [facility], [place], [place], [place] Clinic Hours: Wednesdays from 6pm - 9pm, patients seen by a lottery system. For dates, call or go to [email or web address] Services: Cleanings, fillings and simple extractions. Payment Options: DENTAL WORK IS FREE OF CHARGE. Bring proof of income or support. Best way to get seen: Arrive at 5:15 pm - this is a lottery, NOT first come/first serve, so arriving earlier will not increase your chances of being seen.     [facility] [phone] Select option 1 for emergencies   Location: [facility], [place], [address], [place] Clinic Hours: No walk-ins accepted - call the day before to schedule an appointment. Check in times are 9:30 am and 1:30 pm. Services: Simple extractions, temporary fillings, pulpectomy/pulp debridement, uncomplicated abscess drainage. Payment Options: PAYMENT IS DUE AT THE TIME OF SERVICE.  Fee is usually $100-200, additional surgical procedures (e.g. abscess drainage) may be extra. Cash, checks, Visa/MasterCard accepted.  Can file Medicaid if patient is covered for dental - patient should call case worker to check. No discount for [facility] patients. Best way to get seen: MUST call the day before and get onto the schedule. Can usually be seen the next 1-2 days. No walk-ins accepted.      [place] [phone]   Location: [place], [place] Clinic Hours: M, W, Th, F 8am or 1:30pm, Tues 9a or 1:30 - first come/first served. Services: Simple extractions, temporary fillings, uncomplicated abscess drainage.  You do not need to be an [facility] resident. Payment Options: PAYMENT IS DUE AT THE TIME OF SERVICE. Dental insurance, otherwise sliding scale - bring proof of income or support. Depending on income and treatment needed, cost is usually $50-200. Best way to get seen: Arrive early as it is first come/first served.     [facility] [phone]   Location: [place] [facility] Hours: Mon-Thu 8a-5p Services: Most basic dental services including extractions and fillings. Payment Options: PAYMENT IS DUE AT THE TIME OF SERVICE. Sliding scale, up to 50% off - bring proof if income or support. Medicaid with dental option accepted. Best way to get seen: Call to schedule an appointment, can usually be seen within 2 weeks OR they will try to see walk-ins - show up at [place] or 2p (you may have to wait).     [facility] [place] RESIDENTS ONLY   Location: [facility], [place] [address] [place], [place] [address] Clinic Hours: By appointment only. Monday - Thursday 8am-5pm, Friday 8am-12pm Services: Cleanings, fillings, extractions. Payment Options: PAYMENT IS DUE AT THE TIME OF SERVICE. Cash, Visa or MasterCard. Sliding scale - $30 minimum per service. Best way to get seen: Come in to office, complete packet and make an appointment - need proof of income or support monies for each household member and proof of [facility] residence. Usually takes about a month to get in.     [facility]  [phone] °  °Location: °[address] [place] °Clinic Hours: Walk-in Urgent Care  Dental Services are offered Monday-Friday mornings only. °The numbers of emergencies accepted daily is limited to the number of °providers available. °Maximum 15 - Mondays, Wednesdays & Thursdays °Maximum 10 - Tuesdays & Fridays °Services: °You do not need to be a [place] resident to be seen for a dental emergency. °Emergencies are defined as pain, swelling, abnormal bleeding, or dental trauma. Walkins will receive x-rays if needed. °NOTE: Dental cleaning is not an emergency. °Payment Options: °PAYMENT IS DUE AT THE TIME OF SERVICE. °Minimum co-pay is $40.00 for uninsured patients. °Minimum co-pay is $3.00 for Medicaid with dental coverage. °Dental Insurance is accepted and must be presented at time of visit. °Medicare does not cover dental. °Forms of payment: Cash, credit card, checks. °Best way to get seen: °If not previously registered with the clinic, walk-in dental registration begins at 7:15 am and is on a first come/first serve basis. °If previously registered with the clinic, call to make an appointment. °  °  °[facility] °[phone] °[place] RESIDENTS ONLY °  °Location: °[address], [place], [place] °Clinic Hours: °Mon-Thu 10a-2p °Services: Extractions only! °Payment Options: °FREE (donations accepted) - bring proof of income or support °Best way to get seen: °Call and schedule an appointment OR come at 8am on the 1st Monday of every month (except for holidays) when it is first come/first served. °  °  °[facility] °[phone] °  °Location: °[address], [place] °Clinic Hours: °Friday mornings °Services, Payment Options, Best way to get seen: °Call for info ° °

## 2016-09-11 ENCOUNTER — Encounter: Payer: Self-pay | Admitting: Emergency Medicine

## 2016-09-11 ENCOUNTER — Emergency Department: Payer: Self-pay

## 2016-09-11 ENCOUNTER — Emergency Department
Admission: EM | Admit: 2016-09-11 | Discharge: 2016-09-11 | Disposition: A | Discharge status: Home / Self Care | Payer: Self-pay | Attending: Student in an Organized Health Care Education/Training Program | Admitting: Student in an Organized Health Care Education/Training Program

## 2016-09-11 DIAGNOSIS — R05 Cough: Secondary | ICD-10-CM | POA: Insufficient documentation

## 2016-09-11 DIAGNOSIS — R109 Unspecified abdominal pain: Secondary | ICD-10-CM

## 2016-09-11 DIAGNOSIS — N200 Calculus of kidney: Secondary | ICD-10-CM | POA: Insufficient documentation

## 2016-09-11 DIAGNOSIS — Z79899 Other long term (current) drug therapy: Secondary | ICD-10-CM | POA: Insufficient documentation

## 2016-09-11 HISTORY — DX: Fatty (change of) liver, not elsewhere classified: K76.0

## 2016-09-11 LAB — BASIC METABOLIC PANEL
ANION GAP: 7 (ref 5–15)
BUN: 11 mg/dL (ref 6–20)
CO2: 23 mmol/L (ref 22–32)
Calcium: 9.2 mg/dL (ref 8.9–10.3)
Chloride: 107 mmol/L (ref 101–111)
Creatinine, Ser: 0.82 mg/dL (ref 0.61–1.24)
GFR calc Af Amer: >60 mL/min (ref >60)
Glucose, Bld: 93 mg/dL (ref 65–99)
POTASSIUM: 4 mmol/L (ref 3.5–5.1)
SODIUM: 137 mmol/L (ref 135–145)

## 2016-09-11 LAB — CBC
HEMATOCRIT: 41.3 % (ref 40.0–52.0)
Hemoglobin: 14.7 g/dL (ref 13.0–18.0)
MCH: 31.8 pg (ref 26.0–34.0)
MCHC: 35.5 g/dL (ref 32.0–36.0)
MCV: 89.7 fL (ref 80.0–100.0)
PLATELETS: 264 10*3/uL (ref 150–440)
RBC: 4.6 MIL/uL (ref 4.40–5.90)
RDW: 12.8 % (ref 11.5–14.5)
WBC: 6.8 10*3/uL (ref 3.8–10.6)

## 2016-09-11 LAB — URINALYSIS, COMPLETE (UACMP) WITH MICROSCOPIC
BACTERIA UA: NONE SEEN
Bilirubin Urine: NEGATIVE
Glucose, UA: NEGATIVE mg/dL
HGB URINE DIPSTICK: NEGATIVE
Ketones, ur: NEGATIVE mg/dL
Nitrite: NEGATIVE
PROTEIN: NEGATIVE mg/dL
Specific Gravity, Urine: 1.015 (ref 1.005–1.030)
pH: 5 (ref 5.0–8.0)

## 2016-09-11 LAB — HEPATIC FUNCTION PANEL
ALBUMIN: 4.3 g/dL (ref 3.5–5.0)
ALT: 38 U/L (ref 17–63)
AST: 32 U/L (ref 15–41)
Alkaline Phosphatase: 40 U/L (ref 38–126)
BILIRUBIN TOTAL: 0.7 mg/dL (ref 0.3–1.2)
Bilirubin, Direct: 0.1 mg/dL (ref 0.1–0.5)
Indirect Bilirubin: 0.6 mg/dL (ref 0.3–0.9)
TOTAL PROTEIN: 7.8 g/dL (ref 6.5–8.1)

## 2016-09-11 MED ORDER — MORPHINE SULFATE (PF) 4 MG/ML IV SOLN
INTRAVENOUS | Status: AC
  Administered 2016-09-11: 4 mg via INTRAMUSCULAR
  Filled 2016-09-11: qty 1

## 2016-09-11 MED ORDER — DIAZEPAM 5 MG PO TABS
ORAL_TABLET | ORAL | Status: AC
  Administered 2016-09-11: 15:00:00
  Filled 2016-09-11: qty 1

## 2016-09-11 MED ORDER — MORPHINE SULFATE (PF) 4 MG/ML IV SOLN
6.0000 mg | INTRAVENOUS | Status: DC | PRN
  Administered 2016-09-11: 4 mg via INTRAMUSCULAR

## 2016-09-11 MED ORDER — OXYCODONE-ACETAMINOPHEN 5-325 MG PO TABS: 1.0000 | ORAL_TABLET | Freq: Four times a day (QID) | ORAL | 0 refills | Status: AC | PRN

## 2016-09-11 MED ORDER — DIAZEPAM 5 MG PO TABS
5.0000 mg | ORAL_TABLET | Freq: Once | ORAL | Status: AC
  Administered 2016-09-11: 5 mg via ORAL

## 2016-09-11 MED ORDER — NAPROXEN 375 MG PO TABS: 375.0000 mg | ORAL_TABLET | Freq: Two times a day (BID) | ORAL | 0 refills | Status: DC

## 2016-09-11 MED ORDER — CYCLOBENZAPRINE HCL 10 MG PO TABS: 10.0000 mg | ORAL_TABLET | Freq: Three times a day (TID) | ORAL | 0 refills | Status: DC | PRN

## 2016-09-11 MED ORDER — MORPHINE SULFATE (PF) 2 MG/ML IV SOLN
INTRAVENOUS | Status: AC
  Administered 2016-09-11: 2 mg via INTRAMUSCULAR
  Filled 2016-09-11: qty 1

## 2016-09-11 NOTE — Discharge Instructions (Signed)

## 2016-09-11 NOTE — ED Notes (Signed)
Pt still c/o pain, however, states it has decreased. Able to talk in full sentences without stopping due to pain. Appears in no distress.

## 2016-09-11 NOTE — ED Provider Notes (Signed)
[facility] Emergency Department Provider Note    None    (approximate)  I have reviewed the triage vital signs and the nursing notes.   HISTORY  Chief Complaint Flank Pain    HPI Victor Hale is a 41 y.o. male who presents with several weeks of persistently worsening right flank pain. No fevers or chills. States is also having increased urinary frequency. No hematuria. Denies any dysuria but states he is going to the bathroom multiple times throughout the day. No change in oral hydration. No nausea or vomiting. States the pain is a stabbing discomfort that is worse with movement. No heavy lifting. No numbness or tingling. No trauma. Has had a cough but no productive phlegm. He does not smoke. Does not drink alcohol.  No personal or family h/o clots or DVT.   Past Medical History:  Diagnosis Date  . Fatty liver    Family History  Problem Relation Age of Onset  . Hypertension Mother   . Diabetes Mother    History reviewed. No pertinent surgical history. Patient Active Problem List   Diagnosis Date Noted  . Gonorrhea in male 07/05/2014  . Constipation 07/05/2014  . Acute anal fissure 07/05/2014  . UTI (urinary tract infection) 07/05/2014  . Sepsis ([place]) 07/02/2014  . Abdominal pain 07/02/2014      Prior to Admission medications   Medication Sig Start Date End Date Taking? Authorizing Provider  chlorpheniramine-HYDROcodone (TUSSIONEX PENNKINETIC ER) 10-8 MG/5ML SUER Take 5 mLs by mouth 2 (two) times daily. 10/22/15   Earleen Newport, MD  cyclobenzaprine (FLEXERIL) 5 MG tablet Take 1 tablet (5 mg total) by mouth every 8 (eight) hours as needed for muscle spasms. 12/20/14   Pierce Crane Beers, PA-C  dicyclomine (BENTYL) 20 MG tablet Take 1 tablet (20 mg total) by mouth 2 (two) times daily. 10/12/14   Waynetta Pean, PA-C  docusate sodium (COLACE) 100 MG capsule Take 1 capsule (100 mg total) by mouth 2 (two) times daily. Patient not taking: Reported  on 10/12/2014 07/05/14   Melton Alar, PA-C  famotidine (PEPCID) 20 MG tablet Take 1 tablet (20 mg total) by mouth 2 (two) times daily. 10/22/15   Earleen Newport, MD  HYDROcodone-acetaminophen (NORCO) 5-325 MG per tablet Take 1 tablet by mouth every 4 (four) hours as needed for moderate pain. 12/20/14   Pierce Crane Beers, PA-C  ibuprofen (ADVIL,MOTRIN) 800 MG tablet Take 1 tablet (800 mg total) by mouth every 8 (eight) hours as needed. 12/20/14   Pierce Crane Beers, PA-C  ibuprofen (ADVIL,MOTRIN) 800 MG tablet Take 1 tablet (800 mg total) by mouth every 8 (eight) hours as needed for moderate pain. 06/11/16   Sable Feil, PA-C  meloxicam (MOBIC) 15 MG tablet Take 1 tablet (15 mg total) by mouth daily. 03/03/16   Charline Bills Cuthriell, PA-C  Multiple Vitamins-Minerals (ONE DAILY MULTIVITAMIN MEN PO) Take 1 tablet by mouth daily.    Historical Provider, MD  omeprazole (PRILOSEC) 20 MG capsule Take 1 capsule (20 mg total) by mouth daily. 10/12/14   Waynetta Pean, PA-C  ondansetron (ZOFRAN ODT) 4 MG disintegrating tablet Take 1 tablet (4 mg total) by mouth every 8 (eight) hours as needed for nausea or vomiting. 10/12/14   Waynetta Pean, PA-C  ondansetron (ZOFRAN) 4 MG tablet Take 1 tablet (4 mg total) by mouth daily as needed for nausea or vomiting. 10/22/15   Earleen Newport, MD  traMADol (ULTRAM) 50 MG tablet Take 1 tablet (50  mg total) by mouth every 6 (six) hours as needed. 06/11/16 06/11/17  Sable Feil, PA-C    Allergies Penicillins    Social History Social History  Substance Use Topics  . Smoking status: Never Smoker  . Smokeless tobacco: Never Used  . Alcohol use No    Review of Systems Patient denies headaches, rhinorrhea, blurry vision, numbness, shortness of breath, chest pain, edema, cough, abdominal pain, nausea, vomiting, diarrhea, dysuria, fevers, rashes or hallucinations unless otherwise stated above in HPI. ____________________________________________   PHYSICAL  EXAM:  VITAL SIGNS: Vitals:   09/11/16 1124  BP: 138/79  Pulse: 80  Resp: 16  Temp: 98.5 F (36.9 C)    Constitutional: Alert and oriented. Well appearing and in no acute distress. Eyes: Conjunctivae are normal. PERRL. EOMI. Head: Atraumatic. Nose: No congestion/rhinnorhea. Mouth/Throat: Mucous membranes are moist.  Oropharynx non-erythematous. Neck: No stridor. Painless ROM. No cervical spine tenderness to palpation Hematological/Lymphatic/Immunilogical: No cervical lymphadenopathy. Cardiovascular: Normal rate, regular rhythm. Grossly normal heart sounds.  Good peripheral circulation. Respiratory: Normal respiratory effort.  No retractions. Lungs CTAB. Gastrointestinal: Soft and nontender. No distention. No abdominal bruits. + right CVA tenderness. Musculoskeletal: No lower extremity tenderness nor edema.  No joint effusions. Neurologic:  Normal speech and language. No gross focal neurologic deficits are appreciated. No gait instability. Skin:  Skin is warm, dry and intact. No rash noted. Psychiatric: Mood and affect are normal. Speech and behavior are normal. ____________________________________________   LABS (all labs ordered are listed, but only abnormal results are displayed)  Results for orders placed or performed during the hospital encounter of 09/11/16 (from the past 24 hour(s))  Urinalysis, Complete w Microscopic     Status: Abnormal   Collection Time: 09/11/16 11:34 AM  Result Value Ref Range   Color, Urine YELLOW (A) YELLOW   APPearance CLEAR (A) CLEAR   Specific Gravity, Urine 1.015 1.005 - 1.030   pH 5.0 5.0 - 8.0   Glucose, UA NEGATIVE NEGATIVE mg/dL   Hgb urine dipstick NEGATIVE NEGATIVE   Bilirubin Urine NEGATIVE NEGATIVE   Ketones, ur NEGATIVE NEGATIVE mg/dL   Protein, ur NEGATIVE NEGATIVE mg/dL   Nitrite NEGATIVE NEGATIVE   Leukocytes, UA TRACE (A) NEGATIVE   RBC / HPF 0-5 0 - 5 RBC/hpf   WBC, UA 0-5 0 - 5 WBC/hpf   Bacteria, UA NONE SEEN NONE SEEN    Squamous Epithelial / LPF 0-5 (A) NONE SEEN   Mucous PRESENT   Basic metabolic panel     Status: None   Collection Time: 09/11/16 11:34 AM  Result Value Ref Range   Sodium 137 135 - 145 mmol/L   Potassium 4.0 3.5 - 5.1 mmol/L   Chloride 107 101 - 111 mmol/L   CO2 23 22 - 32 mmol/L   Glucose, Bld 93 65 - 99 mg/dL   BUN 11 6 - 20 mg/dL   Creatinine, Ser 0.82 0.61 - 1.24 mg/dL   Calcium 9.2 8.9 - 10.3 mg/dL   GFR calc non Af Amer >60 >60 mL/min   GFR calc Af Amer >60 >60 mL/min   Anion gap 7 5 - 15  CBC     Status: None   Collection Time: 09/11/16 11:34 AM  Result Value Ref Range   WBC 6.8 3.8 - 10.6 K/uL   RBC 4.60 4.40 - 5.90 MIL/uL   Hemoglobin 14.7 13.0 - 18.0 g/dL   HCT 41.3 40.0 - 52.0 %   MCV 89.7 80.0 - 100.0 fL   MCH 31.8  26.0 - 34.0 pg   MCHC 35.5 32.0 - 36.0 g/dL   RDW 12.8 11.5 - 14.5 %   Platelets 264 150 - 440 K/uL   ____________________________________________  EKG____________________________________________  RADIOLOGY  I personally reviewed all radiographic images ordered to evaluate for the above acute complaints and reviewed radiology reports and findings.  These findings were personally discussed with the patient.  Please see medical record for radiology report.  ____________________________________________   PROCEDURES  Procedure(s) performed:  Procedures    Critical Care performed: no ____________________________________________   INITIAL IMPRESSION / ASSESSMENT AND PLAN / ED COURSE  Pertinent labs & imaging results that were available during my care of the patient were reviewed by me and considered in my medical decision making (see chart for details).  DDX: stone, pyelo, fracture, shingles, cholelithiasis, diabetes, dehydration, PE  Sabien Makarewicz is a 41 y.o. who presents to the ED p/w right flank pain. No fevers, no systemic symptoms. + urinary symptoms. Denies trauma or injury. Afebrile in ED. Exam as above. Flank TTP, otherwise  abdominal exam is benign. No peritoneal signs. Possible kidney stone, cystitis, or pyelonephritis.   Patient is low risk Wells and PERC negative.  Checking urine. UA with no hematuria or pyuria CT Stone with Left-sided nephrolithiasis with moderate hydronephrosis. There is no evidence of AAA. No evidence of colitis. There is no evidence of right rib fracture. No hematoma or free fluid in the abdomen. His abdominal exam soft benign. Clinical picture is not consistent with appendicitis, diverticulitis, pancreatitis, cholecystitis, bowel perforation, aortic dissection, splenic injury or acute abdominal process at this time.   ----------------------------------------- 2:52 PM on 09/11/2016 -----------------------------------------   Pain improved, tolerating PO. Repeat ABD exam benign, will plan supportive treatment and early follow up for recheck.       ____________________________________________   FINAL CLINICAL IMPRESSION(S) / ED DIAGNOSES  Final diagnoses:  Right flank pain  Kidney stone      NEW MEDICATIONS STARTED DURING THIS VISIT:  New Prescriptions   No medications on file     Note:  This document was prepared using Dragon voice recognition software and may include unintentional dictation errors.    Merlyn Lot, MD 09/11/16 1452

## 2016-09-11 NOTE — ED Triage Notes (Signed)
Pt to ED c/o right flank pain for several weeks, states urinary frequency but no pain or pressure with urination.  States mild pain to left flank as well.  Denies n/v/d.

## 2016-09-17 ENCOUNTER — Encounter (HOSPITAL_COMMUNITY): Payer: Self-pay | Admitting: Emergency Medicine

## 2016-09-17 ENCOUNTER — Emergency Department (HOSPITAL_COMMUNITY)
Admission: EM | Admit: 2016-09-17 | Discharge: 2016-09-17 | Disposition: A | Discharge status: Home / Self Care | Payer: Self-pay | Attending: Emergency Medicine | Admitting: Emergency Medicine

## 2016-09-17 DIAGNOSIS — R109 Unspecified abdominal pain: Secondary | ICD-10-CM | POA: Insufficient documentation

## 2016-09-17 DIAGNOSIS — Z79899 Other long term (current) drug therapy: Secondary | ICD-10-CM | POA: Insufficient documentation

## 2016-09-17 DIAGNOSIS — J020 Streptococcal pharyngitis: Secondary | ICD-10-CM | POA: Insufficient documentation

## 2016-09-17 LAB — URINALYSIS, ROUTINE W REFLEX MICROSCOPIC
BILIRUBIN URINE: NEGATIVE
Glucose, UA: NEGATIVE mg/dL
Ketones, ur: NEGATIVE mg/dL
NITRITE: NEGATIVE
PH: 5 (ref 5.0–8.0)
Protein, ur: NEGATIVE mg/dL
SPECIFIC GRAVITY, URINE: 1.024 (ref 1.005–1.030)

## 2016-09-17 LAB — CBC WITH DIFFERENTIAL/PLATELET
Basophils Absolute: 0 10*3/uL (ref 0.0–0.1)
Basophils Relative: 0 %
EOS ABS: 0 10*3/uL (ref 0.0–0.7)
Eosinophils Relative: 0 %
HCT: 40.5 % (ref 39.0–52.0)
HEMOGLOBIN: 13.7 g/dL (ref 13.0–17.0)
LYMPHS ABS: 2.1 10*3/uL (ref 0.7–4.0)
Lymphocytes Relative: 10 %
MCH: 30.6 pg (ref 26.0–34.0)
MCHC: 33.8 g/dL (ref 30.0–36.0)
MCV: 90.6 fL (ref 78.0–100.0)
MONOS PCT: 8 %
Monocytes Absolute: 1.6 10*3/uL — ABNORMAL HIGH (ref 0.1–1.0)
Neutro Abs: 17.4 10*3/uL — ABNORMAL HIGH (ref 1.7–7.7)
Neutrophils Relative %: 82 %
PLATELETS: 226 10*3/uL (ref 150–400)
RBC: 4.47 MIL/uL (ref 4.22–5.81)
RDW: 12.8 % (ref 11.5–15.5)
WBC: 21.1 10*3/uL — ABNORMAL HIGH (ref 4.0–10.5)

## 2016-09-17 LAB — COMPREHENSIVE METABOLIC PANEL
ALK PHOS: 42 U/L (ref 38–126)
ALT: 32 U/L (ref 17–63)
ANION GAP: 10 (ref 5–15)
AST: 24 U/L (ref 15–41)
Albumin: 4.1 g/dL (ref 3.5–5.0)
BUN: 11 mg/dL (ref 6–20)
CALCIUM: 9.3 mg/dL (ref 8.9–10.3)
CO2: 21 mmol/L — AB (ref 22–32)
Chloride: 107 mmol/L (ref 101–111)
Creatinine, Ser: 1.09 mg/dL (ref 0.61–1.24)
Glucose, Bld: 96 mg/dL (ref 65–99)
Potassium: 3.3 mmol/L — ABNORMAL LOW (ref 3.5–5.1)
SODIUM: 138 mmol/L (ref 135–145)
Total Bilirubin: 0.8 mg/dL (ref 0.3–1.2)
Total Protein: 7.5 g/dL (ref 6.5–8.1)

## 2016-09-17 LAB — RAPID STREP SCREEN (MED CTR MEBANE ONLY): STREPTOCOCCUS, GROUP A SCREEN (DIRECT): POSITIVE — AB

## 2016-09-17 MED ORDER — HYDROMORPHONE HCL 2 MG/ML IJ SOLN
1.0000 mg | Freq: Once | INTRAMUSCULAR | Status: AC
  Administered 2016-09-17: 1 mg via INTRAVENOUS
  Filled 2016-09-17: qty 1

## 2016-09-17 MED ORDER — ONDANSETRON 4 MG PO TBDP: ORAL_TABLET | ORAL | 0 refills | Status: DC

## 2016-09-17 MED ORDER — HYDROCODONE-ACETAMINOPHEN 5-325 MG PO TABS: 1.0000 | ORAL_TABLET | Freq: Four times a day (QID) | ORAL | 0 refills | Status: DC | PRN

## 2016-09-17 MED ORDER — CEPHALEXIN 500 MG PO CAPS: 500.0000 mg | ORAL_CAPSULE | Freq: Four times a day (QID) | ORAL | 0 refills | Status: DC

## 2016-09-17 MED ORDER — ONDANSETRON HCL 4 MG/2ML IJ SOLN
4.0000 mg | Freq: Once | INTRAMUSCULAR | Status: AC
  Administered 2016-09-17: 4 mg via INTRAVENOUS
  Filled 2016-09-17: qty 2

## 2016-09-17 MED ORDER — SODIUM CHLORIDE 0.9 % IV BOLUS (SEPSIS)
1000.0000 mL | Freq: Once | INTRAVENOUS | Status: AC
  Administered 2016-09-17: 1000 mL via INTRAVENOUS

## 2016-09-17 NOTE — Discharge Instructions (Signed)
Follow up if not improving.  Drink plenty of fluids °

## 2016-09-17 NOTE — ED Notes (Signed)
Pt c/o pain in "kidneys that has been going on for a few months". Pt reports having hx of cyst on kidneys. Denies hematuria.

## 2016-09-17 NOTE — ED Triage Notes (Signed)
Pt sts pain in legs and HA and sore throat; pt sts pain in back as well

## 2016-09-17 NOTE — ED Notes (Signed)
Pt reports headache that has been unrelieved by tylenol or ibuprofen.

## 2016-09-17 NOTE — ED Provider Notes (Signed)
[facility] DEPT Provider Note   CSN: [id number] Arrival date & time: 09/17/16  [id number]   By signing my name below, I, [name], attest that this documentation has been prepared under the direction and in the presence of Milton Ferguson, MD . Electronically Signed: [name], ED Scribe. 09/17/2016. 12:13 PM.   History   Chief Complaint Chief Complaint  Patient presents with  . Generalized Body Aches    The history is provided by the patient. No language interpreter was used.  Flank Pain  This is a new problem. The current episode started more than 1 week ago. The problem occurs constantly. The problem has not changed since onset.Associated symptoms include headaches. Pertinent negatives include no chest pain and no abdominal pain. Nothing aggravates the symptoms. Nothing relieves the symptoms. He has tried nothing for the symptoms.   HPI Comments:  Victor Hale is a 41 y.o. male who presents to the Emergency Department with multiple complaints over the past 1 month. Pt states that he is having sharp pain radiating from his bilateral flanks down both of his legs. Pt complains of associated urinary frequency, headaches, intermittent sore/swollen throat, difficulty swallowing, fever. Pt reports that the fever has been intermittent over the past month. No alleviating factors noted. Pt denies other associated symptoms.    Past Medical History:  Diagnosis Date  . Fatty liver     Patient Active Problem List   Diagnosis Date Noted  . Gonorrhea in male 07/05/2014  . Constipation 07/05/2014  . Acute anal fissure 07/05/2014  . UTI (urinary tract infection) 07/05/2014  . Sepsis ([place]) 07/02/2014  . Abdominal pain 07/02/2014    History reviewed. No pertinent surgical history.     Home Medications    Prior to Admission medications   Medication Sig Start Date End Date Taking? Authorizing Provider  chlorpheniramine-HYDROcodone (TUSSIONEX PENNKINETIC ER) 10-8 MG/5ML  SUER Take 5 mLs by mouth 2 (two) times daily. 10/22/15   Earleen Newport, MD  cyclobenzaprine (FLEXERIL) 10 MG tablet Take 1 tablet (10 mg total) by mouth 3 (three) times daily as needed for muscle spasms. 09/11/16   Merlyn Lot, MD  cyclobenzaprine (FLEXERIL) 5 MG tablet Take 1 tablet (5 mg total) by mouth every 8 (eight) hours as needed for muscle spasms. 12/20/14   Pierce Crane Beers, PA-C  dicyclomine (BENTYL) 20 MG tablet Take 1 tablet (20 mg total) by mouth 2 (two) times daily. 10/12/14   Waynetta Pean, PA-C  docusate sodium (COLACE) 100 MG capsule Take 1 capsule (100 mg total) by mouth 2 (two) times daily. Patient not taking: Reported on 10/12/2014 07/05/14   Melton Alar, PA-C  famotidine (PEPCID) 20 MG tablet Take 1 tablet (20 mg total) by mouth 2 (two) times daily. 10/22/15   Earleen Newport, MD  HYDROcodone-acetaminophen (NORCO) 5-325 MG per tablet Take 1 tablet by mouth every 4 (four) hours as needed for moderate pain. 12/20/14   Pierce Crane Beers, PA-C  ibuprofen (ADVIL,MOTRIN) 800 MG tablet Take 1 tablet (800 mg total) by mouth every 8 (eight) hours as needed. 12/20/14   Pierce Crane Beers, PA-C  ibuprofen (ADVIL,MOTRIN) 800 MG tablet Take 1 tablet (800 mg total) by mouth every 8 (eight) hours as needed for moderate pain. 06/11/16   Sable Feil, PA-C  meloxicam (MOBIC) 15 MG tablet Take 1 tablet (15 mg total) by mouth daily. 03/03/16   Charline Bills Cuthriell, PA-C  Multiple Vitamins-Minerals (ONE DAILY MULTIVITAMIN MEN PO) Take 1 tablet by mouth  daily.    Historical Provider, MD  naproxen (NAPROSYN) 375 MG tablet Take 1 tablet (375 mg total) by mouth 2 (two) times daily with a meal. 09/11/16   Merlyn Lot, MD  omeprazole (PRILOSEC) 20 MG capsule Take 1 capsule (20 mg total) by mouth daily. 10/12/14   Waynetta Pean, PA-C  ondansetron (ZOFRAN ODT) 4 MG disintegrating tablet Take 1 tablet (4 mg total) by mouth every 8 (eight) hours as needed for nausea or vomiting. 10/12/14   Waynetta Pean,  PA-C  ondansetron (ZOFRAN) 4 MG tablet Take 1 tablet (4 mg total) by mouth daily as needed for nausea or vomiting. 10/22/15   Earleen Newport, MD  oxyCODONE-acetaminophen (ROXICET) 5-325 MG tablet Take 1 tablet by mouth every 6 (six) hours as needed. 09/11/16 09/11/17  Merlyn Lot, MD  traMADol (ULTRAM) 50 MG tablet Take 1 tablet (50 mg total) by mouth every 6 (six) hours as needed. 06/11/16 06/11/17  Sable Feil, PA-C    Family History Family History  Problem Relation Age of Onset  . Hypertension Mother   . Diabetes Mother     Social History Social History  Substance Use Topics  . Smoking status: Never Smoker  . Smokeless tobacco: Never Used  . Alcohol use No     Allergies   Penicillins   Review of Systems Review of Systems  Constitutional: Positive for fever. Negative for appetite change and fatigue.  HENT: Positive for sore throat and trouble swallowing. Negative for congestion, ear discharge and sinus pressure.   Eyes: Negative for discharge.  Respiratory: Negative for cough.   Cardiovascular: Negative for chest pain.  Gastrointestinal: Negative for abdominal pain and diarrhea.  Genitourinary: Positive for flank pain and frequency. Negative for hematuria.  Musculoskeletal: Positive for myalgias. Negative for back pain.  Skin: Negative for rash.  Neurological: Positive for headaches. Negative for seizures.  Psychiatric/Behavioral: Negative for hallucinations.     Physical Exam Updated Vital Signs BP 149/92 (BP Location: Right Arm)   Pulse 99   Temp 99.6 F (37.6 C) (Oral)   Resp 20   SpO2 95%   Physical Exam  Constitutional: He is oriented to person, place, and time. He appears well-developed.  HENT:  Head: Normocephalic.  Pharynx inflamed  Eyes: Conjunctivae and EOM are normal. No scleral icterus.  Neck: Neck supple. No thyromegaly present.  tender lymph nodes bilaterally.  Cardiovascular: Normal rate and regular rhythm.  Exam reveals no gallop and  no friction rub.   No murmur heard. Pulmonary/Chest: No stridor. He has no wheezes. He has no rales. He exhibits no tenderness.  Abdominal: He exhibits no distension. There is no tenderness. There is no rebound.  Musculoskeletal: Normal range of motion. He exhibits no edema.  Tender to bilateral flank area.   Lymphadenopathy:    He has no cervical adenopathy.  Neurological: He is oriented to person, place, and time. He exhibits normal muscle tone. Coordination normal.  Skin: No rash noted. No erythema.  Psychiatric: He has a normal mood and affect. His behavior is normal.     ED Treatments / Results  DIAGNOSTIC STUDIES:  Oxygen Saturation is 95% on Ra, adequate by my interpretation.    COORDINATION OF CARE:  12:13 PM Discussed treatment plan with pt at bedside and pt agreed to plan.   Labs (all labs ordered are listed, but only abnormal results are displayed) Labs Reviewed  RAPID STREP SCREEN (NOT AT [facility])  CBC WITH DIFFERENTIAL/PLATELET  COMPREHENSIVE METABOLIC PANEL  URINALYSIS, ROUTINE W  REFLEX MICROSCOPIC    EKG  EKG Interpretation None       Radiology No results found.  Procedures Procedures (including critical care time)  Medications Ordered in ED Medications  sodium chloride 0.9 % bolus 1,000 mL (not administered)  HYDROmorphone (DILAUDID) injection 1 mg (not administered)  ondansetron (ZOFRAN) injection 4 mg (not administered)     Initial Impression / Assessment and Plan / ED Course  I have reviewed the triage vital signs and the nursing notes.  Pertinent labs & imaging results that were available during my care of the patient were reviewed by me and considered in my medical decision making (see chart for details).     Patient with strep pharyngitis. Will start on Keflex patient also given Zofran and Vicodin and will follow-up as needed  Final Clinical Impressions(s) / ED Diagnoses   Final diagnoses:  None    New Prescriptions New  Prescriptions   No medications on file  The chart was scribed for me under my direct supervision.  I personally performed the history, physical, and medical decision making and all procedures in the evaluation of this patient.Milton Ferguson, MD 09/17/16 1324

## 2016-09-17 NOTE — ED Notes (Signed)
Pt reports that pain in his kidneys is radiating down his legs.

## 2016-12-27 DIAGNOSIS — Z23 Encounter for immunization: Secondary | ICD-10-CM | POA: Insufficient documentation

## 2016-12-27 DIAGNOSIS — W269XXA Contact with unspecified sharp object(s), initial encounter: Secondary | ICD-10-CM | POA: Insufficient documentation

## 2016-12-27 DIAGNOSIS — Y939 Activity, unspecified: Secondary | ICD-10-CM | POA: Insufficient documentation

## 2016-12-27 DIAGNOSIS — Z79899 Other long term (current) drug therapy: Secondary | ICD-10-CM | POA: Insufficient documentation

## 2016-12-27 DIAGNOSIS — Y998 Other external cause status: Secondary | ICD-10-CM | POA: Insufficient documentation

## 2016-12-27 DIAGNOSIS — Y929 Unspecified place or not applicable: Secondary | ICD-10-CM | POA: Insufficient documentation

## 2016-12-27 DIAGNOSIS — S61512A Laceration without foreign body of left wrist, initial encounter: Secondary | ICD-10-CM | POA: Insufficient documentation
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
## 2016-12-28 ENCOUNTER — Emergency Department
Admission: EM | Admit: 2016-12-28 | Discharge: 2016-12-28 | Disposition: A | Discharge status: Home / Self Care | Payer: Self-pay | Attending: Emergency Medicine | Admitting: Emergency Medicine

## 2016-12-28 DIAGNOSIS — S41112A Laceration without foreign body of left upper arm, initial encounter: Secondary | ICD-10-CM

## 2016-12-28 MED ORDER — LIDOCAINE HCL (PF) 1 % IJ SOLN: 5.0000 mL | Freq: Once | INTRAMUSCULAR | Status: DC

## 2016-12-28 MED ORDER — TETANUS-DIPHTH-ACELL PERTUSSIS 5-2.5-18.5 LF-MCG/0.5 IM SUSP
0.5000 mL | Freq: Once | INTRAMUSCULAR | Status: AC
  Administered 2016-12-28: 0.5 mL via INTRAMUSCULAR
  Filled 2016-12-28: qty 0.5

## 2016-12-28 MED ORDER — TETANUS-DIPHTH-ACELL PERTUSSIS 5-2.5-18.5 LF-MCG/0.5 IM SUSP
INTRAMUSCULAR | Status: AC
  Filled 2016-12-28: qty 0.5

## 2016-12-28 MED ORDER — OXYCODONE-ACETAMINOPHEN 5-325 MG PO TABS
1.0000 | ORAL_TABLET | Freq: Once | ORAL | Status: AC
  Administered 2016-12-28: 1 via ORAL
  Filled 2016-12-28: qty 1

## 2016-12-28 MED ORDER — ACETAMINOPHEN 500 MG PO TABS
1000.0000 mg | ORAL_TABLET | Freq: Once | ORAL | Status: DC
Start: 2016-12-28 — End: 2016-12-28

## 2016-12-28 MED ORDER — LIDOCAINE HCL (PF) 1 % IJ SOLN
INTRAMUSCULAR | Status: AC
  Filled 2016-12-28: qty 5

## 2016-12-28 MED ORDER — TRAMADOL HCL 50 MG PO TABS: 50.0000 mg | ORAL_TABLET | Freq: Four times a day (QID) | ORAL | 0 refills | Status: DC | PRN

## 2016-12-28 NOTE — ED Notes (Signed)

## 2016-12-28 NOTE — Discharge Instructions (Signed)
Please follow-up with your doctor in 7 days for suture removal. Return to the emergency department for any signs of infection such as increased pain, redness or pus drainage or fever.
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
## 2017-06-29 ENCOUNTER — Encounter: Payer: Self-pay | Admitting: Emergency Medicine

## 2017-06-29 ENCOUNTER — Emergency Department
Admission: EM | Admit: 2017-06-29 | Discharge: 2017-06-29 | Disposition: A | Discharge status: Home / Self Care | Payer: Self-pay | Attending: Student in an Organized Health Care Education/Training Program | Admitting: Student in an Organized Health Care Education/Training Program

## 2017-06-29 ENCOUNTER — Other Ambulatory Visit: Payer: Self-pay

## 2017-06-29 DIAGNOSIS — H1089 Other conjunctivitis: Secondary | ICD-10-CM | POA: Insufficient documentation

## 2017-06-29 DIAGNOSIS — F1290 Cannabis use, unspecified, uncomplicated: Secondary | ICD-10-CM | POA: Insufficient documentation

## 2017-06-29 DIAGNOSIS — H1033 Unspecified acute conjunctivitis, bilateral: Secondary | ICD-10-CM

## 2017-06-29 DIAGNOSIS — Z79899 Other long term (current) drug therapy: Secondary | ICD-10-CM | POA: Insufficient documentation

## 2017-06-29 MED ORDER — CIPROFLOXACIN HCL 0.3 % OP SOLN: 1.0000 [drp] | OPHTHALMIC | 0 refills | Status: AC

## 2017-06-29 MED ORDER — CIPROFLOXACIN HCL 0.3 % OP SOLN
2.0000 [drp] | OPHTHALMIC | Status: DC
  Administered 2017-06-29: 2 [drp] via OPHTHALMIC
  Filled 2017-06-29: qty 2.5

## 2017-06-29 MED ORDER — BUTALBITAL-APAP-CAFFEINE 50-325-40 MG PO TABS
1.0000 | ORAL_TABLET | Freq: Once | ORAL | Status: AC
  Administered 2017-06-29: 1 via ORAL
  Filled 2017-06-29: qty 1

## 2017-06-29 NOTE — ED Provider Notes (Signed)
[facility] Emergency Department Provider Note ____________________________________________  Time seen: Approximately 5:24 PM  I have reviewed the triage vital signs and the nursing notes.   HISTORY  Chief Complaint Conjunctivitis   HPI Victor Hale is a 41 y.o. male who presents to the emergency department for treatment and evaluation of bilateral eye irritation and drainage. He awakened 3 days ago with right eye matted shut then 2 days ago both eyes matted. He has used OTC drops without relief. He denies fever or recent illness.  Past Medical History:  Diagnosis Date  . Fatty liver     Patient Active Problem List   Diagnosis Date Noted  . Gonorrhea in male 07/05/2014  . Constipation 07/05/2014  . Acute anal fissure 07/05/2014  . UTI (urinary tract infection) 07/05/2014  . Sepsis ([place]) 07/02/2014  . Abdominal pain 07/02/2014    History reviewed. No pertinent surgical history.  Prior to Admission medications   Medication Sig Start Date End Date Taking? Authorizing Provider  cephALEXin (KEFLEX) 500 MG capsule Take 1 capsule (500 mg total) by mouth 4 (four) times daily. 09/17/16   Milton Ferguson, MD  chlorpheniramine-HYDROcodone (TUSSIONEX PENNKINETIC ER) 10-8 MG/5ML SUER Take 5 mLs by mouth 2 (two) times daily. 10/22/15   Earleen Newport, MD  ciprofloxacin (CILOXAN) 0.3 % ophthalmic solution Place 1 drop into both eyes every 2 (two) hours for 2 days. Administer 1 drop, every 2 hours, while awake, for 2 days. Then 1 drop, every 4 hours, while awake, for the next 5 days. 06/29/17 07/01/17  Tenise Stetler, Dessa Phi, FNP  cyclobenzaprine (FLEXERIL) 10 MG tablet Take 1 tablet (10 mg total) by mouth 3 (three) times daily as needed for muscle spasms. 09/11/16   Merlyn Lot, MD  cyclobenzaprine (FLEXERIL) 5 MG tablet Take 1 tablet (5 mg total) by mouth every 8 (eight) hours as needed for muscle spasms. 12/20/14   Beers, Pierce Crane, PA-C  dicyclomine (BENTYL) 20 MG  tablet Take 1 tablet (20 mg total) by mouth 2 (two) times daily. 10/12/14   Waynetta Pean, PA-C  docusate sodium (COLACE) 100 MG capsule Take 1 capsule (100 mg total) by mouth 2 (two) times daily. Patient not taking: Reported on 10/12/2014 07/05/14   Dellinger, Bobby Rumpf, PA-C  famotidine (PEPCID) 20 MG tablet Take 1 tablet (20 mg total) by mouth 2 (two) times daily. 10/22/15   Earleen Newport, MD  HYDROcodone-acetaminophen (NORCO/VICODIN) 5-325 MG tablet Take 1 tablet by mouth every 6 (six) hours as needed for moderate pain. 09/17/16   Milton Ferguson, MD  ibuprofen (ADVIL,MOTRIN) 800 MG tablet Take 1 tablet (800 mg total) by mouth every 8 (eight) hours as needed. 12/20/14   Beers, Pierce Crane, PA-C  ibuprofen (ADVIL,MOTRIN) 800 MG tablet Take 1 tablet (800 mg total) by mouth every 8 (eight) hours as needed for moderate pain. 06/11/16   Sable Feil, PA-C  meloxicam (MOBIC) 15 MG tablet Take 1 tablet (15 mg total) by mouth daily. 03/03/16   Cuthriell, Charline Bills, PA-C  Multiple Vitamins-Minerals (ONE DAILY MULTIVITAMIN MEN PO) Take 1 tablet by mouth daily.    [provider]  naproxen (NAPROSYN) 375 MG tablet Take 1 tablet (375 mg total) by mouth 2 (two) times daily with a meal. 09/11/16   Merlyn Lot, MD  omeprazole (PRILOSEC) 20 MG capsule Take 1 capsule (20 mg total) by mouth daily. 10/12/14   Waynetta Pean, PA-C  ondansetron (ZOFRAN ODT) 4 MG disintegrating tablet 4mg  ODT q4 hours prn nausea/vomit 09/17/16  Milton Ferguson, MD  ondansetron [facility]) 4 MG tablet Take 1 tablet (4 mg total) by mouth daily as needed for nausea or vomiting. 10/22/15   Earleen Newport, MD  oxyCODONE-acetaminophen (ROXICET) 5-325 MG tablet Take 1 tablet by mouth every 6 (six) hours as needed. 09/11/16 09/11/17  Merlyn Lot, MD  traMADol (ULTRAM) 50 MG tablet Take 1 tablet (50 mg total) by mouth every 6 (six) hours as needed. 12/28/16   Harvest Dark, MD    Allergies Penicillins  Family History   Problem Relation Age of Onset  . Hypertension Mother   . Diabetes Mother     Social History Social History   Tobacco Use  . Smoking status: Never Smoker  . Smokeless tobacco: Never Used  Substance Use Topics  . Alcohol use: No  . Drug use: Yes    Frequency: 6.0 times per week    Types: Marijuana    Review of Systems   Constitutional: No fever/chills Eyes: Positive for blurred vision. Positive for conjunctival erythema and drainage. Musculoskeletal: Negative for pain. Skin: Negative for rash. Neurological: Positive for headaches, negative for focal weakness or numbness. Allergic: Negative for seasonal allergies. ____________________________________________  PHYSICAL EXAM:  VITAL SIGNS: ED Triage Vitals  Enc Vitals Group     BP 06/29/17 1711 (!) 142/93     Pulse Rate 06/29/17 1711 88     Resp 06/29/17 1711 20     Temp 06/29/17 1711 98.8 F (37.1 C)     Temp Source 06/29/17 1711 Oral     SpO2 06/29/17 1711 97 %     Weight 06/29/17 1713 247 lb (112 kg)     Height 06/29/17 1713 6\' 2"  (1.88 m)     Head Circumference --      Peak Flow --      Pain Score 06/29/17 1711 10     Pain Loc --      Pain Edu? --      Excl. in [employer]? --     Constitutional: Alert and oriented. Well appearing and in no acute distress. Eyes: Visual acuity--see nursing documentation; no globe trauma; Eyelids normal to inspection; Sclera appears anicteric.  Eyelids not inverted. Conjunctiva appears injected and erythematous with purulent and clear drainage; Cornea clear on visual exam. Head: Atraumatic. Nose: No congestion/rhinnorhea. Mouth/Throat: Mucous membranes are moist.  Oropharynx non-erythematous. Respiratory: Occasional cough observed. Breath sounds clear to auscultation. Musculoskeletal:Normal ROM x 4 extremities. Neurologic:  Normal speech and language. No gross focal neurologic deficits are appreciated. Speech is normal. No gait instability. Skin:  Skin is warm, dry and intact. No rash  noted. Psychiatric: Mood and affect are normal. Speech and behavior are normal.  ____________________________________________   LABS (all labs ordered are listed, but only abnormal results are displayed)  Labs Reviewed - No data to display ____________________________________________  EKG  Not indicated. ____________________________________________  RADIOLOGY  Not indicated. ____________________________________________   PROCEDURES  Procedure(s) performed: None ____________________________________________   INITIAL IMPRESSION / ASSESSMENT AND PLAN / ED COURSE  41 year old male presents with symptoms and exam consistent with bacterial conjunctivitis. He was given Cipro drops while here and a prescription for the same. Because he is complaining of headache associated with pressure behind his eyes, he was medicated with Fioricet. He was advised to follow up with ophthalmology for symptoms that are not improving over the next 2 days. He was advised to return to the ER for symptoms that change or worse if unable to schedule an appointment.  Pertinent labs &  imaging results that were available during my care of the patient were reviewed by me and considered in my medical decision making (see chart for details). ____________________________________________   FINAL CLINICAL IMPRESSION(S) / ED DIAGNOSES  Final diagnoses:  Acute bacterial conjunctivitis of both eyes    Note:  This document was prepared using Dragon voice recognition software and may include unintentional dictation errors.    Victorino Dike, FNP 06/29/17 1818    Merlyn Lot, MD 06/29/17 [phone]

## 2017-06-29 NOTE — ED Triage Notes (Signed)
Eyes watering and discharge x 2 days. No injury.

## 2017-07-01 ENCOUNTER — Emergency Department (HOSPITAL_COMMUNITY): Payer: Self-pay

## 2017-07-01 ENCOUNTER — Emergency Department (HOSPITAL_COMMUNITY)
Admission: EM | Admit: 2017-07-01 | Discharge: 2017-07-01 | Disposition: A | Discharge status: Home / Self Care | Payer: Self-pay | Attending: Emergency Medicine | Admitting: Emergency Medicine

## 2017-07-01 ENCOUNTER — Encounter (HOSPITAL_COMMUNITY): Payer: Self-pay

## 2017-07-01 ENCOUNTER — Other Ambulatory Visit: Payer: Self-pay

## 2017-07-01 DIAGNOSIS — Z79899 Other long term (current) drug therapy: Secondary | ICD-10-CM | POA: Insufficient documentation

## 2017-07-01 DIAGNOSIS — H5713 Ocular pain, bilateral: Secondary | ICD-10-CM | POA: Insufficient documentation

## 2017-07-01 LAB — BASIC METABOLIC PANEL
Anion gap: 11 (ref 5–15)
BUN: 8 mg/dL (ref 6–20)
CALCIUM: 8.9 mg/dL (ref 8.9–10.3)
CO2: 20 mmol/L — AB (ref 22–32)
CREATININE: 0.84 mg/dL (ref 0.61–1.24)
Chloride: 104 mmol/L (ref 101–111)
Glucose, Bld: 76 mg/dL (ref 65–99)
Potassium: 4.3 mmol/L (ref 3.5–5.1)
Sodium: 135 mmol/L (ref 135–145)

## 2017-07-01 LAB — CBC
HCT: 42.1 % (ref 39.0–52.0)
Hemoglobin: 14.4 g/dL (ref 13.0–17.0)
MCH: 31.6 pg (ref 26.0–34.0)
MCHC: 34.2 g/dL (ref 30.0–36.0)
MCV: 92.3 fL (ref 78.0–100.0)
PLATELETS: 227 10*3/uL (ref 150–400)
RBC: 4.56 MIL/uL (ref 4.22–5.81)
RDW: 13 % (ref 11.5–15.5)
WBC: 6.5 10*3/uL (ref 4.0–10.5)

## 2017-07-01 LAB — I-STAT TROPONIN, ED: Troponin i, poc: 0 ng/mL (ref 0.00–0.08)

## 2017-07-01 LAB — D-DIMER, QUANTITATIVE: D-Dimer, Quant: 0.29 ug/mL-FEU (ref 0.00–0.50)

## 2017-07-01 LAB — BRAIN NATRIURETIC PEPTIDE: B NATRIURETIC PEPTIDE 5: 13.2 pg/mL (ref 0.0–100.0)

## 2017-07-01 MED ORDER — FLUORESCEIN SODIUM 1 MG OP STRP
1.0000 | ORAL_STRIP | Freq: Once | OPHTHALMIC | Status: AC
Start: 2017-07-01 — End: 2017-07-01
  Administered 2017-07-01: 1 via OPHTHALMIC
  Filled 2017-07-01: qty 1

## 2017-07-01 MED ORDER — OXYCODONE-ACETAMINOPHEN 5-325 MG PO TABS
1.0000 | ORAL_TABLET | Freq: Once | ORAL | Status: AC
  Administered 2017-07-01: 1 via ORAL
  Filled 2017-07-01: qty 1

## 2017-07-01 MED ORDER — PROPARACAINE HCL 0.5 % OP SOLN
1.0000 [drp] | Freq: Once | OPHTHALMIC | Status: AC
  Administered 2017-07-01: 1 [drp] via OPHTHALMIC
  Filled 2017-07-01: qty 15

## 2017-07-01 MED ORDER — CYCLOPENTOLATE HCL 1 % OP SOLN
1.0000 [drp] | Freq: Three times a day (TID) | OPHTHALMIC | Status: DC
  Administered 2017-07-01: 1 [drp] via OPHTHALMIC
  Filled 2017-07-01: qty 2

## 2017-07-01 MED ORDER — PREDNISOLONE ACETATE 1 % OP SUSP
1.0000 [drp] | Freq: Four times a day (QID) | OPHTHALMIC | Status: DC
  Administered 2017-07-01: 1 [drp] via OPHTHALMIC
  Filled 2017-07-01: qty 1

## 2017-07-01 NOTE — ED Notes (Signed)
PT states understanding of care given, follow up care, and medication prescribed. PT ambulated from ED to car with a steady gait. 

## 2017-07-01 NOTE — ED Notes (Signed)
Pt back in room from xray 

## 2017-07-01 NOTE — ED Notes (Signed)
Pt's wife called, told to pt.

## 2017-07-01 NOTE — ED Provider Notes (Signed)
Presenting with headache and eye pain. [facility]ERGENCY DEPARTMENT Provider Note   CSN: [id number] Arrival date & time: 07/01/17  1252     History   Chief Complaint Chief Complaint  Patient presents with  . Headache  . Numbness    HPI  Victor Hale is a 41 y.o. male presenting with headache and eye pain.  He states he woke up on Wednesday for work and around 11:30 AM had a right eye that was swollen and painful.  He washed his face and went to work.  After work around 5:00 PM his left eye had similar symptoms. He endorses watery discharge.  He states by Thursday he could not open his eyes and they felt itchy.  He reports he works outside but did not have any foreign object fly into his eye.  No history of trauma to the eye.  He states his vision is blurry and it hurts to look at things that are up close.   Distant vision is normal.  He endorses photophobia. Frontal headache is 10/10 and he concurrently has facial pain.  He reports subjective fevers on and off but has not checked temperature at home.  He has had cough and congestion over the last few days which ended on Sunday night.  He endorses nausea and dizziness.  He has been using cool compresses and ibuprofen and Tylenol at home for the pain which have not helped.  He denies any history of migraines or headaches.  No sick contacts.   He reports he went to [facility] on Saturday and was given eyedrops for conjunctivitis.  He has been using these as directed but has not seen much improvement.    Additionally, endorses some shortness of breath worse with exertion.  He reports his girlfriend sometimes mentions he stops breathing in his sleep.  He has had a recent long car ride to [facility] for work.  He denies leg swelling and calf pain.     Past Medical History:  Diagnosis Date  . Fatty liver    Patient Active Problem List   Diagnosis Date Noted  . Gonorrhea in male 07/05/2014  . Constipation 07/05/2014  .  Acute anal fissure 07/05/2014  . UTI (urinary tract infection) 07/05/2014  . Sepsis ([place]) 07/02/2014  . Abdominal pain 07/02/2014   History reviewed. No pertinent surgical history.  Home Medications    Prior to Admission medications   Medication Sig Start Date End Date Taking? Authorizing Provider  ciprofloxacin (CILOXAN) 0.3 % ophthalmic solution Place 1 drop into both eyes every 2 (two) hours for 2 days. Administer 1 drop, every 2 hours, while awake, for 2 days. Then 1 drop, every 4 hours, while awake, for the next 5 days. 06/29/17 07/01/17 Yes Triplett, Cari B, FNP  cyclobenzaprine (FLEXERIL) 10 MG tablet Take 1 tablet (10 mg total) by mouth 3 (three) times daily as needed for muscle spasms. Patient not taking: Reported on 07/01/2017 09/11/16   Merlyn Lot, MD  cyclobenzaprine (FLEXERIL) 5 MG tablet Take 1 tablet (5 mg total) by mouth every 8 (eight) hours as needed for muscle spasms. Patient not taking: Reported on 07/01/2017 12/20/14   Beers, Pierce Crane, PA-C  dicyclomine (BENTYL) 20 MG tablet Take 1 tablet (20 mg total) by mouth 2 (two) times daily. Patient not taking: Reported on 07/01/2017 10/12/14   Waynetta Pean, PA-C  docusate sodium (COLACE) 100 MG capsule Take 1 capsule (100 mg total) by mouth 2 (two) times daily. Patient not  taking: Reported on 10/12/2014 07/05/14   Dellinger, Bobby Rumpf, PA-C  famotidine (PEPCID) 20 MG tablet Take 1 tablet (20 mg total) by mouth 2 (two) times daily. Patient not taking: Reported on 07/01/2017 10/22/15   Earleen Newport, MD  HYDROcodone-acetaminophen (NORCO/VICODIN) 5-325 MG tablet Take 1 tablet by mouth every 6 (six) hours as needed for moderate pain. Patient not taking: Reported on 07/01/2017 09/17/16   Milton Ferguson, MD  ibuprofen (ADVIL,MOTRIN) 800 MG tablet Take 1 tablet (800 mg total) by mouth every 8 (eight) hours as needed. Patient not taking: Reported on 07/01/2017 12/20/14   Beers, Pierce Crane, PA-C  ibuprofen (ADVIL,MOTRIN) 800 MG  tablet Take 1 tablet (800 mg total) by mouth every 8 (eight) hours as needed for moderate pain. Patient not taking: Reported on 07/01/2017 06/11/16   Sable Feil, PA-C  meloxicam (MOBIC) 15 MG tablet Take 1 tablet (15 mg total) by mouth daily. Patient not taking: Reported on 07/01/2017 03/03/16   Cuthriell, Charline Bills, PA-C  naproxen (NAPROSYN) 375 MG tablet Take 1 tablet (375 mg total) by mouth 2 (two) times daily with a meal. Patient not taking: Reported on 07/01/2017 09/11/16   Merlyn Lot, MD  omeprazole (PRILOSEC) 20 MG capsule Take 1 capsule (20 mg total) by mouth daily. Patient not taking: Reported on 07/01/2017 10/12/14   Waynetta Pean, PA-C  ondansetron (ZOFRAN ODT) 4 MG disintegrating tablet 4mg  ODT q4 hours prn nausea/vomit Patient not taking: Reported on 07/01/2017 09/17/16   Milton Ferguson, MD  ondansetron (ZOFRAN) 4 MG tablet Take 1 tablet (4 mg total) by mouth daily as needed for nausea or vomiting. Patient not taking: Reported on 07/01/2017 10/22/15   Earleen Newport, MD  oxyCODONE-acetaminophen (ROXICET) 5-325 MG tablet Take 1 tablet by mouth every 6 (six) hours as needed. Patient not taking: Reported on 07/01/2017 09/11/16 09/11/17  Merlyn Lot, MD  traMADol (ULTRAM) 50 MG tablet Take 1 tablet (50 mg total) by mouth every 6 (six) hours as needed. Patient not taking: Reported on 07/01/2017 12/28/16   Harvest Dark, MD    Family History Family History  Problem Relation Age of Onset  . Hypertension Mother   . Diabetes Mother     Social History Social History   Tobacco Use  . Smoking status: Never Smoker  . Smokeless tobacco: Never Used  Substance Use Topics  . Alcohol use: No  . Drug use: Yes    Frequency: 6.0 times per week    Types: Marijuana   Allergies   Penicillins  Review of Systems Review of Systems  Constitutional: Negative for chills and fever.  HENT: Positive for congestion, sinus pressure and sinus pain. Negative for rhinorrhea.     Eyes: Positive for photophobia, pain, redness, itching and visual disturbance.  Respiratory: Negative for chest tightness and wheezing.   Cardiovascular: Negative for chest pain, palpitations and leg swelling.  Gastrointestinal: Negative for abdominal pain.  Skin: Negative for wound.  Neurological: Positive for headaches.   Physical Exam Updated Vital Signs BP (!) 120/91   Pulse 83   Temp 98.8 F (37.1 C) (Oral)   Resp 16   SpO2 93%   Physical Exam Gen- 41 yo male, moderate distress  Skin - warm, dry  HEENT - pain with EOMI and pupillary constriction, conjunctival injection bilaterally, mild swelling of R eyelid, TTP over bilateral eyelids, no corneal abrasion or foreign bodies identified on fluoroscien stain, mild watery discharge  Neck - supple, no JVD Chest - CTAB, no crackles or wheeze  present Heart - RRR no MRG  Abdomen - soft, NTND, +bs  Musculoskeletal - no edema or calf pain Neuro -alert, oriented x3, strength 5/5 bilaterally in upper and lower extr, normal sensation   ED Treatments / Results  Labs (all labs ordered are listed, but only abnormal results are displayed) Labs Reviewed  BASIC METABOLIC PANEL - Abnormal; Notable for the following components:      Result Value   CO2 20 (*)    All other components within normal limits  CBC  BRAIN NATRIURETIC PEPTIDE  D-DIMER, QUANTITATIVE (NOT AT [facility])  I-STAT TROPONIN, ED    EKG  EKG Interpretation  Date/Time:  Monday July 01 2017 13:39:17 EST Ventricular Rate:  83 PR Interval:    QRS Duration: 86 QT Interval:  370 QTC Calculation: 435 R Axis:   77 Text Interpretation:  Sinus rhythm new T wave inversion III compared to previous Confirmed by Theotis Burrow [phone]) on 07/01/2017 5:53:48 PM      Radiology Dg Chest 2 View  Result Date: 07/01/2017 CLINICAL DATA:  Shortness of breath EXAM: CHEST  2 VIEW COMPARISON:  10/22/2015 FINDINGS: Low lung volumes. Minimal atelectasis at the left base. No focal  consolidation or effusion. Borderline to mild cardiomegaly. No pneumothorax. IMPRESSION: 1. Low lung volumes with mild left basilar atelectasis 2. Borderline to mild cardiomegaly Electronically Signed   By: Donavan Foil M.D.   On: 07/01/2017 20:28   Procedures Procedures (including critical care time)  Medications Ordered in ED Medications  prednisoLONE acetate (PRED FORTE) 1 % ophthalmic suspension 1 drop (not administered)  cyclopentolate (CYCLODRYL,CYCLOGYL) 1 % ophthalmic solution 1 drop (1 drop Both Eyes Given 07/01/17 2120)  oxyCODONE-acetaminophen (PERCOCET/ROXICET) 5-325 MG per tablet 1 tablet (1 tablet Oral Given 07/01/17 1702)  proparacaine (ALCAINE) 0.5 % ophthalmic solution 1 drop (1 drop Both Eyes Given 07/01/17 1850)  fluorescein ophthalmic strip 1 strip (1 strip Both Eyes Given 07/01/17 1850)   Initial Impression / Assessment and Plan / ED Course  I have reviewed the triage vital signs and the nursing notes.    Pertinent labs & imaging results that were available during my care of the patient were reviewed by me and considered in my medical decision making (see chart for details).  41 y/o male presenting with eye pain and headache.   Vitals stable in ED and physical exam notable for photophobia and pain with eye movement.  Oxycodone given however pain did not appear well controlled and physical exam difficult to perform due to patient not being cooperative. Fluoroscien test with woods lamp performed and did not reveal any foreign bodies or corneal abrasions.  Ophthalmology consulted in ED and recommended continuing antibiotic drops with addition of Pred Forte and Cyclopentoate.  Labs unremarkable.   ED EKG with new t wave inversion in lead III compared to previous EKG however istat troponin and CXR negative.  CHF considered due to complaint of shortness of breath however he does not appear volume overloaded and lung exam is clear.  BNP 13.2 and wnl.  PE also considered given recent  long car ride however normal d-dimer.  He was discharged home in stable condition with ophthalmology follow up as outpatient tomorrow.   -Rx for Pred Forte and Cyclopentoate given  -Recommend continue Ciprofloxacin ophthalmic drops  -Pt to see Dr. Kristeen Miss tomorrow AM  -Will likely need outpatient sleep study to evaluate for possible OSA  -Return precautions discussed    Final Clinical Impressions(s) / ED Diagnoses   Final diagnoses:  Eye pain, bilateral   ED Discharge Orders    None     Lovenia Kim, MD [facility], PGY-2     Lovenia Kim, MD 07/01/17 2156    Rex Kras Wenda Overland, MD 07/01/17 [phone]

## 2017-07-01 NOTE — Discharge Instructions (Addendum)
You were seen in the ED for eye pain and headache which is most likely related to an inflammation.  You can continue to use the Ciprofloxacin antibiotic eyedrops four times a day.  In addition, we have prescribed you Pred Forte - 1 drop in each eye four times daily and the Cyclopentoate three times a day.  We discussed your condition with the ophthalmologist and they have scheduled for you to be seen tomorrow morning at 9 AM.  Please make sure you keep this appointment.

## 2017-07-01 NOTE — ED Triage Notes (Signed)
Pt heard yelling in his room . On arrive to asses Pt  this writer was told by Pt  That the pain meds given in triage did not help. Pt kept his head bent down and did not make contact. Pt was asked where the pain was located ,Pt only touched his head.  Pt not answering with words where his pain was located.

## 2017-07-01 NOTE — ED Triage Notes (Signed)
Per Pt, Pt is coming from home with complaints of bilateral blurred vision with eye redness along with photosensitivity and eye swelling that started a couple days ago. Reports constant headache and some intermittent numbness to the left arm. Pt has hx of HTN. BP assessed and WNL. Pt alert and oriented x4. NO neuro deficits noted upon assessment.

## 2017-12-09 IMAGING — CR DG CHEST 2V
2 series · 2 of 2 positions shown · non-contrast
Comparison: 10/22/2015

CLINICAL DATA: Shortness of breath

EXAM:
CHEST  2 VIEW

[chest lat]
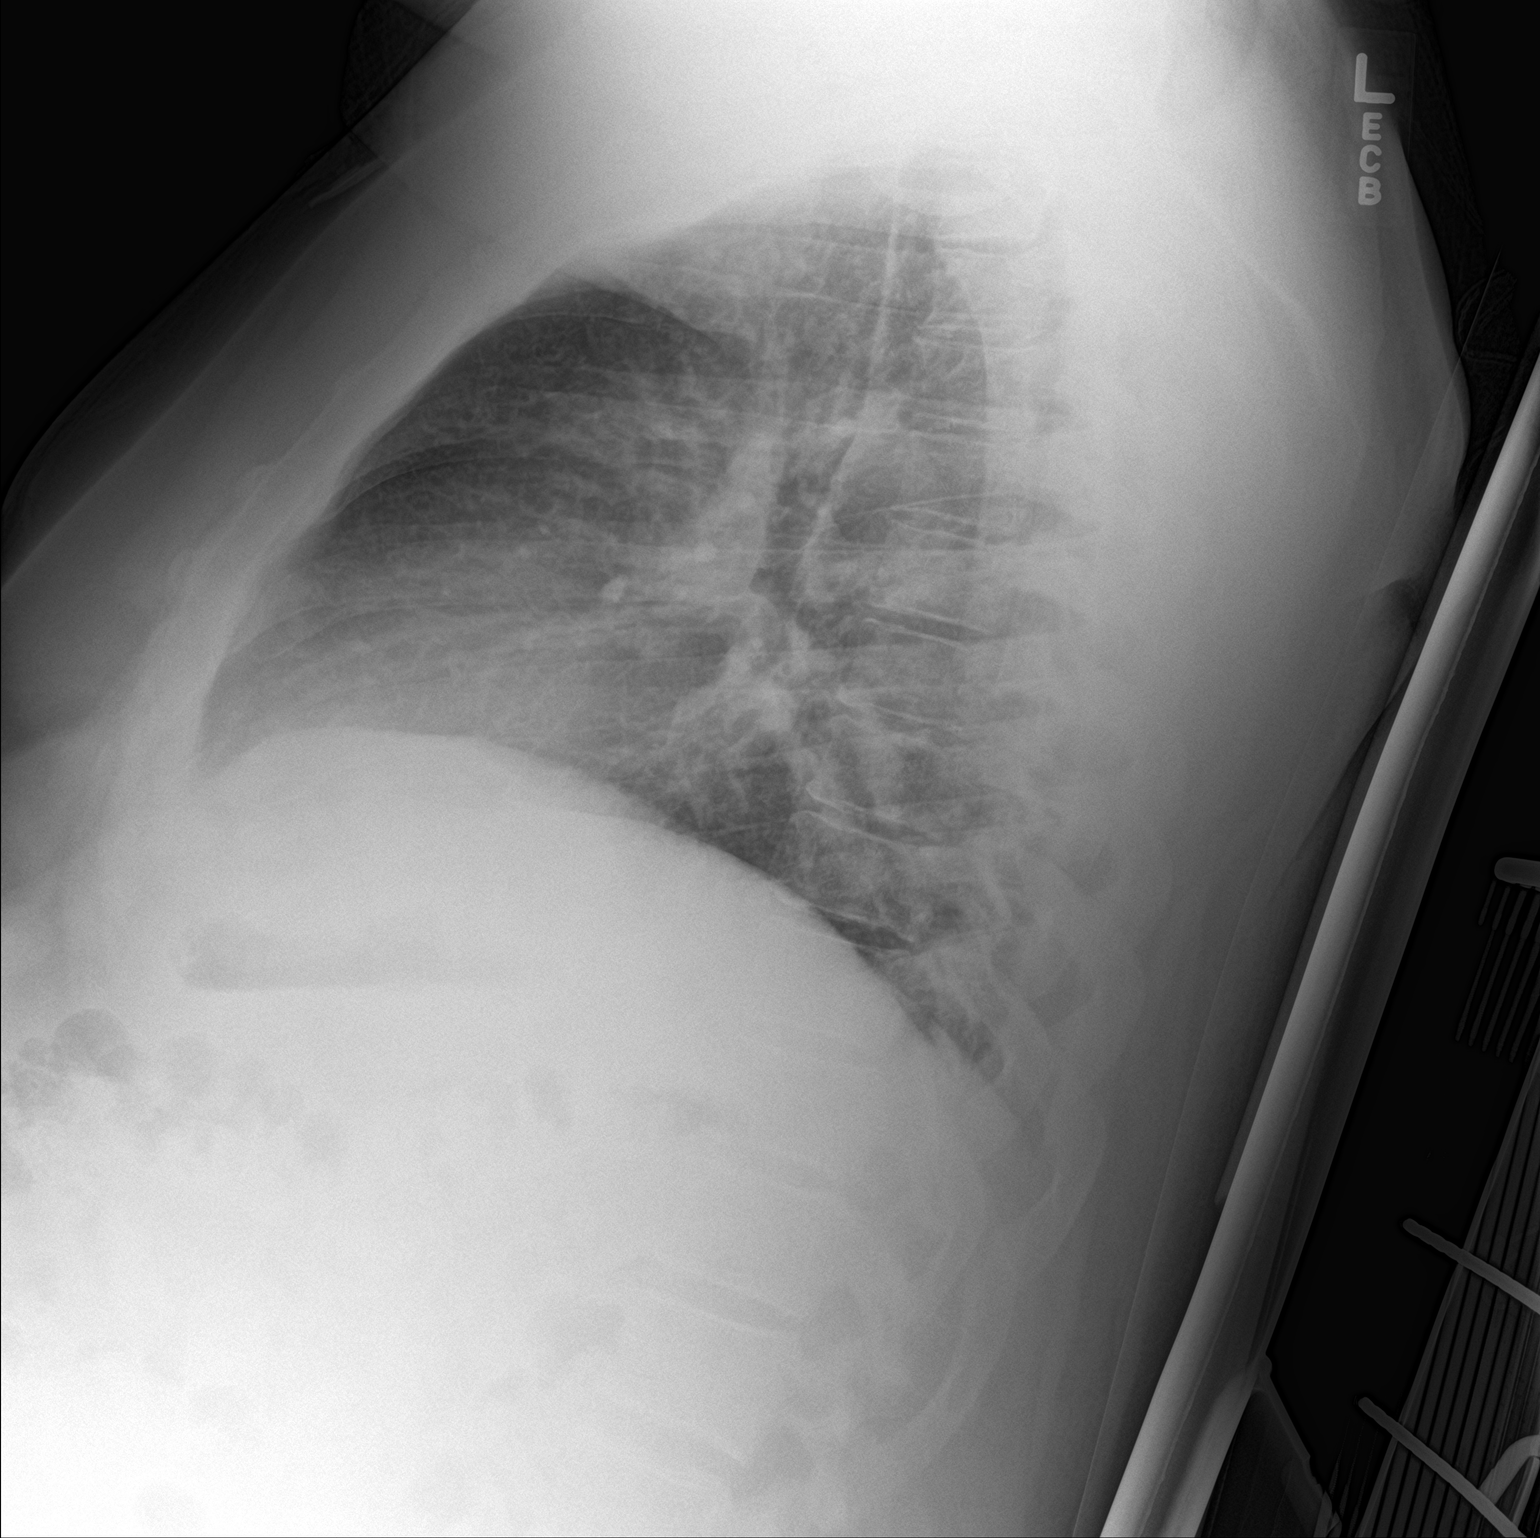

[chest ap]
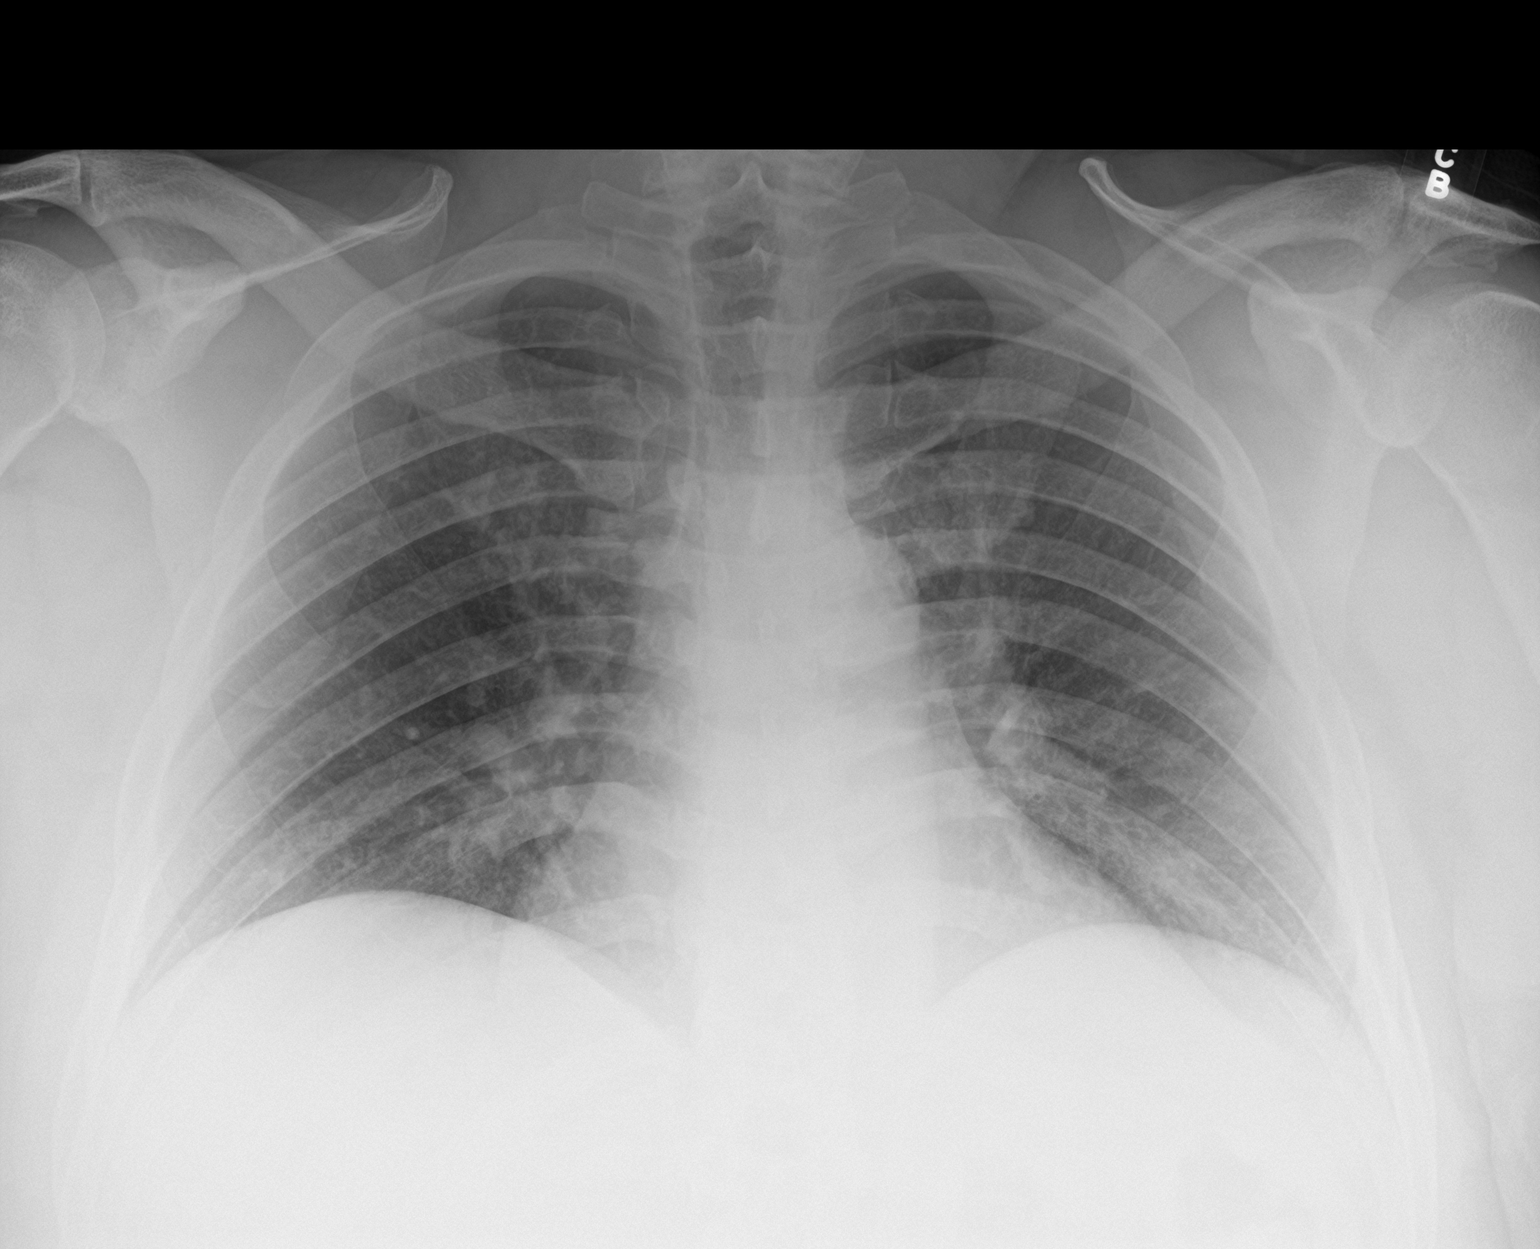

[2 of 2 positions shown; findings below may reference images not displayed]

FINDINGS: Low lung volumes. Minimal atelectasis at the left base. No focal
consolidation or effusion. Borderline to mild cardiomegaly. No
pneumothorax.
IMPRESSION: 1. Low lung volumes with mild left basilar atelectasis
2. Borderline to mild cardiomegaly

## 2018-10-02 ENCOUNTER — Other Ambulatory Visit: Payer: Self-pay | Admitting: Internal Medicine

## 2018-10-02 DIAGNOSIS — N200 Calculus of kidney: Secondary | ICD-10-CM

## 2018-10-02 DIAGNOSIS — R32 Unspecified urinary incontinence: Secondary | ICD-10-CM

## 2018-10-02 DIAGNOSIS — R109 Unspecified abdominal pain: Secondary | ICD-10-CM

## 2018-10-07 ENCOUNTER — Ambulatory Visit
Admission: RE | Admit: 2018-10-07 | Discharge: 2018-10-07 | Disposition: A | Discharge status: Home / Self Care | Payer: Self-pay | Source: Ambulatory Visit | Attending: Internal Medicine | Admitting: Internal Medicine

## 2018-10-07 DIAGNOSIS — R109 Unspecified abdominal pain: Secondary | ICD-10-CM | POA: Insufficient documentation

## 2018-10-07 DIAGNOSIS — N200 Calculus of kidney: Secondary | ICD-10-CM | POA: Insufficient documentation

## 2018-10-07 DIAGNOSIS — R32 Unspecified urinary incontinence: Secondary | ICD-10-CM | POA: Insufficient documentation

## 2018-10-22 ENCOUNTER — Ambulatory Visit: Payer: Self-pay | Admitting: Urology

## 2018-11-26 ENCOUNTER — Ambulatory Visit: Payer: Self-pay | Admitting: Urology

## 2019-01-31 ENCOUNTER — Emergency Department (HOSPITAL_COMMUNITY): Payer: Self-pay

## 2019-01-31 ENCOUNTER — Other Ambulatory Visit: Payer: Self-pay

## 2019-01-31 ENCOUNTER — Emergency Department (HOSPITAL_COMMUNITY)
Admission: EM | Admit: 2019-01-31 | Discharge: 2019-01-31 | Disposition: A | Discharge status: Home / Self Care | Payer: Self-pay | Attending: Emergency Medicine | Admitting: Emergency Medicine

## 2019-01-31 ENCOUNTER — Encounter (HOSPITAL_COMMUNITY): Payer: Self-pay | Admitting: Emergency Medicine

## 2019-01-31 DIAGNOSIS — Z79899 Other long term (current) drug therapy: Secondary | ICD-10-CM | POA: Insufficient documentation

## 2019-01-31 DIAGNOSIS — R109 Unspecified abdominal pain: Secondary | ICD-10-CM

## 2019-01-31 DIAGNOSIS — N201 Calculus of ureter: Secondary | ICD-10-CM

## 2019-01-31 DIAGNOSIS — N179 Acute kidney failure, unspecified: Secondary | ICD-10-CM | POA: Insufficient documentation

## 2019-01-31 DIAGNOSIS — N202 Calculus of kidney with calculus of ureter: Secondary | ICD-10-CM | POA: Insufficient documentation

## 2019-01-31 LAB — LIPASE, BLOOD: Lipase: 26 U/L (ref 11–51)

## 2019-01-31 LAB — URINALYSIS, ROUTINE W REFLEX MICROSCOPIC
Bacteria, UA: NONE SEEN
Bilirubin Urine: NEGATIVE
Glucose, UA: NEGATIVE mg/dL
Ketones, ur: NEGATIVE mg/dL
Leukocytes,Ua: NEGATIVE
Nitrite: NEGATIVE
Protein, ur: NEGATIVE mg/dL
Specific Gravity, Urine: 1.016 (ref 1.005–1.030)
pH: 6 (ref 5.0–8.0)

## 2019-01-31 LAB — COMPREHENSIVE METABOLIC PANEL
ALT: 73 U/L — ABNORMAL HIGH (ref 0–44)
AST: 57 U/L — ABNORMAL HIGH (ref 15–41)
Albumin: 3.9 g/dL (ref 3.5–5.0)
Alkaline Phosphatase: 50 U/L (ref 38–126)
Anion gap: 13 (ref 5–15)
BUN: 15 mg/dL (ref 6–20)
CO2: 23 mmol/L (ref 22–32)
Calcium: 9.3 mg/dL (ref 8.9–10.3)
Chloride: 102 mmol/L (ref 98–111)
Creatinine, Ser: 1.33 mg/dL — ABNORMAL HIGH (ref 0.61–1.24)
GFR calc Af Amer: >60 mL/min (ref >60)
GFR calc non Af Amer: >60 mL/min (ref >60)
Glucose, Bld: 179 mg/dL — ABNORMAL HIGH (ref 70–99)
Potassium: 3.6 mmol/L (ref 3.5–5.1)
Sodium: 138 mmol/L (ref 135–145)
Total Bilirubin: 0.5 mg/dL (ref 0.3–1.2)
Total Protein: 7.1 g/dL (ref 6.5–8.1)

## 2019-01-31 LAB — CBC
HCT: 42.4 % (ref 39.0–52.0)
Hemoglobin: 14.3 g/dL (ref 13.0–17.0)
MCH: 31.4 pg (ref 26.0–34.0)
MCHC: 33.7 g/dL (ref 30.0–36.0)
MCV: 93 fL (ref 80.0–100.0)
Platelets: 287 10*3/uL (ref 150–400)
RBC: 4.56 MIL/uL (ref 4.22–5.81)
RDW: 12.4 % (ref 11.5–15.5)
WBC: 12.8 10*3/uL — ABNORMAL HIGH (ref 4.0–10.5)
nRBC: 0 % (ref 0.0–0.2)

## 2019-01-31 MED ORDER — OXYCODONE-ACETAMINOPHEN 5-325 MG PO TABS
1.0000 | ORAL_TABLET | Freq: Once | ORAL | Status: AC
  Administered 2019-01-31: 1 via ORAL
  Filled 2019-01-31: qty 1

## 2019-01-31 MED ORDER — ONDANSETRON 4 MG PO TBDP: 4.0000 mg | ORAL_TABLET | Freq: Three times a day (TID) | ORAL | 0 refills | Status: DC | PRN

## 2019-01-31 MED ORDER — HYDROMORPHONE HCL 1 MG/ML IJ SOLN
1.0000 mg | Freq: Once | INTRAMUSCULAR | Status: AC
  Administered 2019-01-31: 09:00:00 1 mg via INTRAVENOUS
  Filled 2019-01-31: qty 1

## 2019-01-31 MED ORDER — SODIUM CHLORIDE 0.9% FLUSH: 3.0000 mL | Freq: Once | INTRAVENOUS | Status: DC

## 2019-01-31 MED ORDER — OXYCODONE-ACETAMINOPHEN 5-325 MG PO TABS: 1.0000 | ORAL_TABLET | ORAL | 0 refills | Status: DC | PRN

## 2019-01-31 MED ORDER — KETOROLAC TROMETHAMINE 15 MG/ML IJ SOLN
15.0000 mg | Freq: Once | INTRAMUSCULAR | Status: AC
  Administered 2019-01-31: 15 mg via INTRAVENOUS
  Filled 2019-01-31: qty 1

## 2019-01-31 MED ORDER — SODIUM CHLORIDE 0.9 % IV SOLN
1.5000 mg/kg | Freq: Once | INTRAVENOUS | Status: AC
  Administered 2019-01-31: 178 mg via INTRAVENOUS
  Filled 2019-01-31: qty 8.9

## 2019-01-31 MED ORDER — HYDROMORPHONE HCL 1 MG/ML IJ SOLN
1.0000 mg | Freq: Once | INTRAMUSCULAR | Status: AC
  Administered 2019-01-31: 1 mg via INTRAVENOUS
  Filled 2019-01-31: qty 1

## 2019-01-31 MED ORDER — ONDANSETRON HCL 4 MG/2ML IJ SOLN
4.0000 mg | Freq: Once | INTRAMUSCULAR | Status: AC
  Administered 2019-01-31: 4 mg via INTRAVENOUS
  Filled 2019-01-31: qty 2

## 2019-01-31 MED ORDER — TAMSULOSIN HCL 0.4 MG PO CAPS: 0.4000 mg | ORAL_CAPSULE | Freq: Every day | ORAL | 0 refills | Status: DC

## 2019-01-31 NOTE — Discharge Instructions (Signed)
Drink plenty of fluids Take narcotic pain medicine (Percocet) as needed for severe pain. Do not drink alcohol or drive while taking this medicine Ibuprofen 400mg  - Take for pain and inflammation. Take with food three times daily Take Zofran for nausea Take Flomax to help stone pass Return if symptoms are worsening

## 2019-01-31 NOTE — ED Notes (Signed)
Patient transported to CT 

## 2019-01-31 NOTE — ED Provider Notes (Signed)
[facility] EMERGENCY DEPARTMENT Provider Note   CSN: [id number] Arrival date & time: 01/31/19  0515     History   Chief Complaint Chief Complaint  Patient presents with  . Abdominal Pain    HPI Victor Hale is a 43 y.o. male who presents with right flank pain.  Past medical history significant for pyelonephritis, history of kidney stone, history of STI.  Patient reports acute onset of right flank pain starting several hours prior to arrival.  It is sharp and stabbing and feels like it is going to "burst".  It radiates to the right side of his abdomen.  No radiation to the groin.  The pain is constant and severe.  Nothing makes it better.  Lying on the side and palpation of the area makes it worse.  He reports associated seeing nausea and several episodes of vomiting.  No fever, chest pain, shortness of breath, diarrhea, urinary symptoms.  No significant surgical history.     HPI  Past Medical History:  Diagnosis Date  . Fatty liver     Patient Active Problem List   Diagnosis Date Noted  . Gonorrhea in male 07/05/2014  . Constipation 07/05/2014  . Acute anal fissure 07/05/2014  . UTI (urinary tract infection) 07/05/2014  . Sepsis ([place]) 07/02/2014  . Abdominal pain 07/02/2014    History reviewed. No pertinent surgical history.      Home Medications    Prior to Admission medications   Medication Sig Start Date End Date Taking? Authorizing Provider  cyclobenzaprine (FLEXERIL) 10 MG tablet Take 1 tablet (10 mg total) by mouth 3 (three) times daily as needed for muscle spasms. Patient not taking: Reported on 07/01/2017 09/11/16   Merlyn Lot, MD  cyclobenzaprine (FLEXERIL) 5 MG tablet Take 1 tablet (5 mg total) by mouth every 8 (eight) hours as needed for muscle spasms. Patient not taking: Reported on 07/01/2017 12/20/14   Beers, Pierce Crane, PA-C  dicyclomine (BENTYL) 20 MG tablet Take 1 tablet (20 mg total) by mouth 2 (two) times daily. Patient not  taking: Reported on 07/01/2017 10/12/14   Waynetta Pean, PA-C  docusate sodium (COLACE) 100 MG capsule Take 1 capsule (100 mg total) by mouth 2 (two) times daily. Patient not taking: Reported on 10/12/2014 07/05/14   Dellinger, Bobby Rumpf, PA-C  famotidine (PEPCID) 20 MG tablet Take 1 tablet (20 mg total) by mouth 2 (two) times daily. Patient not taking: Reported on 07/01/2017 10/22/15   Earleen Newport, MD  HYDROcodone-acetaminophen (NORCO/VICODIN) 5-325 MG tablet Take 1 tablet by mouth every 6 (six) hours as needed for moderate pain. Patient not taking: Reported on 07/01/2017 09/17/16   Milton Ferguson, MD  ibuprofen (ADVIL,MOTRIN) 800 MG tablet Take 1 tablet (800 mg total) by mouth every 8 (eight) hours as needed. Patient not taking: Reported on 07/01/2017 12/20/14   Beers, Pierce Crane, PA-C  ibuprofen (ADVIL,MOTRIN) 800 MG tablet Take 1 tablet (800 mg total) by mouth every 8 (eight) hours as needed for moderate pain. Patient not taking: Reported on 07/01/2017 06/11/16   Sable Feil, PA-C  meloxicam (MOBIC) 15 MG tablet Take 1 tablet (15 mg total) by mouth daily. Patient not taking: Reported on 07/01/2017 03/03/16   Cuthriell, Charline Bills, PA-C  naproxen (NAPROSYN) 375 MG tablet Take 1 tablet (375 mg total) by mouth 2 (two) times daily with a meal. Patient not taking: Reported on 07/01/2017 09/11/16   Merlyn Lot, MD  omeprazole (PRILOSEC) 20 MG capsule Take 1 capsule (20 mg  total) by mouth daily. Patient not taking: Reported on 07/01/2017 10/12/14   Waynetta Pean, PA-C  ondansetron (ZOFRAN ODT) 4 MG disintegrating tablet 4mg  ODT q4 hours prn nausea/vomit Patient not taking: Reported on 07/01/2017 09/17/16   Milton Ferguson, MD  ondansetron (ZOFRAN) 4 MG tablet Take 1 tablet (4 mg total) by mouth daily as needed for nausea or vomiting. Patient not taking: Reported on 07/01/2017 10/22/15   Earleen Newport, MD  traMADol (ULTRAM) 50 MG tablet Take 1 tablet (50 mg total) by mouth every 6  (six) hours as needed. Patient not taking: Reported on 07/01/2017 12/28/16   Harvest Dark, MD    Family History Family History  Problem Relation Age of Onset  . Hypertension Mother   . Diabetes Mother     Social History Social History   Tobacco Use  . Smoking status: Never Smoker  . Smokeless tobacco: Never Used  Substance Use Topics  . Alcohol use: No  . Drug use: Yes    Frequency: 6.0 times per week    Types: Marijuana     Allergies   Penicillins   Review of Systems Review of Systems  Constitutional: Negative for fever.  Respiratory: Negative for shortness of breath.   Cardiovascular: Negative for chest pain.  Gastrointestinal: Positive for abdominal pain, nausea and vomiting. Negative for diarrhea.  Genitourinary: Positive for flank pain. Negative for dysuria, frequency, hematuria and testicular pain.  All other systems reviewed and are negative.    Physical Exam Updated Vital Signs BP (!) 138/97 (BP Location: Right Arm)   Pulse 88   Temp 99 F (37.2 C) (Oral)   Resp (!) 28   Ht 6\' 2"  (1.88 m)   Wt 118.4 kg   SpO2 98%   BMI 33.51 kg/m   Physical Exam Vitals signs and nursing note reviewed.  Constitutional:      General: He is not in acute distress.    Appearance: He is well-developed. He is obese. He is not ill-appearing.     Comments: Cooperative. In pain  HENT:     Head: Normocephalic and atraumatic.  Eyes:     General: No scleral icterus.       Right eye: No discharge.        Left eye: No discharge.     Conjunctiva/sclera: Conjunctivae normal.     Pupils: Pupils are equal, round, and reactive to light.  Neck:     Musculoskeletal: Normal range of motion.  Cardiovascular:     Rate and Rhythm: Normal rate and regular rhythm.  Pulmonary:     Effort: Pulmonary effort is normal. No respiratory distress.     Breath sounds: Normal breath sounds.  Abdominal:     General: There is no distension.     Tenderness: There is no abdominal  tenderness. There is right CVA tenderness.  Skin:    General: Skin is warm and dry.  Neurological:     Mental Status: He is alert and oriented to person, place, and time.  Psychiatric:        Behavior: Behavior normal.      ED Treatments / Results  Labs (all labs ordered are listed, but only abnormal results are displayed) Labs Reviewed  COMPREHENSIVE METABOLIC PANEL - Abnormal; Notable for the following components:      Result Value   Glucose, Bld 179 (*)    Creatinine, Ser 1.33 (*)    AST 57 (*)    ALT 73 (*)    All other components  within normal limits  CBC - Abnormal; Notable for the following components:   WBC 12.8 (*)    All other components within normal limits  URINALYSIS, ROUTINE W REFLEX MICROSCOPIC - Abnormal; Notable for the following components:   Hgb urine dipstick MODERATE (*)    All other components within normal limits  LIPASE, BLOOD    EKG    Radiology Ct Renal Stone Study  Result Date: 01/31/2019 CLINICAL DATA:  Sudden onset of RIGHT-sided flank pain this morning. Nausea. Hematuria. EXAM: CT ABDOMEN AND PELVIS WITHOUT CONTRAST TECHNIQUE: Multidetector CT imaging of the abdomen and pelvis was performed following the standard protocol without IV contrast. COMPARISON:  CT abdomen dated 09/11/2016. FINDINGS: Lower chest: No acute abnormality. Hepatobiliary: Liver is low in density suggesting fatty infiltration. Gallbladder appears normal. No bile duct dilatation seen. Pancreas: Unremarkable. No pancreatic ductal dilatation or surrounding inflammatory changes. Spleen: Normal in size without focal abnormality. Adrenals/Urinary Tract: Adrenal glands appear normal. 1 mm stone within the distal RIGHT ureter, just proximal to the RIGHT UVJ, causing mild hydronephrosis and perinephric inflammation. 9 mm nonobstructing LEFT renal stone. Bladder appears normal, partially decompressed. Stomach/Bowel: No dilated large or small bowel loops. No evidence of bowel wall  inflammation. Appendix is normal. Stomach is unremarkable, partially decompressed. Vascular/Lymphatic: No significant vascular findings are present. No enlarged abdominal or pelvic lymph nodes. Reproductive: Prostate is unremarkable. Other: No free fluid or abscess collection. No free intraperitoneal air. Musculoskeletal: No acute or suspicious osseous finding. IMPRESSION: 1. 1 mm stone within the distal right ureter, just proximal to the right UVJ, causing mild hydronephrosis and perinephric inflammation. 2. 9 mm nonobstructing left renal stone. 3. Fatty infiltration of the liver. Electronically Signed   By: Franki Cabot M.D.   On: 01/31/2019 08:57    Procedures Procedures (including critical care time)  Medications Ordered in ED Medications  sodium chloride flush (NS) 0.9 % injection 3 mL (has no administration in time range)  HYDROmorphone (DILAUDID) injection 1 mg (has no administration in time range)  ondansetron (ZOFRAN) injection 4 mg (has no administration in time range)  ketorolac (TORADOL) 15 MG/ML injection 15 mg (has no administration in time range)     Initial Impression / Assessment and Plan / ED Course  I have reviewed the triage vital signs and the nursing notes.  Pertinent labs & imaging results that were available during my care of the patient were reviewed by me and considered in my medical decision making (see chart for details).  43 year old male presents with acute right flank pain that woke him up from sleep this morning.  He is mildly hypertensive but otherwise vital signs are normal.  He is in mild distress due to pain and has right CVA tenderness.  CBC is remarkable for mild leukocytosis of 12.8.  CMP is remarkable for mild hyperglycemia and mild AKI of 1.3.  Slight transaminitis probably from fatty liver.   UA has moderate hemoglobin - no signs of UTI.  Will obtain CT renal and provide pain control.  CT shows 53mm distal stone causing mild hydro on the right. Pain is  still uncontrolled. Will give another dose of Dilaudid.  RN reports pain is still uncontrolled. Will try IV Lidocaine.  11:41 AM Pt reports pain is 6/10. He feels okay to go home. Will give Percocet before he goes and then will rx pain medicine, Zofran, Flomax. He was encouraged to f/u with Urology. Advised return if worsening   Final Clinical Impressions(s) / ED Diagnoses  Final diagnoses:  Calculus of ureter  Right flank pain  AKI (acute kidney injury) [facility])    ED Discharge Orders    None       Recardo Evangelist, PA-C 01/31/19 1144    Maudie Flakes, MD 02/04/19 [phone]

## 2019-01-31 NOTE — ED Notes (Addendum)
Heat packs applied to right flank.

## 2019-01-31 NOTE — ED Notes (Addendum)
Victor Hale [phone]

## 2019-01-31 NOTE — ED Notes (Signed)
Pain continues to remain 7/10.

## 2019-01-31 NOTE — ED Triage Notes (Signed)
Pt was woke up from sleep at 2am with severe pain to R side with nausea and vomiting.  Denies diarrhea and urinary complaints.

## 2019-01-31 NOTE — ED Notes (Signed)
Awaiting medication from pharmacy.

## 2019-02-12 ENCOUNTER — Ambulatory Visit: Payer: Self-pay | Admitting: Urology

## 2019-02-12 ENCOUNTER — Encounter: Payer: Self-pay | Admitting: Urology

## 2019-03-01 NOTE — Progress Notes (Deleted)
Patient ID: Victor Hale, male   DOB: [birth year], 43 y.o.   MRN: [id number] Virtual Visit via Video Note  I connected with@ on 03/01/19 at@ by a video enabled telemedicine application and verified that I am speaking with the correct person using two identifiers.   Consent:  I discussed the limitations, risks, security and privacy concerns of performing an evaluation and management service by video visit and the availability of in person appointments. I also discussed with the patient that there may be a patient responsible charge related to this service. The patient expressed understanding and agreed to proceed.  Location of patient:  Location of provider:  Persons participating in the televisit with the patient.       History of Present Illness: 43 year old male presents with acute right flank pain that woke him up from sleep this morning.  He is mildly hypertensive but otherwise vital signs are normal.  He is in mild distress due to pain and has right CVA tenderness.  CBC is remarkable for mild leukocytosis of 12.8.  CMP is remarkable for mild hyperglycemia and mild AKI of 1.3.  Slight transaminitis probably from fatty liver.   UA has moderate hemoglobin - no signs of UTI.  Will obtain CT renal and provide pain control.  CT shows 59mm distal stone causing mild hydro on the right. Pain is still uncontrolled. Will give another dose of Dilaudid.  RN reports pain is still uncontrolled. Will try IV Lidocaine.  11:41 AM Pt reports pain is 6/10. He feels okay to go home. Will give Percocet before he goes and then will rx pain medicine, Zofran, Flomax. He was encouraged to f/u with Urology. Advised return if worsening   Observations/Objective:   Assessment and Plan:   Follow Up Instructions:    I discussed the assessment and treatment plan with the patient. The patient was provided an opportunity to ask questions and all were answered. The patient agreed with the plan and demonstrated  an understanding of the instructions.   The patient was advised to call back or seek an in-person evaluation if the symptoms worsen or if the condition fails to improve as anticipated.  I provided *** minutes of non-face-to-face time during this encounter  including  median intraservice time , review of notes, labs, imaging, medications  and explaining diagnosis and management to the patient .    Asencion Noble, MD

## 2019-03-02 ENCOUNTER — Telehealth: Payer: Self-pay | Admitting: Critical Care Medicine

## 2019-03-02 ENCOUNTER — Encounter: Payer: Self-pay | Admitting: Critical Care Medicine

## 2019-03-02 NOTE — Progress Notes (Signed)
This patient was a no show

## 2019-06-23 ENCOUNTER — Ambulatory Visit: Payer: Self-pay | Attending: Family Medicine | Admitting: Pharmacist

## 2019-06-23 ENCOUNTER — Encounter: Payer: Self-pay | Admitting: Family Medicine

## 2019-06-23 ENCOUNTER — Other Ambulatory Visit: Payer: Self-pay

## 2019-06-23 ENCOUNTER — Ambulatory Visit: Payer: Self-pay | Attending: Family Medicine | Admitting: Family Medicine

## 2019-06-23 VITALS — BP 181/120 | HR 84 | Temp 98.2°F | Ht 74.0 in | Wt 364.0 lb

## 2019-06-23 DIAGNOSIS — I10 Essential (primary) hypertension: Secondary | ICD-10-CM

## 2019-06-23 DIAGNOSIS — M25562 Pain in left knee: Secondary | ICD-10-CM

## 2019-06-23 DIAGNOSIS — G5602 Carpal tunnel syndrome, left upper limb: Secondary | ICD-10-CM

## 2019-06-23 DIAGNOSIS — G8929 Other chronic pain: Secondary | ICD-10-CM

## 2019-06-23 DIAGNOSIS — E119 Type 2 diabetes mellitus without complications: Secondary | ICD-10-CM

## 2019-06-23 DIAGNOSIS — M25561 Pain in right knee: Secondary | ICD-10-CM

## 2019-06-23 DIAGNOSIS — Z23 Encounter for immunization: Secondary | ICD-10-CM

## 2019-06-23 DIAGNOSIS — R600 Localized edema: Secondary | ICD-10-CM

## 2019-06-23 DIAGNOSIS — Z7189 Other specified counseling: Secondary | ICD-10-CM

## 2019-06-23 LAB — POCT GLYCOSYLATED HEMOGLOBIN (HGB A1C): HbA1c, POC (controlled diabetic range): 6.5 % (ref 0.0–7.0)

## 2019-06-23 MED ORDER — METFORMIN HCL 500 MG PO TABS: 500.0000 mg | ORAL_TABLET | Freq: Two times a day (BID) | ORAL | 3 refills | Status: DC

## 2019-06-23 MED ORDER — TRUEPLUS LANCETS 28G MISC: 1.0000 | Freq: Three times a day (TID) | 12 refills | Status: AC

## 2019-06-23 MED ORDER — TRUE METRIX METER DEVI: 1.0000 | Freq: Every day | 12 refills | Status: DC

## 2019-06-23 MED ORDER — GABAPENTIN 300 MG PO CAPS: 300.0000 mg | ORAL_CAPSULE | Freq: Two times a day (BID) | ORAL | 3 refills | Status: DC

## 2019-06-23 MED ORDER — TRUE METRIX BLOOD GLUCOSE TEST VI STRP: ORAL_STRIP | 12 refills | Status: DC

## 2019-06-23 MED ORDER — LISINOPRIL-HYDROCHLOROTHIAZIDE 20-25 MG PO TABS: 1.0000 | ORAL_TABLET | Freq: Every day | ORAL | 3 refills | Status: DC

## 2019-06-23 MED ORDER — ATORVASTATIN CALCIUM 20 MG PO TABS: 20.0000 mg | ORAL_TABLET | Freq: Every day | ORAL | 3 refills | Status: DC

## 2019-06-23 MED ORDER — MELOXICAM 7.5 MG PO TABS: 7.5000 mg | ORAL_TABLET | Freq: Every day | ORAL | 1 refills | Status: DC

## 2019-06-23 MED ORDER — TRUE METRIX METER DEVI: 1.0000 | Freq: Every day | 12 refills | Status: AC

## 2019-06-23 MED ORDER — TRUEPLUS LANCETS 28G MISC: 1.0000 | Freq: Three times a day (TID) | 12 refills | Status: DC

## 2019-06-23 MED FILL — ATORVASTATIN CALCIUM 20 MG: 20 | 30 days supply | Qty: 30 | Fill #0

## 2019-06-23 MED FILL — LISINOPRIL-HCTZ 20-25 MG TA: 20-25 | 30 days supply | Qty: 30 | Fill #0

## 2019-06-23 MED FILL — TRUE METRIX TEST STRIP: 100 days supply | Qty: 100 | Fill #0

## 2019-06-23 MED FILL — MELOXICAM 7.5 MG TABLET: 7.5 | 30 days supply | Qty: 30 | Fill #0

## 2019-06-23 MED FILL — GABAPENTIN 300 MG CAPSULE: 300 | 30 days supply | Qty: 60 | Fill #0

## 2019-06-23 MED FILL — !TRUE METRIX BLOOD GLUCOSE: 1 days supply | Qty: 1 | Fill #0

## 2019-06-23 MED FILL — TRUEplus LANCETS 28G MISC: 33 days supply | Qty: 100 | Fill #0

## 2019-06-23 MED FILL — metFORMIN HCL 500 MG TABS: 500 | 30 days supply | Qty: 60 | Fill #0

## 2019-06-23 NOTE — Progress Notes (Signed)
Feet and knees swelling. Pain in arms.

## 2019-06-23 NOTE — Patient Instructions (Signed)

## 2019-06-23 NOTE — Progress Notes (Signed)
Subjective:  Patient ID: Victor Hale, male    DOB: 1975/11/17  Age: 43 y.o. MRN: [id number]  CC: New Patient (Initial Visit)   HPI Victor Hale is a 43 year old male with a history of obesity, renal calculi who presents today to establish care. He complains of pedal edema in his feet and knees which have been present for several months and improve slightly on waking up but accumulates rapidly up to 15 minutes after wearing his sneakers.  He is now having to wear slides due to that.  Denies dyspnea or excessive weight gain. He complains of intermittent knee pain and swelling described as moderate and endorses playing a lot of sports when he was younger.  Knee pain is absent now. He complains of left arm numbness and pain with associated tingling and he has to keep on making a fist and straightening his fingers for relief of symptoms and also wakes up at night to shake his wrist for relief.  A1c performed in clinic returned at 6.5 with a new diagnosis of type 2 diabetes mellitus.  He states he does have a strong family history. His blood pressure is also significantly elevated at 181/120 and his blood pressure at the ED 5 months ago was 138/97.  He is currently not on any antihypertensive.  Past Medical History:  Diagnosis Date  . Fatty liver     History reviewed. No pertinent surgical history.  Family History  Problem Relation Age of Onset  . Hypertension Mother   . Diabetes Mother     Allergies  Allergen Reactions  . Penicillins Itching    Outpatient Medications Prior to Visit  Medication Sig Dispense Refill  . lidocaine (XYLOCAINE) 2 % jelly Apply topically.    . ondansetron (ZOFRAN ODT) 4 MG disintegrating tablet Take 1 tablet (4 mg total) by mouth every 8 (eight) hours as needed for nausea or vomiting. (Patient not taking: Reported on 06/23/2019) 8 tablet 0  . oxyCODONE-acetaminophen (PERCOCET/ROXICET) 5-325 MG tablet Take 1 tablet by mouth every 4 (four) hours as needed  for severe pain. (Patient not taking: Reported on 06/23/2019) 15 tablet 0  . tamsulosin (FLOMAX) 0.4 MG CAPS capsule Take 1 capsule (0.4 mg total) by mouth daily. (Patient not taking: Reported on 06/23/2019) 7 capsule 0   No facility-administered medications prior to visit.      ROS Review of Systems  Constitutional: Negative for activity change and appetite change.  HENT: Negative for sinus pressure and sore throat.   Eyes: Negative for visual disturbance.  Respiratory: Negative for cough, chest tightness and shortness of breath.   Cardiovascular: Negative for chest pain and leg swelling.  Gastrointestinal: Negative for abdominal distention, abdominal pain, constipation and diarrhea.  Endocrine: Negative.   Genitourinary: Negative for dysuria.  Musculoskeletal: Negative for joint swelling and myalgias.  Skin: Negative for rash.  Allergic/Immunologic: Negative.   Neurological: Negative for weakness, light-headedness and numbness.  Psychiatric/Behavioral: Negative for dysphoric mood and suicidal ideas.    Objective:  BP (!) 181/120   Pulse 84   Temp 98.2 F (36.8 C) (Oral)   Ht 6\' 2"  (1.88 m)   Wt (!) 364 lb (165.1 kg)   SpO2 95%   BMI 46.73 kg/m   BP/Weight 06/23/2019 01/31/2019 [id number]  Systolic BP 0000000 94 123456  Diastolic BP 123456 57 91  Wt. (Lbs) 364 261 -  BMI 46.73 33.51 -      Physical Exam Constitutional:      Appearance: He is well-developed.  Neck:  Vascular: No JVD.  Cardiovascular:     Rate and Rhythm: Normal rate.     Heart sounds: Normal heart sounds. No murmur.  Pulmonary:     Effort: Pulmonary effort is normal.     Breath sounds: Normal breath sounds. No wheezing or rales.  Chest:     Chest wall: No tenderness.  Abdominal:     General: Bowel sounds are normal. There is no distension.     Palpations: Abdomen is soft. There is no mass.     Tenderness: There is no abdominal tenderness.  Musculoskeletal: Normal range of motion.     Right lower  leg: No edema.     Left lower leg: No edema.  Neurological:     Mental Status: He is alert and oriented to person, place, and time.  Psychiatric:        Mood and Affect: Mood normal.     CMP Latest Ref Rng & Units 01/31/2019 07/01/2017 09/17/2016  Glucose 70 - 99 mg/dL 179(H) 76 96  BUN 6 - 20 mg/dL 15 8 11   Creatinine 0.61 - 1.24 mg/dL 1.33(H) 0.84 1.09  Sodium 135 - 145 mmol/L 138 135 138  Potassium 3.5 - 5.1 mmol/L 3.6 4.3 3.3(L)  Chloride 98 - 111 mmol/L 102 104 107  CO2 22 - 32 mmol/L 23 20(L) 21(L)  Calcium 8.9 - 10.3 mg/dL 9.3 8.9 9.3  Total Protein 6.5 - 8.1 g/dL 7.1 - 7.5  Total Bilirubin 0.3 - 1.2 mg/dL 0.5 - 0.8  Alkaline Phos 38 - 126 U/L 50 - 42  AST 15 - 41 U/L 57(H) - 24  ALT 0 - 44 U/L 73(H) - 32    Lipid Panel  No results found for: CHOL, TRIG, HDL, CHOLHDL, VLDL, LDLCALC, LDLDIRECT  CBC    Component Value Date/Time   WBC 12.8 (H) 01/31/2019 0536   RBC 4.56 01/31/2019 0536   HGB 14.3 01/31/2019 0536   HGB 14.6 06/20/2014 1422   HCT 42.4 01/31/2019 0536   HCT 43.0 06/20/2014 1422   PLT 287 01/31/2019 0536   PLT 251 06/20/2014 1422   MCV 93.0 01/31/2019 0536   MCV 94 06/20/2014 1422   MCH 31.4 01/31/2019 0536   MCHC 33.7 01/31/2019 0536   RDW 12.4 01/31/2019 0536   RDW 12.9 06/20/2014 1422   LYMPHSABS 2.1 09/17/2016 1218   LYMPHSABS 2.0 06/20/2014 1422   MONOABS 1.6 (H) 09/17/2016 1218   MONOABS 0.7 06/20/2014 1422   EOSABS 0.0 09/17/2016 1218   EOSABS 0.2 06/20/2014 1422   BASOSABS 0.0 09/17/2016 1218   BASOSABS 0.1 06/20/2014 1422    Lab Results  Component Value Date   HGBA1C 6.5 06/23/2019    Assessment & Plan:   1. Type 2 diabetes mellitus without complication, without long-term current use of insulin ([place]) New diagnosis Provided counseling with regards to diagnosis of diabetes mellitus, dietary modifications and clinical pharmacist called in to reinforce teaching We will commence metformin and Lipitor. - HgB A1c - atorvastatin  (LIPITOR) 20 MG tablet; Take 1 tablet (20 mg total) by mouth daily.  Dispense: 30 tablet; Refill: 3 - Blood Glucose Monitoring Suppl (TRUE METRIX METER) DEVI; 1 each by Does not apply route daily before breakfast.  Dispense: 100 each; Refill: 12 - glucose blood (TRUE METRIX BLOOD GLUCOSE TEST) test strip; Use daily before breakfast  Dispense: 100 each; Refill: 12 - metFORMIN (GLUCOPHAGE) 500 MG tablet; Take 1 tablet (500 mg total) by mouth 2 (two) times daily with a meal.  Dispense: 60 tablet; Refill: 3 - TRUEplus Lancets 28G MISC; 1 each by Does not apply route 3 (three) times daily before meals.  Dispense: 100 each; Refill: 12  2. Essential hypertension Uncontrolled Blood pressure has been elevated previously at the ED visit and again today Commence antihypertensive Counseled on blood pressure goal of less than 130/80, low-sodium, DASH diet, medication compliance, 150 minutes of moderate intensity exercise per week. Discussed medication compliance, adverse effects. - lisinopril-hydrochlorothiazide (ZESTORETIC) 20-25 MG tablet; Take 1 tablet by mouth daily.  Dispense: 30 tablet; Refill: 3  3. Chronic pain of both knees Likely osteoarthritis Placed on NSAIDs, use knee brace - meloxicam (MOBIC) 7.5 MG tablet; Take 1 tablet (7.5 mg total) by mouth daily.  Dispense: 30 tablet; Refill: 1  4. Carpal tunnel syndrome of left wrist Advised to use wrist splint Placed on NSAID - gabapentin (NEURONTIN) 300 MG capsule; Take 1 capsule (300 mg total) by mouth 2 (two) times daily.  Dispense: 60 capsule; Refill: 3  5. Pedal edema Suspicious for dependent edema We will evaluate for cardiac etiology with BNP Hopefully initiation of diuretic and his combo antihypertensive will be beneficial Continue using compression stockings, elevate feet Counseled on low-sodium diet - Complete Metabolic Panel with GFR - Brain natriuretic peptide    Meds ordered this encounter  Medications  . DISCONTD:  lisinopril-hydrochlorothiazide (ZESTORETIC) 20-25 MG tablet    Sig: Take 1 tablet by mouth daily.    Dispense:  30 tablet    Refill:  3  . DISCONTD: metFORMIN (GLUCOPHAGE) 500 MG tablet    Sig: Take 1 tablet (500 mg total) by mouth 2 (two) times daily with a meal.    Dispense:  60 tablet    Refill:  3  . DISCONTD: meloxicam (MOBIC) 7.5 MG tablet    Sig: Take 1 tablet (7.5 mg total) by mouth daily.    Dispense:  30 tablet    Refill:  1  . DISCONTD: gabapentin (NEURONTIN) 300 MG capsule    Sig: Take 1 capsule (300 mg total) by mouth 2 (two) times daily.    Dispense:  60 capsule    Refill:  3  . DISCONTD: atorvastatin (LIPITOR) 20 MG tablet    Sig: Take 1 tablet (20 mg total) by mouth daily.    Dispense:  30 tablet    Refill:  3  . DISCONTD: glucose blood (TRUE METRIX BLOOD GLUCOSE TEST) test strip    Sig: Use daily before breakfast    Dispense:  100 each    Refill:  12  . DISCONTD: Blood Glucose Monitoring Suppl (TRUE METRIX METER) DEVI    Sig: 1 each by Does not apply route daily before breakfast.    Dispense:  100 each    Refill:  12  . DISCONTD: TRUEplus Lancets 28G MISC    Sig: 1 each by Does not apply route 3 (three) times daily before meals.    Dispense:  100 each    Refill:  12  . atorvastatin (LIPITOR) 20 MG tablet    Sig: Take 1 tablet (20 mg total) by mouth daily.    Dispense:  30 tablet    Refill:  3  . Blood Glucose Monitoring Suppl (TRUE METRIX METER) DEVI    Sig: 1 each by Does not apply route daily before breakfast.    Dispense:  100 each    Refill:  12  . gabapentin (NEURONTIN) 300 MG capsule    Sig: Take 1 capsule (300 mg total) by mouth  2 (two) times daily.    Dispense:  60 capsule    Refill:  3  . glucose blood (TRUE METRIX BLOOD GLUCOSE TEST) test strip    Sig: Use daily before breakfast    Dispense:  100 each    Refill:  12  . lisinopril-hydrochlorothiazide (ZESTORETIC) 20-25 MG tablet    Sig: Take 1 tablet by mouth daily.    Dispense:  30 tablet     Refill:  3  . meloxicam (MOBIC) 7.5 MG tablet    Sig: Take 1 tablet (7.5 mg total) by mouth daily.    Dispense:  30 tablet    Refill:  1  . metFORMIN (GLUCOPHAGE) 500 MG tablet    Sig: Take 1 tablet (500 mg total) by mouth 2 (two) times daily with a meal.    Dispense:  60 tablet    Refill:  3  . TRUEplus Lancets 28G MISC    Sig: 1 each by Does not apply route 3 (three) times daily before meals.    Dispense:  100 each    Refill:  12    Follow-up: Return in about 1 month (around 07/23/2019) for hypertension.       Charlott Rakes, MD, FAAFP. [facility] [place]   06/23/2019, 11:09 AM

## 2019-06-23 NOTE — Progress Notes (Signed)
Patient was educated on lifestyle modifications needed to improve glycemic control including MyPlate. Education provided on metformin use. Recommended 150 mins/wk of aerobic exercise. All questions and concerns were addressed.

## 2019-06-24 LAB — CMP14+EGFR
ALT: 73 IU/L — ABNORMAL HIGH (ref 0–44)
AST: 53 IU/L — ABNORMAL HIGH (ref 0–40)
Albumin/Globulin Ratio: 1.5 (ref 1.2–2.2)
Albumin: 4.5 g/dL (ref 4.0–5.0)
Alkaline Phosphatase: 55 IU/L (ref 39–117)
BUN/Creatinine Ratio: 11 (ref 9–20)
BUN: 9 mg/dL (ref 6–24)
Bilirubin Total: 0.4 mg/dL (ref 0.0–1.2)
CO2: 19 mmol/L — ABNORMAL LOW (ref 20–29)
Calcium: 9.7 mg/dL (ref 8.7–10.2)
Chloride: 104 mmol/L (ref 96–106)
Creatinine, Ser: 0.84 mg/dL (ref 0.76–1.27)
GFR calc Af Amer: 124 mL/min/{1.73_m2} (ref >59)
GFR calc non Af Amer: 107 mL/min/{1.73_m2} (ref >59)
Globulin, Total: 3.1 g/dL (ref 1.5–4.5)
Glucose: 116 mg/dL — ABNORMAL HIGH (ref 65–99)
Potassium: 4.3 mmol/L (ref 3.5–5.2)
Sodium: 141 mmol/L (ref 134–144)
Total Protein: 7.6 g/dL (ref 6.0–8.5)

## 2019-06-24 LAB — BRAIN NATRIURETIC PEPTIDE: BNP: 30.5 pg/mL (ref 0.0–100.0)

## 2019-06-26 ENCOUNTER — Telehealth: Payer: Self-pay

## 2019-06-26 NOTE — Telephone Encounter (Signed)
-----   Message from Charlott Rakes, MD sent at 06/25/2019  8:46 AM EST ----- Labs are negative for evidence of heart failure.  Liver enzymes are slightly elevated which was present on his previous labs.  We will continue to monitor this meanwhile he is advised to work on weight loss as fatty liver could also contribute to elevated liver enzymes.

## 2019-06-26 NOTE — Telephone Encounter (Signed)
Patient was called to go over lab results and message states that call can not be completed at this time.

## 2019-07-11 IMAGING — CT CT RENAL STONE PROTOCOL
2 of 4 series · 16 of 46 positions shown, 18 images · non-contrast
Comparison: CT abdomen dated 09/11/2016.

CLINICAL DATA: Sudden onset of RIGHT-sided flank pain this morning.
Nausea. Hematuria.

EXAM:
CT ABDOMEN AND PELVIS WITHOUT CONTRAST
TECHNIQUE: Multidetector CT imaging of the abdomen and pelvis was performed
following the standard protocol without IV contrast.

[Series 3: ap without · axial · non-contrast · 0.94mm/px · z∈[-1068,-592]mm · 13 of 107 slices shown, 15 images]
[im 6/107  soft-tissue]
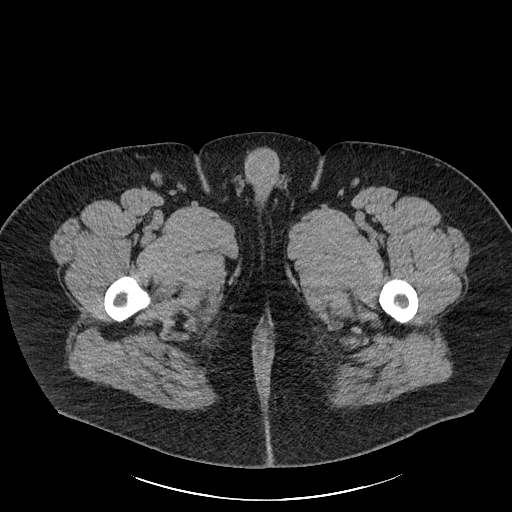
[im 6/107  bone]
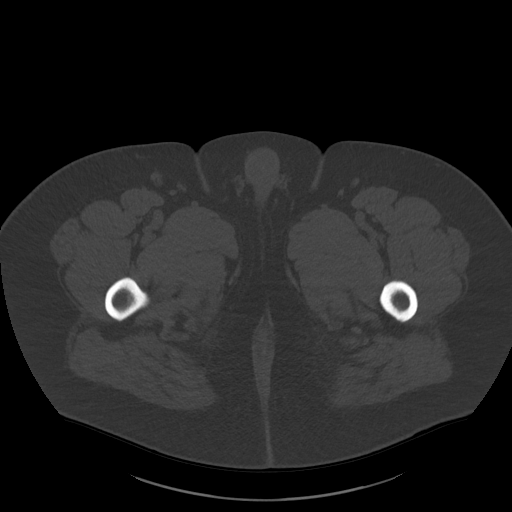
[im 17/107  soft-tissue]
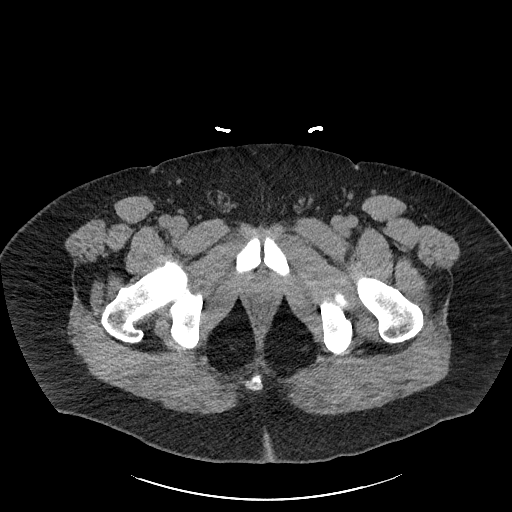
[im 23/107  soft-tissue]
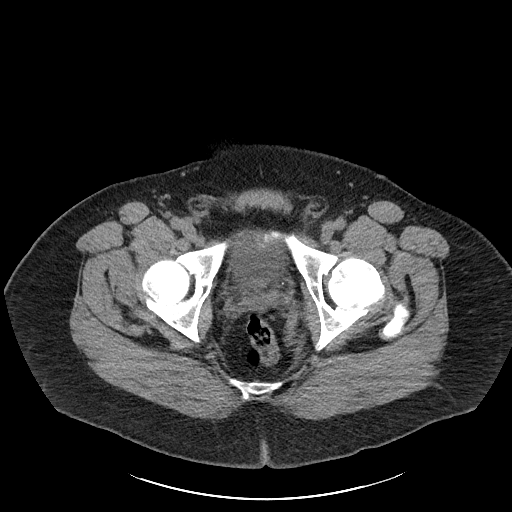
[im 28/107  soft-tissue]
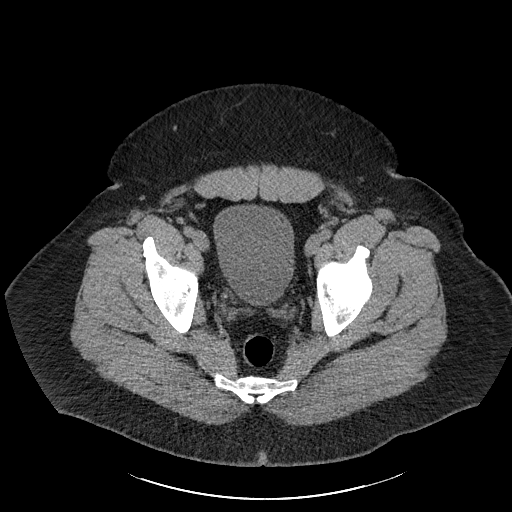
[im 40/107  soft-tissue]
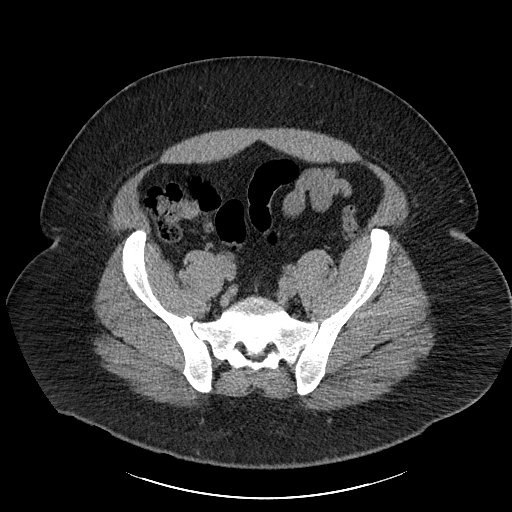
[im 45/107  soft-tissue]
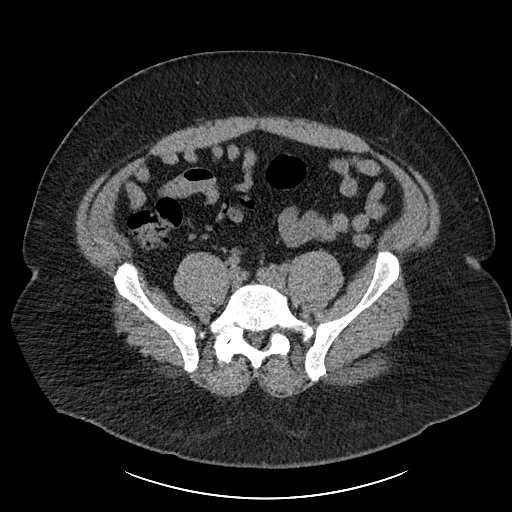
[im 56/107  soft-tissue]
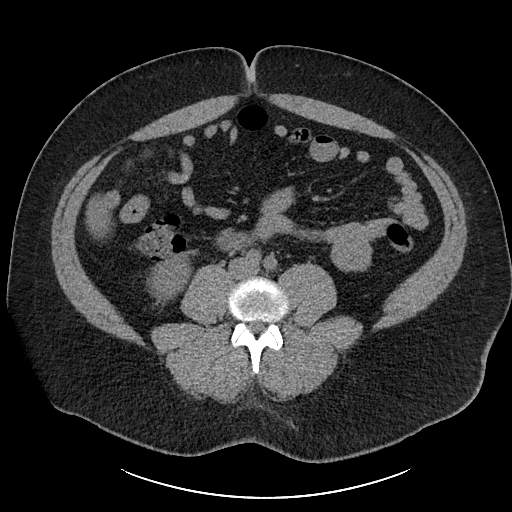
[im 62/107  soft-tissue]
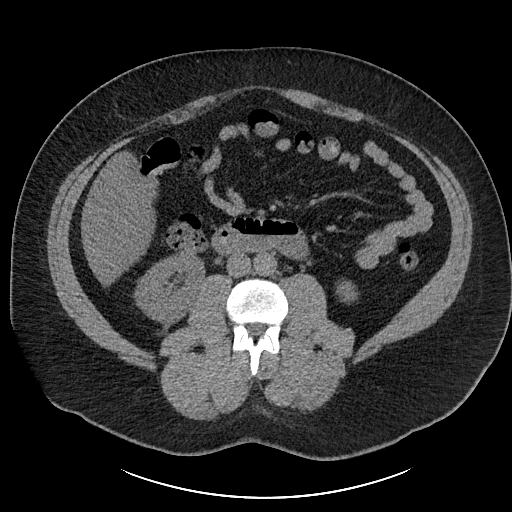
[im 67/107  soft-tissue]
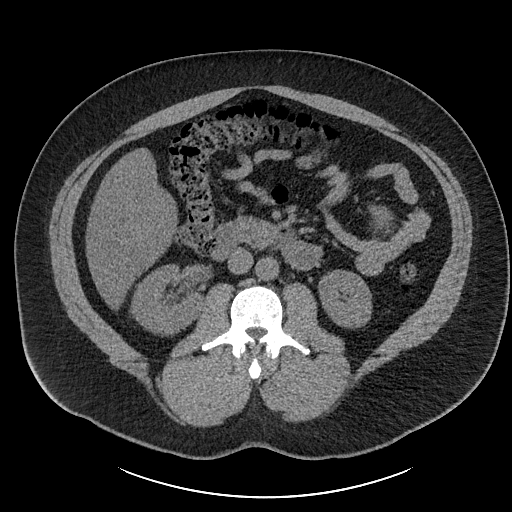
[im 67/107  bone]
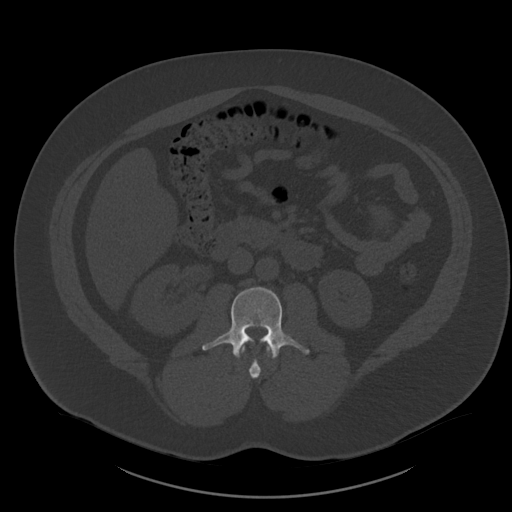
[im 79/107  soft-tissue]
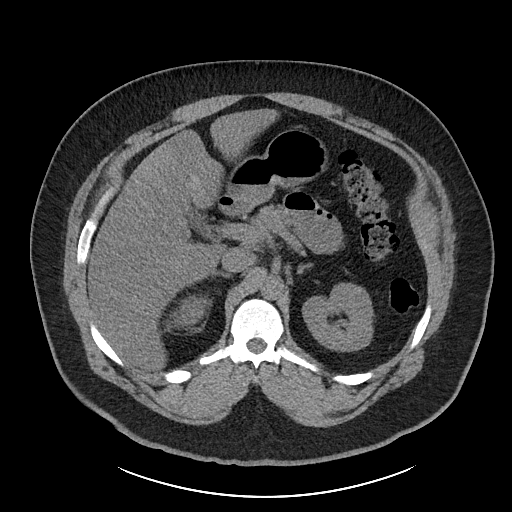
[im 84/107  soft-tissue]
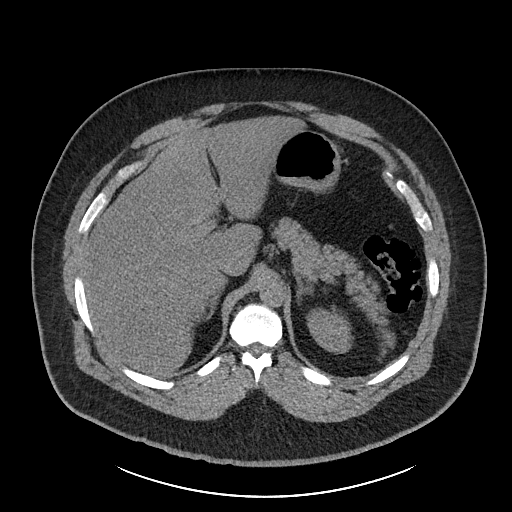
[im 90/107  soft-tissue]
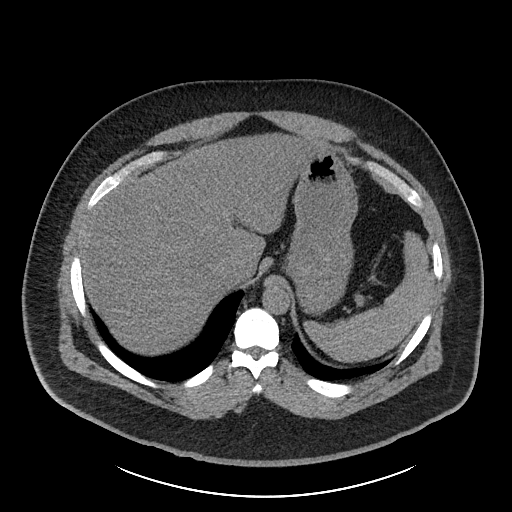
[im 101/107  soft-tissue]
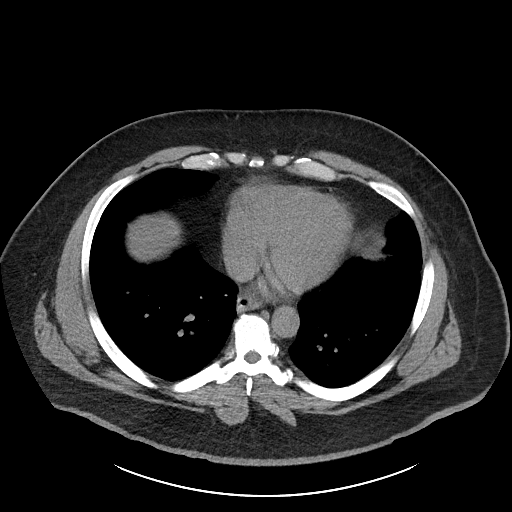

[Series 6: cor · coronal · 0.91mm/px · 3 of 103 slices shown]
[im 35/103  soft-tissue]
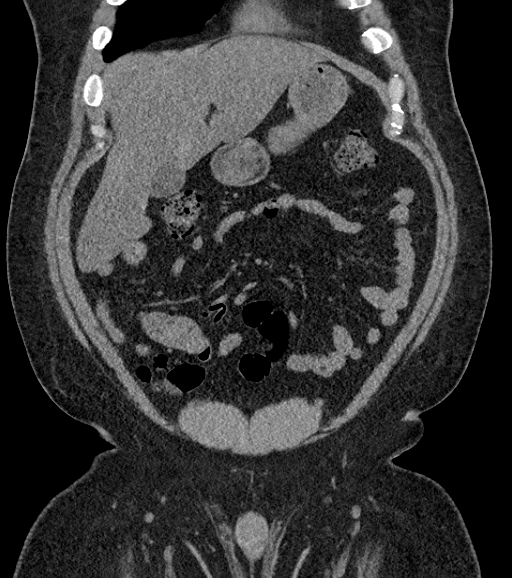
[im 46/103  soft-tissue]
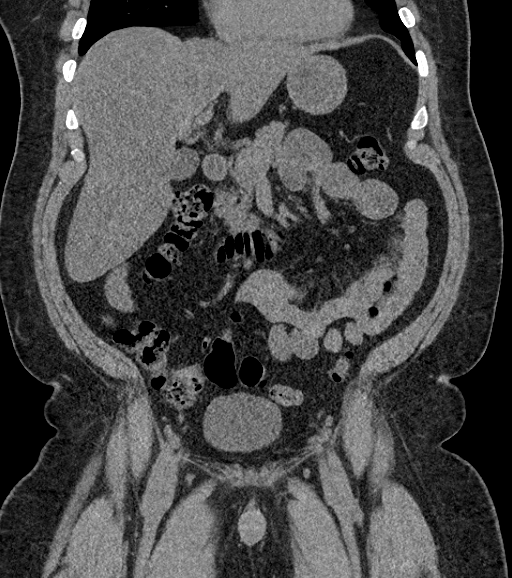
[im 57/103  soft-tissue]
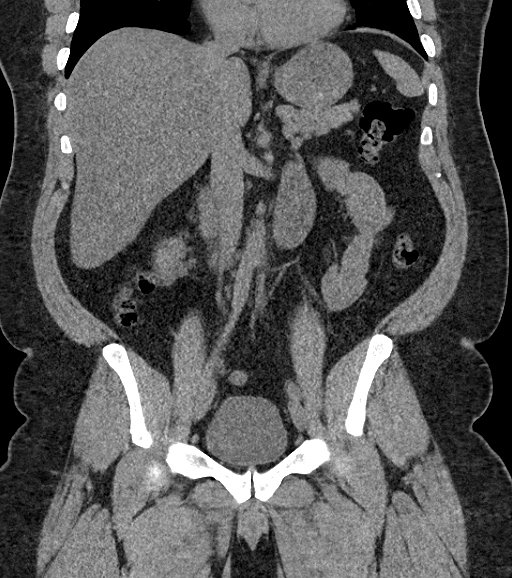

[16 of 46 positions shown; findings below may reference images not displayed]

FINDINGS: Lower chest: No acute abnormality.

Hepatobiliary: Liver is low in density suggesting fatty
infiltration. Gallbladder appears normal. No bile duct dilatation
seen.

Pancreas: Unremarkable. No pancreatic ductal dilatation or
surrounding inflammatory changes.

Spleen: Normal in size without focal abnormality.

Adrenals/Urinary Tract: Adrenal glands appear normal. 1 mm stone
within the distal RIGHT ureter, just proximal to the RIGHT UVJ,
causing mild hydronephrosis and perinephric inflammation.

9 mm nonobstructing LEFT renal stone. Bladder appears normal,
partially decompressed.

Stomach/Bowel: No dilated large or small bowel loops. No evidence of
bowel wall inflammation. Appendix is normal. Stomach is
unremarkable, partially decompressed.

Vascular/Lymphatic: No significant vascular findings are present. No
enlarged abdominal or pelvic lymph nodes.

Reproductive: Prostate is unremarkable.

Other: No free fluid or abscess collection. No free intraperitoneal
air.

Musculoskeletal: No acute or suspicious osseous finding.
IMPRESSION: 1. 1 mm stone within the distal right ureter, just proximal to the
right UVJ, causing mild hydronephrosis and perinephric inflammation.
2. 9 mm nonobstructing left renal stone.
3. Fatty infiltration of the liver.

## 2019-07-27 ENCOUNTER — Encounter: Payer: Self-pay | Admitting: Family Medicine

## 2019-07-27 ENCOUNTER — Ambulatory Visit: Payer: Self-pay | Attending: Family Medicine | Admitting: Family Medicine

## 2019-07-27 VITALS — BP 147/91 | HR 92 | Temp 98.2°F | Ht 74.0 in | Wt 367.2 lb

## 2019-07-27 DIAGNOSIS — G5602 Carpal tunnel syndrome, left upper limb: Secondary | ICD-10-CM

## 2019-07-27 DIAGNOSIS — E119 Type 2 diabetes mellitus without complications: Secondary | ICD-10-CM

## 2019-07-27 DIAGNOSIS — N200 Calculus of kidney: Secondary | ICD-10-CM

## 2019-07-27 DIAGNOSIS — M62838 Other muscle spasm: Secondary | ICD-10-CM

## 2019-07-27 DIAGNOSIS — R600 Localized edema: Secondary | ICD-10-CM

## 2019-07-27 LAB — POCT URINALYSIS DIP (CLINITEK)
Bilirubin, UA: NEGATIVE
Blood, UA: NEGATIVE
Glucose, UA: NEGATIVE mg/dL
Ketones, POC UA: NEGATIVE mg/dL
Leukocytes, UA: NEGATIVE
Nitrite, UA: NEGATIVE
POC PROTEIN,UA: NEGATIVE
Spec Grav, UA: >=1.03 — AB (ref 1.010–1.025)
Urobilinogen, UA: 0.2 E.U./dL
pH, UA: 5 (ref 5.0–8.0)

## 2019-07-27 LAB — GLUCOSE, POCT (MANUAL RESULT ENTRY): POC Glucose: 131 mg/dl — AB (ref 70–99)

## 2019-07-27 MED ORDER — GABAPENTIN 300 MG PO CAPS: 600.0000 mg | ORAL_CAPSULE | Freq: Two times a day (BID) | ORAL | 3 refills | Status: DC

## 2019-07-27 MED ORDER — FUROSEMIDE 20 MG PO TABS: 20.0000 mg | ORAL_TABLET | Freq: Every day | ORAL | 3 refills | Status: DC

## 2019-07-27 MED ORDER — METHOCARBAMOL 500 MG PO TABS: 500.0000 mg | ORAL_TABLET | Freq: Three times a day (TID) | ORAL | 2 refills | Status: DC | PRN

## 2019-07-27 MED FILL — FUROSEMIDE 20 MG TABS: 20 | 30 days supply | Qty: 30 | Fill #0

## 2019-07-27 MED FILL — METHOCARBAMOL 500 MG TABS: 500 | 20 days supply | Qty: 60 | Fill #0

## 2019-07-27 MED FILL — GABAPENTIN 300 MG CAPSULE: 300 | 30 days supply | Qty: 120 | Fill #0

## 2019-07-27 NOTE — Patient Instructions (Signed)
Edema  Edema is when you have too much fluid in your body or under your skin. Edema may make your legs, feet, and ankles swell up. Swelling is also common in looser tissues, like around your eyes. This is a common condition. It gets more common as you get older. There are many possible causes of edema. Eating too much salt (sodium) and being on your feet or sitting for a long time can cause edema in your legs, feet, and ankles. Hot weather may make edema worse. Edema is usually painless. Your skin may look swollen or shiny. Follow these instructions at home:  Keep the swollen body part raised (elevated) above the level of your heart when you are sitting or lying down.  Do not sit still or stand for a long time.  Do not wear tight clothes. Do not wear garters on your upper legs.  Exercise your legs. This can help the swelling go down.  Wear elastic bandages or support stockings as told by your doctor.  Eat a low-salt (low-sodium) diet to reduce fluid as told by your doctor.  Depending on the cause of your swelling, you may need to limit how much fluid you drink (fluid restriction).  Take over-the-counter and prescription medicines only as told by your doctor. Contact a doctor if:  Treatment is not working.  You have heart, liver, or kidney disease and have symptoms of edema.  You have sudden and unexplained weight gain. Get help right away if:  You have shortness of breath or chest pain.  You cannot breathe when you lie down.  You have pain, redness, or warmth in the swollen areas.  You have heart, liver, or kidney disease and get edema all of a sudden.  You have a fever and your symptoms get worse all of a sudden. Summary  Edema is when you have too much fluid in your body or under your skin.  Edema may make your legs, feet, and ankles swell up. Swelling is also common in looser tissues, like around your eyes.  Raise (elevate) the swollen body part above the level of your  heart when you are sitting or lying down.  Follow your doctor's instructions about diet and how much fluid you can drink (fluid restriction). This information is not intended to replace advice given to you by your health care provider. Make sure you discuss any questions you have with your health care provider. Document Released: 01/09/2008 Document Revised: 07/26/2017 Document Reviewed: 08/10/2016 Elsevier Patient Education  2020 [employer]  

## 2019-07-27 NOTE — Progress Notes (Signed)
Patient states that he is having pain all over his body.  Patient states that is legs are swelling.

## 2019-07-27 NOTE — Progress Notes (Signed)
Subjective:  Patient ID: Victor Hale, male    DOB: 11-Dec-1975  Age: 43 y.o. MRN: [id number]  CC: Hypertension   HPI Victor Hale is a 43 year old male with a history of obesity, renal calculi, hypertension, newly diagnosed type 2 diabetes mellitus (A1c 6.5) who establish care 1 month ago here in the clinic. He was commenced on lisinopril/HCTZ with improvement in his blood pressure and with the hopes that the diuretic would help with his pedal edema however he complains pedal edema still persists and he is unable to put on his shoes.  He can feel edema as soon as he puts on his shoes and attempts to stand up and walk.  BNP from his last visit was 30.5. He was commenced on Metformin for management of his diabetes mellitus.  Today he complains he has pain in his right side and this occurs when he turns suddenly especially in the shower. He has prickly sensations in hands with cramping and he can't straighten out his fingers. Also has same sensation in his thighs.  Cramping of his hands are not precipitated by activity and resolves spontaneously.  At his last visit he had given the positive history of the flick sign.  Past Medical History:  Diagnosis Date  . Fatty liver     No past surgical history on file.  Family History  Problem Relation Age of Onset  . Hypertension Mother   . Diabetes Mother     Allergies  Allergen Reactions  . Penicillins Itching    Outpatient Medications Prior to Visit  Medication Sig Dispense Refill  . atorvastatin (LIPITOR) 20 MG tablet Take 1 tablet (20 mg total) by mouth daily. 30 tablet 3  . Blood Glucose Monitoring Suppl (TRUE METRIX METER) DEVI 1 each by Does not apply route daily before breakfast. 100 each 12  . gabapentin (NEURONTIN) 300 MG capsule Take 1 capsule (300 mg total) by mouth 2 (two) times daily. 60 capsule 3  . glucose blood (TRUE METRIX BLOOD GLUCOSE TEST) test strip Use daily before breakfast 100 each 12  . lidocaine (XYLOCAINE) 2 %  jelly Apply topically.    Marland Kitchen lisinopril-hydrochlorothiazide (ZESTORETIC) 20-25 MG tablet Take 1 tablet by mouth daily. 30 tablet 3  . meloxicam (MOBIC) 7.5 MG tablet Take 1 tablet (7.5 mg total) by mouth daily. 30 tablet 1  . metFORMIN (GLUCOPHAGE) 500 MG tablet Take 1 tablet (500 mg total) by mouth 2 (two) times daily with a meal. 60 tablet 3  . ondansetron (ZOFRAN ODT) 4 MG disintegrating tablet Take 1 tablet (4 mg total) by mouth every 8 (eight) hours as needed for nausea or vomiting. 8 tablet 0  . TRUEplus Lancets 28G MISC 1 each by Does not apply route 3 (three) times daily before meals. 100 each 12  . oxyCODONE-acetaminophen (PERCOCET/ROXICET) 5-325 MG tablet Take 1 tablet by mouth every 4 (four) hours as needed for severe pain. (Patient not taking: Reported on 06/23/2019) 15 tablet 0  . tamsulosin (FLOMAX) 0.4 MG CAPS capsule Take 1 capsule (0.4 mg total) by mouth daily. (Patient not taking: Reported on 06/23/2019) 7 capsule 0   No facility-administered medications prior to visit.     ROS Review of Systems  Constitutional: Negative for activity change and appetite change.  HENT: Negative for sinus pressure and sore throat.   Eyes: Negative for visual disturbance.  Respiratory: Negative for cough, chest tightness and shortness of breath.   Cardiovascular: Positive for leg swelling. Negative for chest pain.  Gastrointestinal: Negative  for abdominal distention, abdominal pain, constipation and diarrhea.  Endocrine: Negative.   Genitourinary: Negative for dysuria.  Musculoskeletal:       See HPI  Skin: Negative for rash.  Allergic/Immunologic: Negative.   Neurological: Negative for weakness, light-headedness and numbness.  Psychiatric/Behavioral: Negative for dysphoric mood and suicidal ideas.    Objective:  BP (!) 147/91   Pulse 92   Temp 98.2 F (36.8 C) (Oral)   Ht 6\' 2"  (1.88 m)   Wt (!) 367 lb 3.2 oz (166.6 kg)   SpO2 94%   BMI 47.15 kg/m   BP/Weight 07/27/2019  06/23/2019 123456  Systolic BP Q000111Q 0000000 94  Diastolic BP 91 123456 57  Wt. (Lbs) 367.2 364 261  BMI 47.15 46.73 33.51      Physical Exam Constitutional:      Appearance: He is well-developed. He is obese.  Neck:     Vascular: No JVD.  Cardiovascular:     Rate and Rhythm: Normal rate.     Heart sounds: Normal heart sounds. No murmur.  Pulmonary:     Effort: Pulmonary effort is normal.     Breath sounds: Normal breath sounds. No wheezing or rales.  Chest:     Chest wall: No tenderness.  Abdominal:     General: Bowel sounds are normal. There is no distension.     Palpations: Abdomen is soft. There is no mass.     Tenderness: There is no abdominal tenderness.  Musculoskeletal:        General: Normal range of motion.     Right lower leg: Edema present.     Left lower leg: Edema present.     Comments: Tenderness on palpation of right thoracolumbar region and on twisting motion pain is reproducible  Neurological:     Mental Status: He is alert and oriented to person, place, and time.  Psychiatric:        Mood and Affect: Mood normal.     CMP Latest Ref Rng & Units 06/23/2019 01/31/2019 07/01/2017  Glucose 65 - 99 mg/dL 116(H) 179(H) 76  BUN 6 - 24 mg/dL 9 15 8   Creatinine 0.76 - 1.27 mg/dL 0.84 1.33(H) 0.84  Sodium 134 - 144 mmol/L 141 138 135  Potassium 3.5 - 5.2 mmol/L 4.3 3.6 4.3  Chloride 96 - 106 mmol/L 104 102 104  CO2 20 - 29 mmol/L 19(L) 23 20(L)  Calcium 8.7 - 10.2 mg/dL 9.7 9.3 8.9  Total Protein 6.0 - 8.5 g/dL 7.6 7.1 -  Total Bilirubin 0.0 - 1.2 mg/dL 0.4 0.5 -  Alkaline Phos 39 - 117 IU/L 55 50 -  AST 0 - 40 IU/L 53(H) 57(H) -  ALT 0 - 44 IU/L 73(H) 73(H) -    Lipid Panel  No results found for: CHOL, TRIG, HDL, CHOLHDL, VLDL, LDLCALC, LDLDIRECT  CBC    Component Value Date/Time   WBC 12.8 (H) 01/31/2019 0536   RBC 4.56 01/31/2019 0536   HGB 14.3 01/31/2019 0536   HGB 14.6 06/20/2014 1422   HCT 42.4 01/31/2019 0536   HCT 43.0 06/20/2014 1422   PLT  287 01/31/2019 0536   PLT 251 06/20/2014 1422   MCV 93.0 01/31/2019 0536   MCV 94 06/20/2014 1422   MCH 31.4 01/31/2019 0536   MCHC 33.7 01/31/2019 0536   RDW 12.4 01/31/2019 0536   RDW 12.9 06/20/2014 1422   LYMPHSABS 2.1 09/17/2016 1218   LYMPHSABS 2.0 06/20/2014 1422   MONOABS 1.6 (H) 09/17/2016 1218   MONOABS  0.7 06/20/2014 1422   EOSABS 0.0 09/17/2016 1218   EOSABS 0.2 06/20/2014 1422   BASOSABS 0.0 09/17/2016 1218   BASOSABS 0.1 06/20/2014 1422    Lab Results  Component Value Date   HGBA1C 6.5 06/23/2019    Assessment & Plan:   1. Type 2 diabetes mellitus without complication, without long-term current use of insulin ([place]) Newly diagnosed with A1c of 6.5 Continue Metformin Counseled on Diabetic diet, my plate method, [id number] minutes of moderate intensity exercise/week Blood sugar logs with fasting goals of 80-120 mg/dl, random of less than 180 and in the event of sugars less than 60 mg/dl or greater than 400 mg/dl encouraged to notify the clinic. Advised on the need for annual eye exams, annual foot exams, Pneumonia vaccine. - Glucose (CBG)  2. Carpal tunnel syndrome of left wrist Advised to obtain wrist brace Gabapentin dose increased especially given neuropathic symptoms occurring in thighs - gabapentin (NEURONTIN) 300 MG capsule; Take 2 capsules (600 mg total) by mouth 2 (two) times daily.  Dispense: 120 capsule; Refill: 3  3. Muscle spasm Weight loss will be beneficial We will initiate Robaxin-sedating side effects discussed Advised to apply heat - methocarbamol (ROBAXIN) 500 MG tablet; Take 1 tablet (500 mg total) by mouth every 8 (eight) hours as needed for muscle spasms.  Dispense: 60 tablet; Refill: 2  4. Renal calculi Urine is negative for hematuria His right-sided thoracolumbar pain is unlikely due to renal calculi - POCT URINALYSIS DIP (CLINITEK)  5. Pedal edema We will initiate Lasix-discussed side effects of Lasix Advised if current dose is  ineffective he can increase to 40 mg however he would need to notify me if he does We will check potassium level in 1 month Elevate feet, use compression stocking, reduce sodium intake - furosemide (LASIX) 20 MG tablet; Take 1 tablet (20 mg total) by mouth daily.  Dispense: 30 tablet; Refill: 3 - Basic Metabolic Panel; Future   Return in about 3 months (around 10/25/2019) for Chronic medical conditions.      Charlott Rakes, MD, FAAFP. [facility] [place], [place]   07/27/2019, 1:52 PM

## 2019-07-28 ENCOUNTER — Encounter: Payer: Self-pay | Admitting: Family Medicine

## 2019-08-27 ENCOUNTER — Ambulatory Visit: Payer: Self-pay | Attending: Family Medicine

## 2019-08-27 ENCOUNTER — Other Ambulatory Visit: Payer: Self-pay

## 2019-08-27 DIAGNOSIS — R600 Localized edema: Secondary | ICD-10-CM

## 2019-08-28 LAB — BASIC METABOLIC PANEL
BUN/Creatinine Ratio: 19 (ref 9–20)
BUN: 15 mg/dL (ref 6–24)
CO2: 21 mmol/L (ref 20–29)
Calcium: 10 mg/dL (ref 8.7–10.2)
Chloride: 102 mmol/L (ref 96–106)
Creatinine, Ser: 0.8 mg/dL (ref 0.76–1.27)
GFR calc Af Amer: 126 mL/min/{1.73_m2} (ref >59)
GFR calc non Af Amer: 109 mL/min/{1.73_m2} (ref >59)
Glucose: 197 mg/dL — ABNORMAL HIGH (ref 65–99)
Potassium: 4.2 mmol/L (ref 3.5–5.2)
Sodium: 138 mmol/L (ref 134–144)

## 2019-09-03 ENCOUNTER — Telehealth: Payer: Self-pay

## 2019-09-03 NOTE — Telephone Encounter (Signed)
-----   Message from Charlott Rakes, MD sent at 08/28/2019  6:01 PM EST ----- Please inform the patient that labs are normal. Thank you.

## 2019-09-03 NOTE — Telephone Encounter (Signed)
Patient was called at number on file and there is a busy signal.

## 2019-09-10 ENCOUNTER — Telehealth: Payer: Self-pay

## 2019-09-10 NOTE — Telephone Encounter (Signed)
-----   Message from Charlott Rakes, MD sent at 08/28/2019  6:01 PM EST ----- Please inform the patient that labs are normal. Thank you.

## 2019-09-10 NOTE — Telephone Encounter (Signed)
Patient was called and number has a busy signal.

## 2019-10-26 ENCOUNTER — Ambulatory Visit: Payer: Self-pay | Attending: Family Medicine | Admitting: Family Medicine

## 2019-10-26 ENCOUNTER — Encounter: Payer: Self-pay | Admitting: Family Medicine

## 2019-10-26 ENCOUNTER — Other Ambulatory Visit: Payer: Self-pay

## 2019-10-26 ENCOUNTER — Ambulatory Visit (HOSPITAL_BASED_OUTPATIENT_CLINIC_OR_DEPARTMENT_OTHER): Payer: Self-pay | Admitting: Pharmacist

## 2019-10-26 VITALS — BP 125/86 | HR 91 | Ht 74.0 in | Wt 332.0 lb

## 2019-10-26 DIAGNOSIS — M25561 Pain in right knee: Secondary | ICD-10-CM

## 2019-10-26 DIAGNOSIS — Z7189 Other specified counseling: Secondary | ICD-10-CM

## 2019-10-26 DIAGNOSIS — M25562 Pain in left knee: Secondary | ICD-10-CM

## 2019-10-26 DIAGNOSIS — G8929 Other chronic pain: Secondary | ICD-10-CM

## 2019-10-26 DIAGNOSIS — Z794 Long term (current) use of insulin: Secondary | ICD-10-CM

## 2019-10-26 DIAGNOSIS — E1165 Type 2 diabetes mellitus with hyperglycemia: Secondary | ICD-10-CM

## 2019-10-26 DIAGNOSIS — Z9114 Patient's other noncompliance with medication regimen: Secondary | ICD-10-CM

## 2019-10-26 DIAGNOSIS — I10 Essential (primary) hypertension: Secondary | ICD-10-CM

## 2019-10-26 LAB — POCT URINALYSIS DIP (CLINITEK)
Bilirubin, UA: NEGATIVE
Blood, UA: NEGATIVE
Glucose, UA: 500 mg/dL — AB
Leukocytes, UA: NEGATIVE
Nitrite, UA: NEGATIVE
POC PROTEIN,UA: NEGATIVE
Spec Grav, UA: 1.01 (ref 1.010–1.025)
Urobilinogen, UA: 0.2 E.U./dL
pH, UA: 5.5 (ref 5.0–8.0)

## 2019-10-26 LAB — POCT GLYCOSYLATED HEMOGLOBIN (HGB A1C): HbA1c, POC (controlled diabetic range): 12.8 % — AB (ref 0.0–7.0)

## 2019-10-26 LAB — GLUCOSE, POCT (MANUAL RESULT ENTRY)
POC Glucose: 507 mg/dl — AB (ref 70–99)
POC Glucose: 427 mg/dl — AB (ref 70–99)

## 2019-10-26 MED ORDER — LANTUS SOLOSTAR 100 UNIT/ML ~~LOC~~ SOPN: 10.0000 [IU] | PEN_INJECTOR | Freq: Every day | SUBCUTANEOUS | 3 refills | Status: DC

## 2019-10-26 MED ORDER — MELOXICAM 7.5 MG PO TABS: 7.5000 mg | ORAL_TABLET | Freq: Every day | ORAL | 3 refills | Status: DC

## 2019-10-26 MED ORDER — INSULIN ASPART 100 UNIT/ML ~~LOC~~ SOLN
20.0000 [IU] | Freq: Once | SUBCUTANEOUS | Status: AC
  Administered 2019-10-26: 20 [IU] via SUBCUTANEOUS

## 2019-10-26 MED ORDER — INSULIN PEN NEEDLE 31G X 5 MM MISC: 1.0000 | Freq: Every day | 5 refills | Status: AC

## 2019-10-26 MED ORDER — METFORMIN HCL 500 MG PO TABS: 500.0000 mg | ORAL_TABLET | Freq: Two times a day (BID) | ORAL | 3 refills | Status: DC

## 2019-10-26 MED ORDER — ATORVASTATIN CALCIUM 20 MG PO TABS: 20.0000 mg | ORAL_TABLET | Freq: Every day | ORAL | 3 refills | Status: DC

## 2019-10-26 MED FILL — ATORVASTATIN CALCIUM 20 MG: 20 | 30 days supply | Qty: 30 | Fill #0

## 2019-10-26 MED FILL — metFORMIN HCL 500 MG TABS: 500 | 30 days supply | Qty: 60 | Fill #0

## 2019-10-26 MED FILL — !LANTUS SOLOSTAR 100UNITS/M: 100 | 30 days supply | Qty: 3 | Fill #0

## 2019-10-26 MED FILL — TRUEPLUS 5-BEVEL PEN NEEDLE: 31G X 5 MM | 90 days supply | Qty: 100 | Fill #0

## 2019-10-26 MED FILL — MELOXICAM 7.5 MG TABLET: 7.5 | 30 days supply | Qty: 30 | Fill #0

## 2019-10-26 NOTE — Progress Notes (Signed)
Subjective:  Patient ID: Victor Hale, male    DOB: [birth year]  Age: 44 y.o. MRN: [id number]  CC: Diabetes   HPI Victor Hale is a 44 year old male with a history of hypertension, type 2 diabetes mellitus (A1c 12.8), obesity, chronic knee pain who presents today for follow-up visit  A1c is 12.8 up from 6.5 previously.  His blood sugar is 507 in the clinic and this is fasting.  He endorses polydipsia, polyuria. He stopped all his medications 3 months ago because they made him 'feel bad' which he describes as fidgeting. His R knee is swollen and has been painful. Pain is located on the superior aspect of his right patella, is 12/10; pain improved with walking and worse after prolonged sitting.  Of note he was placed on meloxicam at his last office visit which he is not taking. He had previously complained of pedal edema which he states has resolved at this time. Past Medical History:  Diagnosis Date  . Fatty liver     No past surgical history on file.  Family History  Problem Relation Age of Onset  . Hypertension Mother   . Diabetes Mother     Allergies  Allergen Reactions  . Penicillins Itching    Outpatient Medications Prior to Visit  Medication Sig Dispense Refill  . atorvastatin (LIPITOR) 20 MG tablet Take 1 tablet (20 mg total) by mouth daily. (Patient not taking: Reported on 10/26/2019) 30 tablet 3  . Blood Glucose Monitoring Suppl (TRUE METRIX METER) DEVI 1 each by Does not apply route daily before breakfast. (Patient not taking: Reported on 10/26/2019) 100 each 12  . furosemide (LASIX) 20 MG tablet Take 1 tablet (20 mg total) by mouth daily. (Patient not taking: Reported on 10/26/2019) 30 tablet 3  . gabapentin (NEURONTIN) 300 MG capsule Take 2 capsules (600 mg total) by mouth 2 (two) times daily. (Patient not taking: Reported on 10/26/2019) 120 capsule 3  . glucose blood (TRUE METRIX BLOOD GLUCOSE TEST) test strip Use daily before breakfast (Patient not taking: Reported on  10/26/2019) 100 each 12  . lidocaine (XYLOCAINE) 2 % jelly Apply topically.    Marland Kitchen lisinopril-hydrochlorothiazide (ZESTORETIC) 20-25 MG tablet Take 1 tablet by mouth daily. (Patient not taking: Reported on 10/26/2019) 30 tablet 3  . meloxicam (MOBIC) 7.5 MG tablet Take 1 tablet (7.5 mg total) by mouth daily. (Patient not taking: Reported on 10/26/2019) 30 tablet 1  . metFORMIN (GLUCOPHAGE) 500 MG tablet Take 1 tablet (500 mg total) by mouth 2 (two) times daily with a meal. (Patient not taking: Reported on 10/26/2019) 60 tablet 3  . methocarbamol (ROBAXIN) 500 MG tablet Take 1 tablet (500 mg total) by mouth every 8 (eight) hours as needed for muscle spasms. (Patient not taking: Reported on 10/26/2019) 60 tablet 2  . ondansetron (ZOFRAN ODT) 4 MG disintegrating tablet Take 1 tablet (4 mg total) by mouth every 8 (eight) hours as needed for nausea or vomiting. (Patient not taking: Reported on 10/26/2019) 8 tablet 0  . oxyCODONE-acetaminophen (PERCOCET/ROXICET) 5-325 MG tablet Take 1 tablet by mouth every 4 (four) hours as needed for severe pain. (Patient not taking: Reported on 06/23/2019) 15 tablet 0  . tamsulosin (FLOMAX) 0.4 MG CAPS capsule Take 1 capsule (0.4 mg total) by mouth daily. (Patient not taking: Reported on 06/23/2019) 7 capsule 0  . TRUEplus Lancets 28G MISC 1 each by Does not apply route 3 (three) times daily before meals. (Patient not taking: Reported on 10/26/2019) 100 each 12  No facility-administered medications prior to visit.     ROS Review of Systems  Constitutional: Negative for activity change and appetite change.  HENT: Negative for sinus pressure and sore throat.   Eyes: Negative for visual disturbance.  Respiratory: Negative for cough, chest tightness and shortness of breath.   Cardiovascular: Negative for chest pain and leg swelling.  Gastrointestinal: Negative for abdominal distention, abdominal pain, constipation and diarrhea.  Endocrine: Negative.   Genitourinary: Positive  for frequency. Negative for dysuria.  Musculoskeletal:       See HPI  Skin: Negative for rash.  Allergic/Immunologic: Negative.   Neurological: Negative for weakness, light-headedness and numbness.  Psychiatric/Behavioral: Negative for dysphoric mood and suicidal ideas.    Objective:  BP 125/86   Pulse 91   Ht 6\' 2"  (1.88 m)   Wt (!) 332 lb (150.6 kg)   SpO2 98%   BMI 42.63 kg/m   BP/Weight 10/26/2019 07/27/2019 123456  Systolic BP 0000000 Q000111Q 0000000  Diastolic BP 86 91 123456  Wt. (Lbs) 332 367.2 364  BMI 42.63 47.15 46.73      Physical Exam Constitutional:      Appearance: He is well-developed.  Neck:     Vascular: No JVD.  Cardiovascular:     Rate and Rhythm: Normal rate.     Heart sounds: Normal heart sounds. No murmur.  Pulmonary:     Effort: Pulmonary effort is normal.     Breath sounds: Normal breath sounds. No wheezing or rales.  Chest:     Chest wall: No tenderness.  Abdominal:     General: Bowel sounds are normal. There is no distension.     Palpations: Abdomen is soft. There is no mass.     Tenderness: There is no abdominal tenderness.  Musculoskeletal:     Right lower leg: No edema.     Left lower leg: No edema.     Comments: Normal appearance of knees Slight tenderness on range of motion and palpation of right knee  Neurological:     Mental Status: He is alert and oriented to person, place, and time.  Psychiatric:        Mood and Affect: Mood normal.     CMP Latest Ref Rng & Units 08/27/2019 06/23/2019 01/31/2019  Glucose 65 - 99 mg/dL 197(H) 116(H) 179(H)  BUN 6 - 24 mg/dL 15 9 15   Creatinine 0.76 - 1.27 mg/dL 0.80 0.84 1.33(H)  Sodium 134 - 144 mmol/L 138 141 138  Potassium 3.5 - 5.2 mmol/L 4.2 4.3 3.6  Chloride 96 - 106 mmol/L 102 104 102  CO2 20 - 29 mmol/L 21 19(L) 23  Calcium 8.7 - 10.2 mg/dL 10.0 9.7 9.3  Total Protein 6.0 - 8.5 g/dL - 7.6 7.1  Total Bilirubin 0.0 - 1.2 mg/dL - 0.4 0.5  Alkaline Phos 39 - 117 IU/L - 55 50  AST 0 - 40 IU/L  - 53(H) 57(H)  ALT 0 - 44 IU/L - 73(H) 73(H)    Lipid Panel  No results found for: CHOL, TRIG, HDL, CHOLHDL, VLDL, LDLCALC, LDLDIRECT  CBC    Component Value Date/Time   WBC 12.8 (H) 01/31/2019 0536   RBC 4.56 01/31/2019 0536   HGB 14.3 01/31/2019 0536   HGB 14.6 06/20/2014 1422   HCT 42.4 01/31/2019 0536   HCT 43.0 06/20/2014 1422   PLT 287 01/31/2019 0536   PLT 251 06/20/2014 1422   MCV 93.0 01/31/2019 0536   MCV 94 06/20/2014 1422   MCH 31.4 01/31/2019  0536   MCHC 33.7 01/31/2019 0536   RDW 12.4 01/31/2019 0536   RDW 12.9 06/20/2014 1422   LYMPHSABS 2.1 09/17/2016 1218   LYMPHSABS 2.0 06/20/2014 1422   MONOABS 1.6 (H) 09/17/2016 1218   MONOABS 0.7 06/20/2014 1422   EOSABS 0.0 09/17/2016 1218   EOSABS 0.2 06/20/2014 1422   BASOSABS 0.0 09/17/2016 1218   BASOSABS 0.1 06/20/2014 1422    Lab Results  Component Value Date   HGBA1C 12.8 (A) 10/26/2019     Assessment & Plan:   1. Type 2 diabetes mellitus with hyperglycemia, with long-term current use of insulin ([facility]) Uncontrolled due to noncompliance with A1c of 12.8 which has trended up from 6.5 previously Goal is less than 7 Hyperglycemia of 507 with ketonuria present and he is at risk of developing DKA No IV personnel in clinic; oral hydration administered, NovoLog 20 units administered stat Patient observed for 30 minutes and blood sugar repeated after Commenced on Lantus 10 units Clinical pharmacist called into provide education on administration of insulin He will follow up with the CPP regarding up titration of insulin until blood sugars are at goal Counseled on Diabetic diet, my plate method, [id number] minutes of moderate intensity exercise/week Blood sugar logs with fasting goals of 80-120 mg/dl, random of less than 180 and in the event of sugars less than 60 mg/dl or greater than 400 mg/dl encouraged to notify the clinic. Advised on the need for annual eye exams, annual foot exams, Pneumonia vaccine. - POCT  glucose (manual entry) - POCT glycosylated hemoglobin (Hb A1C) - POCT URINALYSIS DIP (CLINITEK) - Basic Metabolic Panel - atorvastatin (LIPITOR) 20 MG tablet; Take 1 tablet (20 mg total) by mouth daily.  Dispense: 30 tablet; Refill: 3 - insulin glargine (LANTUS SOLOSTAR) 100 UNIT/ML Solostar Pen; Inject 10 Units into the skin daily.  Dispense: 5 pen; Refill: 3 - metFORMIN (GLUCOPHAGE) 500 MG tablet; Take 1 tablet (500 mg total) by mouth 2 (two) times daily with a meal.  Dispense: 60 tablet; Refill: 3 - Insulin Pen Needle 31G X 5 MM MISC; 1 each by Does not apply route at bedtime.  Dispense: 30 each; Refill: 5 - insulin aspart (novoLOG) injection 20 Units - POCT glucose (manual entry) - Lipid panel  2. Chronic pain of both knees Likely underlying early osteoarthritis Weight loss will be beneficial Advised to obtain knee brace Placed on NSAID If unrelieved consider PT - meloxicam (MOBIC) 7.5 MG tablet; Take 1 tablet (7.5 mg total) by mouth daily.  Dispense: 30 tablet; Refill: 3  3. Essential hypertension Blood pressure is controlled despite not taking antihypertensive We will hold off on resuming lisinopril/HCTZ and continue dietary modifications Counseled on blood pressure goal of less than 130/80, low-sodium, DASH diet, medication compliance, 150 minutes of moderate intensity exercise per week. Discussed medication compliance, adverse effects.   4. Non compliance w medication regimen Discussed implications and complication of nonadherence to medication regimen He promises to resume his medications.   Charlott Rakes, MD, FAAFP. [facility] [place], [place]   10/26/2019, 9:16 AM

## 2019-10-26 NOTE — Progress Notes (Signed)
Patient was educated on the use of the Lantus pen. Reviewed necessary supplies and operation of the pen. Also reviewed goal blood glucose levels. Patient was able to demonstrate use. All questions and concerns were addressed.  

## 2019-10-26 NOTE — Progress Notes (Signed)
Right knee has been swelling Frequent urination, dry mouth. States that he has stopped all medications die to making him feel bad.

## 2019-10-27 LAB — BASIC METABOLIC PANEL
BUN/Creatinine Ratio: 11 (ref 9–20)
BUN: 11 mg/dL (ref 6–24)
CO2: 10 mmol/L — ABNORMAL LOW (ref 20–29)
Calcium: 9.3 mg/dL (ref 8.7–10.2)
Chloride: 103 mmol/L (ref 96–106)
Creatinine, Ser: 0.97 mg/dL (ref 0.76–1.27)
GFR calc Af Amer: 110 mL/min/{1.73_m2} (ref >59)
GFR calc non Af Amer: 95 mL/min/{1.73_m2} (ref >59)
Glucose: 472 mg/dL — ABNORMAL HIGH (ref 65–99)
Potassium: 4.7 mmol/L (ref 3.5–5.2)
Sodium: 139 mmol/L (ref 134–144)

## 2019-10-29 ENCOUNTER — Telehealth: Payer: Self-pay

## 2019-10-29 NOTE — Telephone Encounter (Signed)
-----   Message from Charlott Rakes, MD sent at 10/27/2019  6:00 PM EDT ----- Kidney and liver functions are normal but glucose is elevated.  Please advise to comply with a new diabetic regimen.

## 2019-10-29 NOTE — Telephone Encounter (Signed)
Call was placed to go over lab results and operator states that call can not be completed at this time.

## 2019-11-02 ENCOUNTER — Ambulatory Visit: Payer: Self-pay | Attending: Family Medicine | Admitting: Pharmacist

## 2019-11-02 ENCOUNTER — Other Ambulatory Visit: Payer: Self-pay

## 2019-11-02 DIAGNOSIS — Z794 Long term (current) use of insulin: Secondary | ICD-10-CM

## 2019-11-02 DIAGNOSIS — E1165 Type 2 diabetes mellitus with hyperglycemia: Secondary | ICD-10-CM

## 2019-11-02 LAB — GLUCOSE, POCT (MANUAL RESULT ENTRY): POC Glucose: 381 mg/dl — AB (ref 70–99)

## 2019-11-02 NOTE — Patient Instructions (Signed)
Thank you for coming to see me today. Please do the following:  1. Take 20 units of your insulin every morning.  2. Start taking 2 tablets of metformin in the morning AFTER your breakfast.  3. Start taking 2 tablets of metformin in the evening AFTER your dinner. 4. Start checking blood sugars at home in the morning and occasionally after you eat. 5. Continue making the lifestyle changes we've discussed together during our visit. See your handout.  6. Follow-up with me in 2 weeks.   Hypoglycemia or low blood sugar:   Low blood sugar can happen quickly and may become an emergency if not treated right away.   While this shouldn't happen often, it can be brought upon if you skip a meal or do not eat enough. Also, if your insulin or other diabetes medications are dosed too high, this can cause your blood sugar to go to low.   Warning signs of low blood sugar include: 1. Feeling shaky or dizzy 2. Feeling weak or tired  3. Excessive hunger 4. Feeling anxious or upset  5. Sweating even when you aren't exercising  What to do if I experience low blood sugar? 1. Check your blood sugar with your meter. If lower than 70, proceed to step 2.  2. Treat with 3-4 glucose tablets or 3 packets of regular sugar. If these aren't around, you can try hard candy. Yet another option would be to drink 4 ounces of fruit juice or 6 ounces of REGULAR soda.  3. Re-check your sugar in 15 minutes. If it is still below 70, do what you did in step 2 again. If has come back up, go ahead and eat a snack or small meal at this time.

## 2019-11-02 NOTE — Progress Notes (Signed)
    S:     PCP: Dr. Margarita Rana  No chief complaint on file.  Patient arrives in good spirits.  Presents for diabetes evaluation, education, and management Patient was referred and last seen by Primary Care Provider on 10/26/19 and initiated on Lantus 10 units daily.   Patient reports Diabetes was diagnosed in 06/2019.   Family/Social History: -Fhx: HTN and DM (mother) -Tobacco use: never smoker  Human resources officer affordability: Self-pay  Patient denies adherence with medications, hasn't taken insulin since Thursday/Friday due to forgetting to take insulin in the evening because late shift at work. Patient reports infrequent nausea with metformin. Patient reports eating an orange this morning.   Current diabetes medications include: Lantus 10 units daily (missed last two days), Metformin 500 mg BID  Patient denies hypoglycemic events.  Patient reported dietary habits: salads, vegetables, grilled chicken, bologna sandwich, sodas  Patient-reported exercise habits: will start exercising 45 mins Mon-Friday at the gym   Patient reports nocturia (nighttime urination).  Patient reports neuropathy (nerve pain). Patient reports visual changes.   O:   POCT: 381 (after breakfast)  Home fasting blood sugars: 583, 589, 289, 300s, 400s 2 hour post-meal/random blood sugars: not checking  Lab Results  Component Value Date   HGBA1C 12.8 (A) 10/26/2019   There were no vitals filed for this visit.  Lipid Panel  No results found for: CHOL, TRIG, HDL, CHOLHDL, VLDL, LDLCALC, LDLDIRECT  Clinical Atherosclerotic Cardiovascular Disease (ASCVD): No  The ASCVD Risk score Mikey Bussing DC Jr., et al., 2013) failed to calculate for the following reasons:   Cannot find a previous HDL lab   Cannot find a previous total cholesterol lab    A/P: Diabetes longstanding currently uncontrolled. Patient is able to verbalize appropriate hypoglycemia management plan. Patient is not adherent with  medication. Instructed patient to take Lantus in the morning to increase adherence. -Increase Lantus to 20 units every morning -Increase Metformin to 1000 mg BID -Check lipid panel at next visit -Extensively discussed pathophysiology of diabetes, recommended lifestyle interventions, dietary effects on blood sugar control -Counseled on s/sx of and management of hypoglycemia -Next A1C anticipated 01/2020.   Written patient instructions provided.  Total time in face to face counseling 25 minutes.   Follow up Pharmacist Clinic Visit in 2 weeks.     Patient seen with:  Lorel Monaco, PharmD PGY1 Ambulatory Care Resident [place], PharmD, [place] [phone]

## 2019-11-06 IMAGING — US US RENAL
1 series · 14 of 25 positions shown · non-contrast
Comparison: 09/11/2016 CT

CLINICAL DATA: Right greater than left flank pain, chronic

EXAM:
RENAL / URINARY TRACT ULTRASOUND COMPLETE

[Series 1: us renal · 0.34mm/px · 14 of 37 slices shown]
[im 1/37]
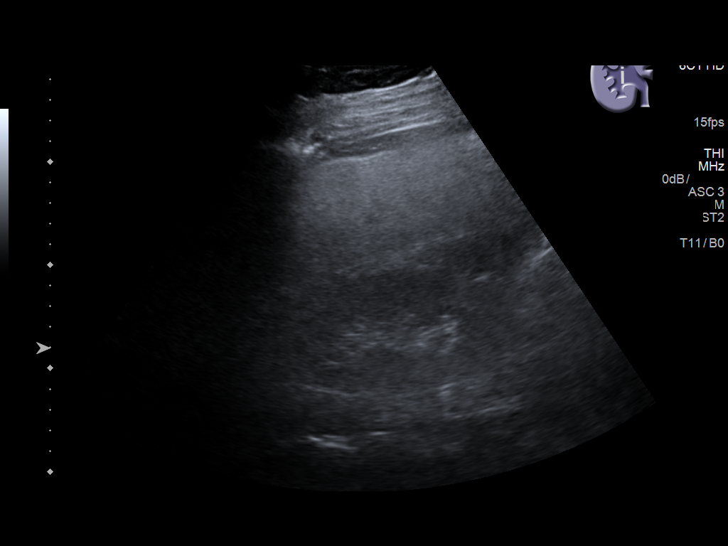
[im 4/37]
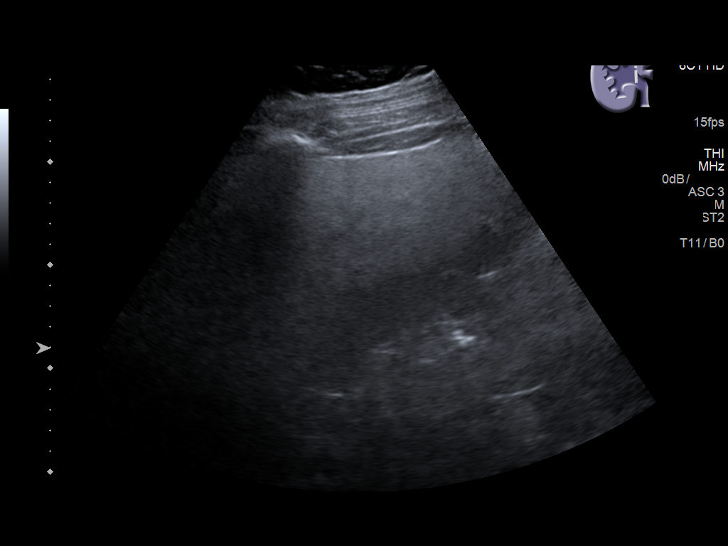
[im 7/37]
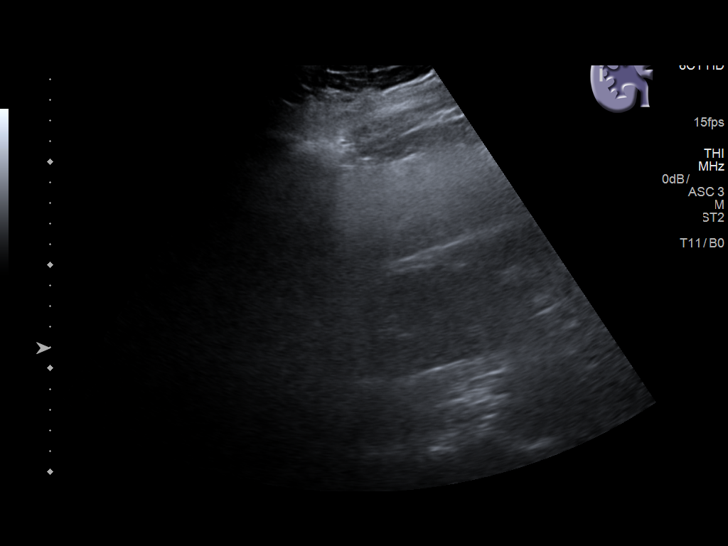
[im 10/37]
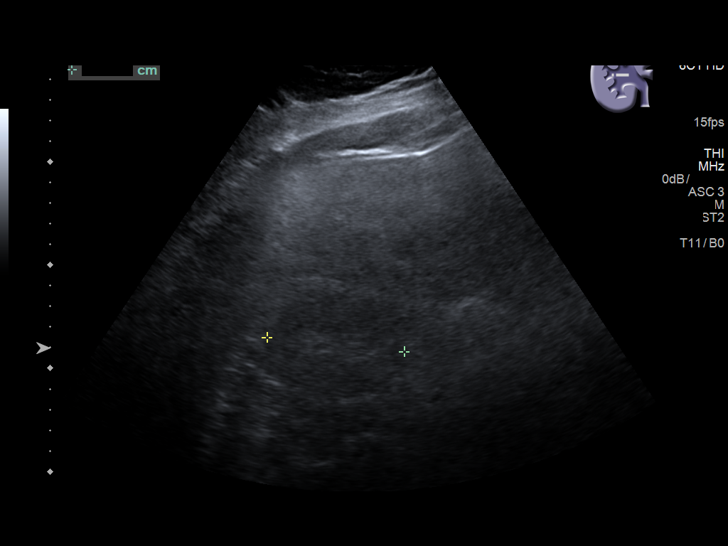
[im 13/37]
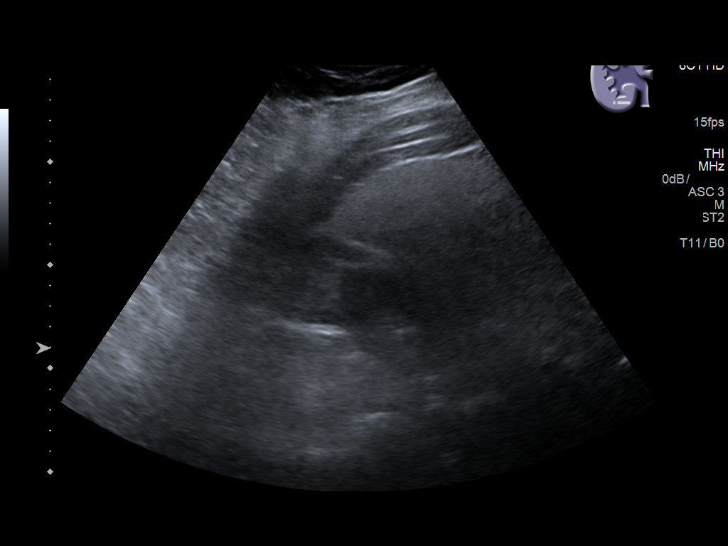
[im 14/37]
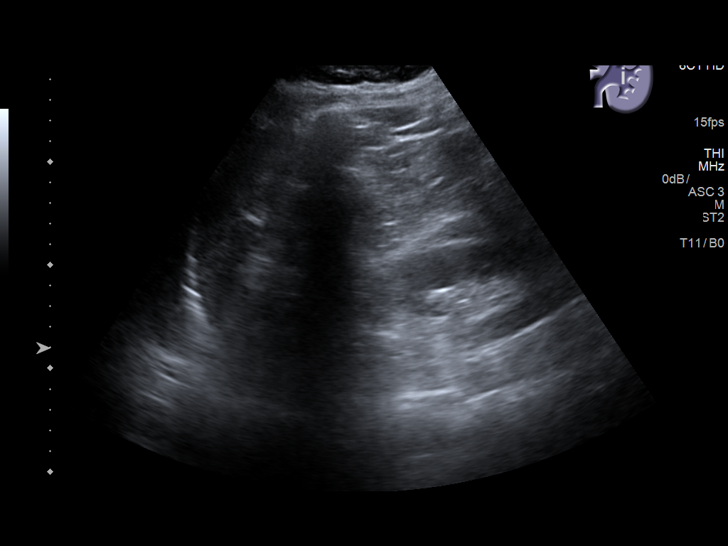
[im 17/37]
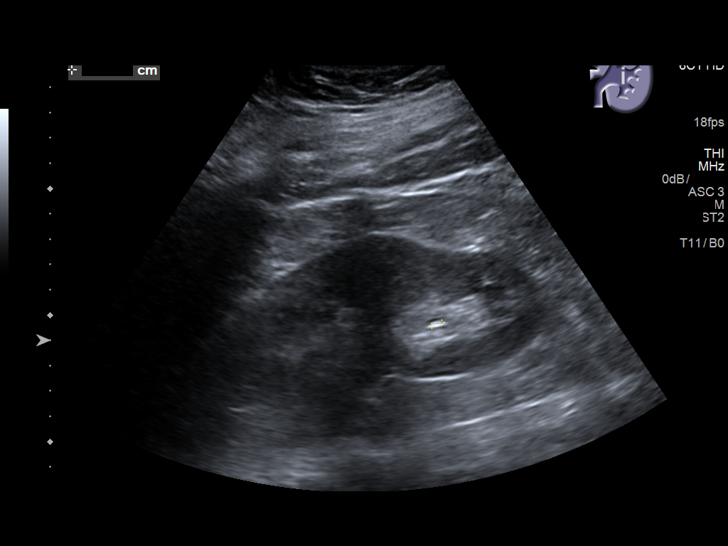
[im 20/37]
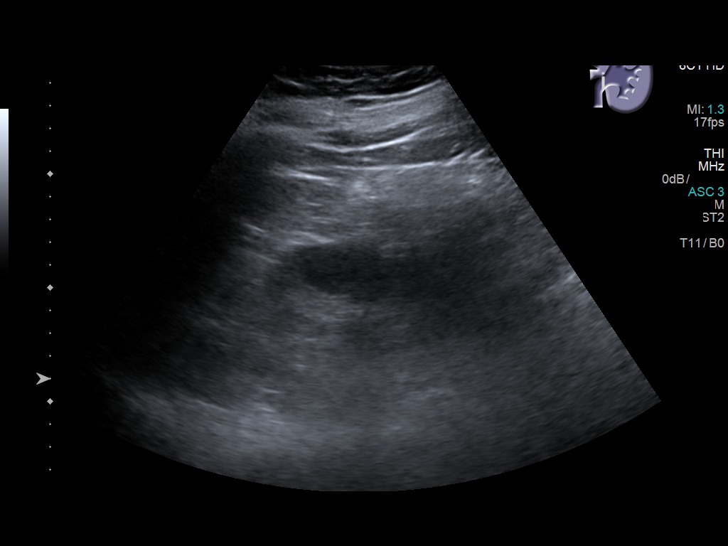
[im 23/37]
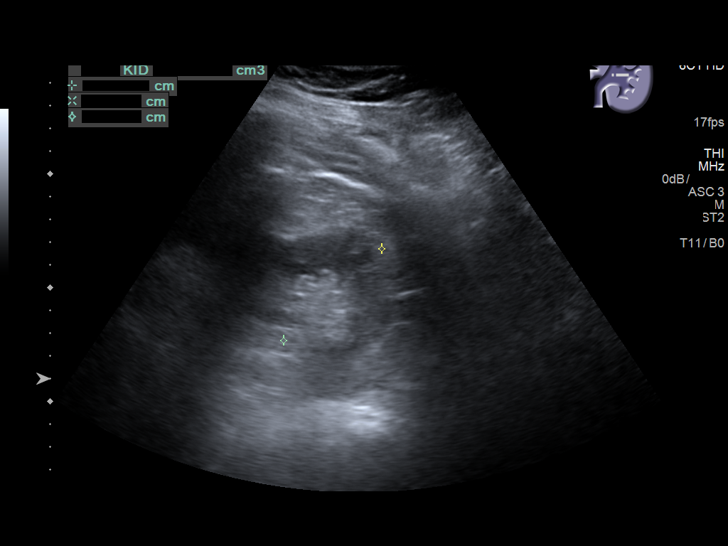
[im 25/37]
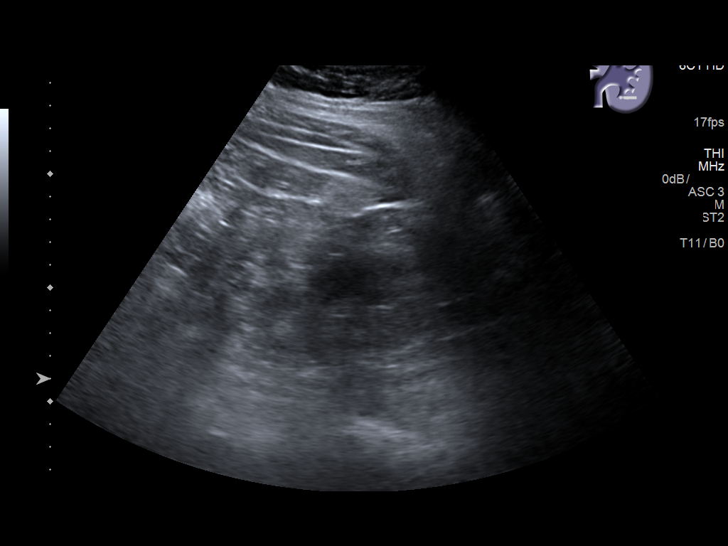
[im 28/37]
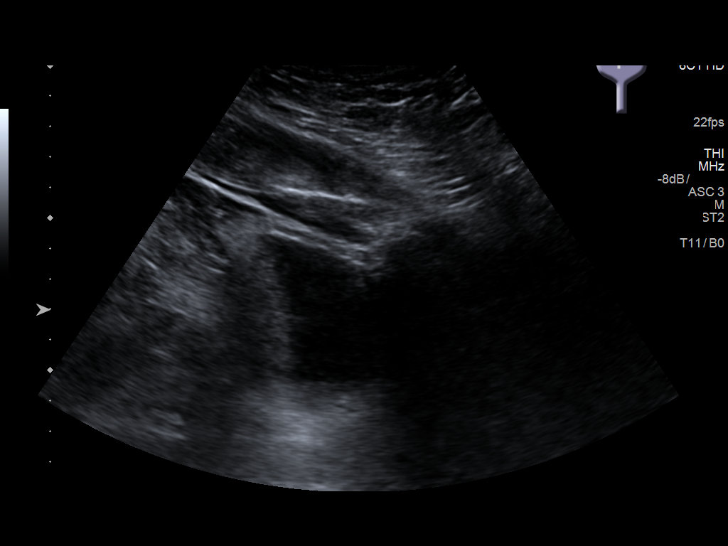
[im 31/37]
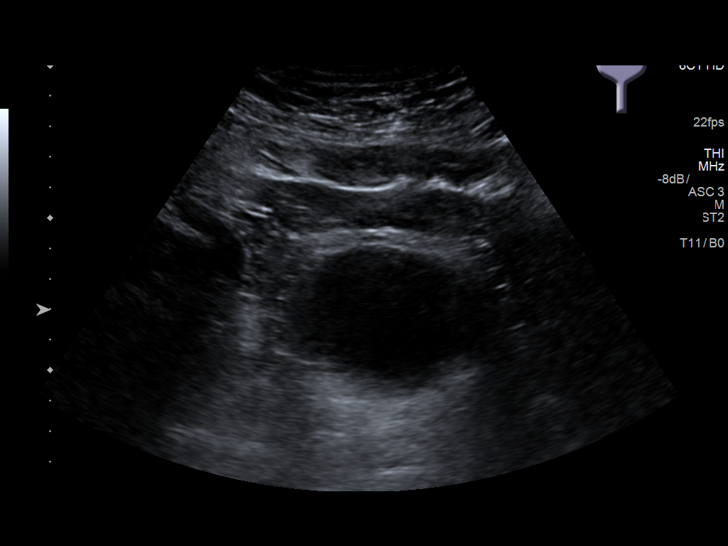
[im 34/37]
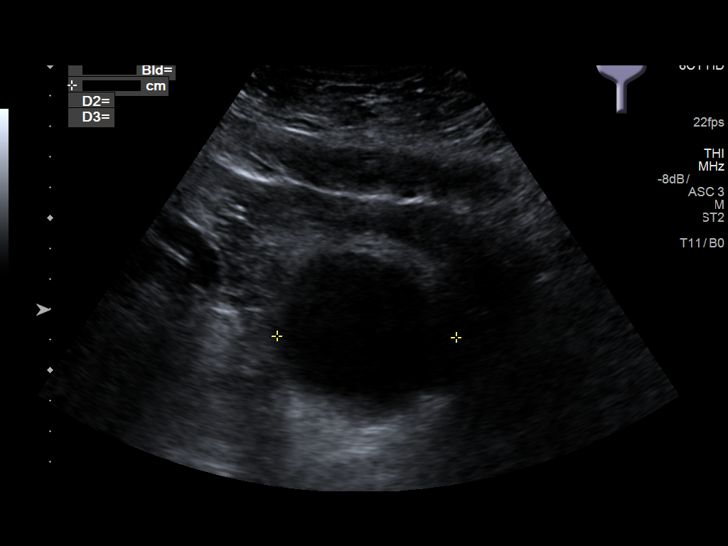
[im 37/37]
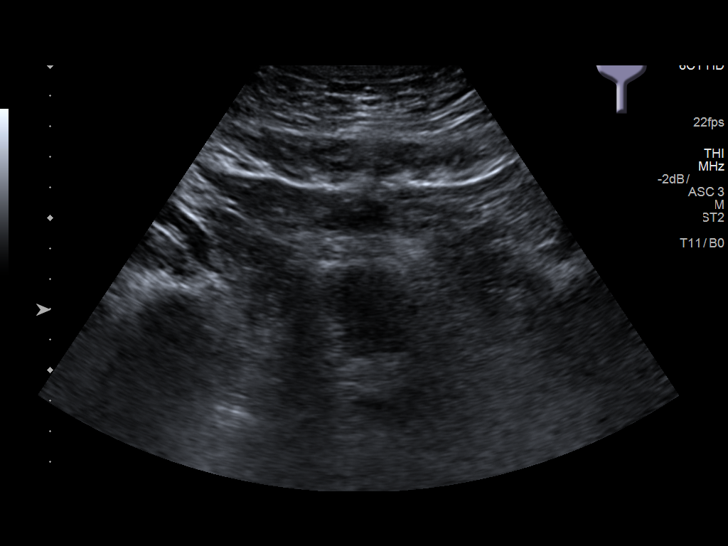

[14 of 25 positions shown; findings below may reference images not displayed]

FINDINGS: Right Kidney:

Renal measurements: 12.1 x 5.9 x 6.7 cm = volume: 250 mL. Echogenic
1 cm focus in the lower pole, likely nonobstructing stone. No
hydronephrosis. Normal echotexture.

Left Kidney:

Renal measurements: 13.0 x 5.9 x 5.9 cm = volume: 237 mL. Multiple
echogenic foci, the largest 7 mm, likely nonobstructing stones. No
hydronephrosis. Normal echotexture.

Bladder:

Appears normal for degree of bladder distention.

Incidentally noted is fatty infiltration of the liver.
IMPRESSION: Suspect small bilateral nonobstructing renal stones. No acute
findings. No hydronephrosis.

## 2019-11-16 ENCOUNTER — Other Ambulatory Visit: Payer: Self-pay

## 2019-11-16 ENCOUNTER — Ambulatory Visit: Payer: Self-pay | Attending: Family Medicine | Admitting: Pharmacist

## 2019-11-16 DIAGNOSIS — E1165 Type 2 diabetes mellitus with hyperglycemia: Secondary | ICD-10-CM

## 2019-11-16 DIAGNOSIS — Z794 Long term (current) use of insulin: Secondary | ICD-10-CM

## 2019-11-16 MED ORDER — METFORMIN HCL 500 MG PO TABS: 1000.0000 mg | ORAL_TABLET | Freq: Two times a day (BID) | ORAL | 2 refills | Status: DC

## 2019-11-16 MED ORDER — LANTUS SOLOSTAR 100 UNIT/ML ~~LOC~~ SOPN: 30.0000 [IU] | PEN_INJECTOR | Freq: Every day | SUBCUTANEOUS | 3 refills | Status: DC

## 2019-11-16 MED FILL — !LANTUS SOLOSTAR 100UNITS/M: 100 | 26 days supply | Qty: 12 | Fill #0

## 2019-11-16 MED FILL — metFORMIN HCL 500 MG TABS: 500 | 30 days supply | Qty: 120 | Fill #0

## 2019-11-16 NOTE — Progress Notes (Signed)
    S:     PCP: Dr. Margarita Rana  No chief complaint on file.  Patient arrives in good spirits.  Presents for diabetes evaluation, education, and management Patient was referred and last seen by Primary Care Provider on 10/26/19 and initiated on Lantus 10 units daily. We saw him 11/02/19 and increased Lantus to 20 units daily. Metformin dose was increased 1000 mg BID.   Patient reports Diabetes was diagnosed in 06/2019.   Family/Social History: -Fhx: HTN and DM (mother) -Tobacco use: never smoker  Human resources officer affordability: Self-pay  Patient denies adherence with medications, and hasn't taken insulin since Saturday due to running out of Lantus. Patient reports infrequent diarrhea with metformin. Current diabetes medications include: Lantus 20 units daily (missed last two days), Metformin 1000 mg BID  Patient denies hypoglycemic events.  Patient reported dietary habits: salads, vegetables, grilled chicken, bologna sandwich, sodas, jasmine rice  Patient-reported exercise habits:  - Lifts at [employer] daily - Reports walking daily but does not give a specific amount of time   Patient reports nocturia (nighttime urination).  Patient reports neuropathy (nerve pain). Patient reports visual changes.   O:  Home fasting blood sugars: reports mostly 200-400s 2 hour post-meal/random blood sugars: not checking  Lab Results  Component Value Date   HGBA1C 12.8 (A) 10/26/2019   There were no vitals filed for this visit.  Lipid Panel  No results found for: CHOL, TRIG, HDL, CHOLHDL, VLDL, LDLCALC, LDLDIRECT  Clinical Atherosclerotic Cardiovascular Disease (ASCVD): No  The ASCVD Risk score Mikey Bussing DC Jr., et al., 2013) failed to calculate for the following reasons:   Cannot find a previous HDL lab   Cannot find a previous total cholesterol lab    A/P: Diabetes longstanding currently uncontrolled. Patient is able to verbalize appropriate hypoglycemia management plan.  Patient is not adherent with medication; will refill Lantus and increase dose. We have discussed the importance of medication adherence.  -Increase Lantus to 30 units every morning. -Instructions given to self-titrate every 3 days by 2 units to achieve goal fasting CBG of 130 or lower. Max 45 units.  -Continue Metformin to 1000 mg BID -Check lipid panel at next visit -Extensively discussed pathophysiology of diabetes, recommended lifestyle interventions, dietary effects on blood sugar control -Counseled on s/sx of and management of hypoglycemia -Next A1C anticipated 01/2020.   Written patient instructions provided.  Total time in face to face counseling 25 minutes.   Follow up Pharmacist Clinic Visit in 3 weeks.     Victor Hale, PharmD, [place] [phone]

## 2019-11-17 ENCOUNTER — Encounter: Payer: Self-pay | Admitting: Pharmacist

## 2019-11-17 NOTE — Patient Instructions (Signed)
Thank you for coming to see me today. Please do the following:  1. Increase your insulin dose to 30 units every morning. You may increase by 2 units every 3 days if home fasting sugars continue to be above 130. 2. Continue checking blood sugars at home. It's really important that you record these and bring these in to your next doctor's appointment. If you get in readings above 500 or lower than 70, call me or the clinic to let your doctor know. See below on how to treat low blood sugar.  3. Continue making the lifestyle changes we've discussed together during our visit. Diet and exercise play a significant role in improving your blood sugars.  4. Follow-up with me and your PCP as planned.    Hypoglycemia or low blood sugar:   Low blood sugar can happen quickly and may become an emergency if not treated right away.   While this shouldn't happen often, it can be brought upon if you skip a meal or do not eat enough. Also, if your insulin or other diabetes medications are dosed too high, this can cause your blood sugar to go to low.   Warning signs of low blood sugar include: 1. Feeling shaky or dizzy 2. Feeling weak or tired  3. Excessive hunger 4. Feeling anxious or upset  5. Sweating even when you aren't exercising  What to do if I experience low blood sugar? 1. Check your blood sugar with your meter. If lower than 70, proceed to step 2.  2. Treat with 3-4 glucose tablets or 3 packets of regular sugar. If these aren't around, you can try hard candy. Yet another option would be to drink 4 ounces of fruit juice or 6 ounces of REGULAR soda.  3. Re-check your sugar in 15 minutes. If it is still below 70, do what you did in step 2 again. If has come back up, go ahead and eat a snack or small meal at this time.

## 2019-11-27 MED FILL — MELOXICAM 7.5 MG TABLET: 7.5 | 30 days supply | Qty: 30 | Fill #1

## 2019-11-27 MED FILL — ATORVASTATIN CALCIUM 20 MG: 20 | 30 days supply | Qty: 30 | Fill #1

## 2019-12-02 ENCOUNTER — Ambulatory Visit: Payer: Self-pay | Attending: Family Medicine | Admitting: Family Medicine

## 2019-12-02 ENCOUNTER — Encounter: Payer: Self-pay | Admitting: Family Medicine

## 2019-12-02 ENCOUNTER — Other Ambulatory Visit: Payer: Self-pay

## 2019-12-02 VITALS — BP 120/80 | HR 82 | Ht 74.0 in | Wt 335.0 lb

## 2019-12-02 DIAGNOSIS — M25561 Pain in right knee: Secondary | ICD-10-CM

## 2019-12-02 DIAGNOSIS — E1165 Type 2 diabetes mellitus with hyperglycemia: Secondary | ICD-10-CM

## 2019-12-02 DIAGNOSIS — H539 Unspecified visual disturbance: Secondary | ICD-10-CM

## 2019-12-02 DIAGNOSIS — N528 Other male erectile dysfunction: Secondary | ICD-10-CM

## 2019-12-02 DIAGNOSIS — Z794 Long term (current) use of insulin: Secondary | ICD-10-CM

## 2019-12-02 DIAGNOSIS — G8929 Other chronic pain: Secondary | ICD-10-CM

## 2019-12-02 LAB — GLUCOSE, POCT (MANUAL RESULT ENTRY): POC Glucose: 149 mg/dl — AB (ref 70–99)

## 2019-12-02 MED ORDER — SILDENAFIL CITRATE 50 MG PO TABS
50.0000 mg | ORAL_TABLET | Freq: Every day | ORAL | 1 refills | Status: DC | PRN
Start: 2019-12-02 — End: 2020-06-02

## 2019-12-02 MED FILL — SILDENAFIL CITRATE 50 MG TA: 50 | 30 days supply | Qty: 10 | Fill #0

## 2019-12-02 NOTE — Progress Notes (Signed)
Subjective:  Patient ID: Victor Hale, male    DOB: 1975/08/23  Age: 44 y.o. MRN: [id number]  CC: Diabetes and Hypertension   HPI Victor Hale  is a 44 year old male with a history of hypertension, type 2 diabetes mellitus (A1c 12.8), obesity, chronic knee pain who presents today for follow-up visit. Since his last visit with me he has been following up with the clinical pharmacist and his Lantus dose has been uptitrated every third day. Fasting sugars have been around 130 and he is on 38 units of Lantus. He has been going to the gym regularly and working on eating healthy.  His vision has worsened though and this is evident when he is driving and he sometimes is unable to see his exit; yet to have a diabetic eye exam. His R knee buckles; it has not been swelling as it was prior to his last visit when he was placed on meloxicam for pain in both knees.  His left knee is better now. He does have posterior right knee pain There is no history of underlying trauma He uses a knee brace when he goes to sleep He is currently on diet control for his hypertension as his blood pressure has been controlled without his antihypertensive. He complains of erectile dysfunction which he attributes to his medications.  Past Medical History:  Diagnosis Date  . Fatty liver     No past surgical history on file.  Family History  Problem Relation Age of Onset  . Hypertension Mother   . Diabetes Mother     Allergies  Allergen Reactions  . Penicillins Itching    Outpatient Medications Prior to Visit  Medication Sig Dispense Refill  . atorvastatin (LIPITOR) 20 MG tablet Take 1 tablet (20 mg total) by mouth daily. 30 tablet 3  . Blood Glucose Monitoring Suppl (TRUE METRIX METER) DEVI 1 each by Does not apply route daily before breakfast. 100 each 12  . glucose blood (TRUE METRIX BLOOD GLUCOSE TEST) test strip Use daily before breakfast 100 each 12  . insulin glargine (LANTUS SOLOSTAR) 100 UNIT/ML  Solostar Pen Inject 30 Units into the skin daily. Patient may increase by 2 units every 3 days until fasting sugar is < 130. Max 45 units. 5 pen 3  . Insulin Pen Needle 31G X 5 MM MISC 1 each by Does not apply route at bedtime. 30 each 5  . lidocaine (XYLOCAINE) 2 % jelly Apply topically.    Marland Kitchen lisinopril-hydrochlorothiazide (ZESTORETIC) 20-25 MG tablet Take 1 tablet by mouth daily. 30 tablet 3  . meloxicam (MOBIC) 7.5 MG tablet Take 1 tablet (7.5 mg total) by mouth daily. 30 tablet 3  . metFORMIN (GLUCOPHAGE) 500 MG tablet Take 2 tablets (1,000 mg total) by mouth 2 (two) times daily with a meal. 120 tablet 2  . TRUEplus Lancets 28G MISC 1 each by Does not apply route 3 (three) times daily before meals. 100 each 12  . methocarbamol (ROBAXIN) 500 MG tablet Take 1 tablet (500 mg total) by mouth every 8 (eight) hours as needed for muscle spasms. (Patient not taking: Reported on 10/26/2019) 60 tablet 2   No facility-administered medications prior to visit.     ROS Review of Systems  Constitutional: Negative for activity change and appetite change.  HENT: Negative for sinus pressure and sore throat.   Eyes: Negative for visual disturbance.  Respiratory: Negative for cough, chest tightness and shortness of breath.   Cardiovascular: Negative for chest pain and leg swelling.  Gastrointestinal: Negative for abdominal distention, abdominal pain, constipation and diarrhea.  Endocrine: Negative.   Genitourinary: Negative for dysuria.  Musculoskeletal:       See HPI  Skin: Negative for rash.  Allergic/Immunologic: Negative.   Neurological: Negative for weakness, light-headedness and numbness.  Psychiatric/Behavioral: Negative for dysphoric mood and suicidal ideas.    Objective:  BP 120/80   Pulse 82   Ht 6\' 2"  (1.88 m)   Wt (!) 335 lb (152 kg)   SpO2 97%   BMI 43.01 kg/m   BP/Weight 12/02/2019 10/26/2019 [id number]  Systolic BP 123456 0000000 [id number]  Diastolic BP 80 86 91  Wt. (Lbs) 335 332 367.2    BMI 43.01 42.63 47.15      Physical Exam Constitutional:      Appearance: He is well-developed.  Neck:     Vascular: No JVD.  Cardiovascular:     Rate and Rhythm: Normal rate.     Heart sounds: Normal heart sounds. No murmur.  Pulmonary:     Effort: Pulmonary effort is normal.     Breath sounds: Normal breath sounds. No wheezing or rales.  Chest:     Chest wall: No tenderness.  Abdominal:     General: Bowel sounds are normal. There is no distension.     Palpations: Abdomen is soft. There is no mass.     Tenderness: There is no abdominal tenderness.  Musculoskeletal:        General: Normal range of motion.     Right lower leg: No edema.     Left lower leg: No edema.     Comments: Slight right knee edema Tenderness to palpation of right popliteal fossa and on flexion and extension Left knee is normal  Neurological:     Mental Status: He is alert and oriented to person, place, and time.  Psychiatric:        Mood and Affect: Mood normal.     CMP Latest Ref Rng & Units 10/26/2019 08/27/2019 06/23/2019  Glucose 65 - 99 mg/dL 472(H) 197(H) 116(H)  BUN 6 - 24 mg/dL 11 15 9   Creatinine 0.76 - 1.27 mg/dL 0.97 0.80 0.84  Sodium 134 - 144 mmol/L 139 138 141  Potassium 3.5 - 5.2 mmol/L 4.7 4.2 4.3  Chloride 96 - 106 mmol/L 103 102 104  CO2 20 - 29 mmol/L 10(L) 21 19(L)  Calcium 8.7 - 10.2 mg/dL 9.3 10.0 9.7  Total Protein 6.0 - 8.5 g/dL - - 7.6  Total Bilirubin 0.0 - 1.2 mg/dL - - 0.4  Alkaline Phos 39 - 117 IU/L - - 55  AST 0 - 40 IU/L - - 53(H)  ALT 0 - 44 IU/L - - 73(H)    Lipid Panel  No results found for: CHOL, TRIG, HDL, CHOLHDL, VLDL, LDLCALC, LDLDIRECT  CBC    Component Value Date/Time   WBC 12.8 (H) 01/31/2019 0536   RBC 4.56 01/31/2019 0536   HGB 14.3 01/31/2019 0536   HGB 14.6 06/20/2014 1422   HCT 42.4 01/31/2019 0536   HCT 43.0 06/20/2014 1422   PLT 287 01/31/2019 0536   PLT 251 06/20/2014 1422   MCV 93.0 01/31/2019 0536   MCV 94 06/20/2014 1422    MCH 31.4 01/31/2019 0536   MCHC 33.7 01/31/2019 0536   RDW 12.4 01/31/2019 0536   RDW 12.9 06/20/2014 1422   LYMPHSABS 2.1 09/17/2016 1218   LYMPHSABS 2.0 06/20/2014 1422   MONOABS 1.6 (H) 09/17/2016 1218   MONOABS 0.7 06/20/2014 1422  EOSABS 0.0 09/17/2016 1218   EOSABS 0.2 06/20/2014 1422   BASOSABS 0.0 09/17/2016 1218   BASOSABS 0.1 06/20/2014 1422    Lab Results  Component Value Date   HGBA1C 12.8 (A) 10/26/2019    Assessment & Plan:  1. Type 2 diabetes mellitus with hyperglycemia, with long-term current use of insulin (HCC) Uncontrolled with A1c of 12.8; goal is less than 7 Blood sugar log reveals improvement -he has been commended on this Continue 38 units of Lantus Counseled on Diabetic diet, my plate method, [id number] minutes of moderate intensity exercise/week Blood sugar logs with fasting goals of 80-120 mg/dl, random of less than 180 and in the event of sugars less than 60 mg/dl or greater than 400 mg/dl encouraged to notify the clinic. Advised on the need for annual eye exams, annual foot exams, Pneumonia vaccine. - POCT glucose (manual entry) - Lipid panel  2. Other male erectile dysfunction Multifactorial-combination of age, medical conditions and medications Placed on Viagra  3. Chronic pain of right knee Commended on weight loss; additional weight loss will be beneficial Continue meloxicam - DG Knee Complete 4 Views Right; Future - Ambulatory referral to Physical Therapy  4. Vision abnormalities This could be secondary to his lenses adjusting to normoglycemia from previous hyperglycemia Encouraged to set up an appointment with the ophthalmologist-discussed community resources especially since he has no medical coverage.   Return in about 3 months (around 03/02/2020) for Chronic disease management.    Charlott Rakes, MD, FAAFP. [facility] and [place], [place]   12/02/2019, 9:00 AM

## 2019-12-02 NOTE — Progress Notes (Signed)
States that right knee gives out on him.  States that his vision is getting worse.  States that he is having problems with ED

## 2019-12-02 NOTE — Patient Instructions (Signed)

## 2019-12-03 LAB — LIPID PANEL
Chol/HDL Ratio: 3 ratio (ref 0.0–5.0)
Cholesterol, Total: 152 mg/dL (ref 100–199)
HDL: 50 mg/dL (ref >39)
LDL Chol Calc (NIH): 83 mg/dL (ref 0–99)
Triglycerides: 103 mg/dL (ref 0–149)
VLDL Cholesterol Cal: 19 mg/dL (ref 5–40)

## 2019-12-04 ENCOUNTER — Telehealth: Payer: Self-pay

## 2019-12-04 NOTE — Telephone Encounter (Signed)
Patient name and DOB has been verified Patient was informed of lab results. Patient had no questions.  

## 2019-12-04 NOTE — Telephone Encounter (Signed)
-----   Message from Charlott Rakes, MD sent at 12/03/2019  2:15 PM EDT ----- Please inform the patient that labs are normal. Thank you.

## 2019-12-07 ENCOUNTER — Encounter: Payer: Self-pay | Admitting: Pharmacist

## 2019-12-07 ENCOUNTER — Ambulatory Visit: Payer: Self-pay | Attending: Family Medicine | Admitting: Pharmacist

## 2019-12-07 ENCOUNTER — Other Ambulatory Visit: Payer: Self-pay

## 2019-12-07 DIAGNOSIS — E1165 Type 2 diabetes mellitus with hyperglycemia: Secondary | ICD-10-CM

## 2019-12-07 DIAGNOSIS — Z794 Long term (current) use of insulin: Secondary | ICD-10-CM

## 2019-12-07 MED FILL — !LANTUS SOLOSTAR 100UNITS/M: 100 | 26 days supply | Qty: 12 | Fill #1

## 2019-12-07 NOTE — Progress Notes (Signed)
    S:     PCP: Dr. Margarita Rana  No chief complaint on file.  Patient arrives in good spirits.  Presents for diabetes evaluation, education, and management. Pharmacy last saw him 11/16/19 and instructed him to self-titrate Lantus. He was seen by his PCP on 12/02/2019 and reported improvement in home sugars. Today, he reports doing well.  Patient reports diabetes was diagnosed in 06/2019.   Family/Social History: -Fhx: HTN and DM (mother) -Tobacco use: never smoker  Human resources officer affordability: Self-pay  Patient reports adherence with medications.  Current diabetes medications include: Lantus 43 units daily, Metformin 1000 mg BID  Patient denies hypoglycemic events.  Patient reported dietary habits: reports eating mostly grilled chicken and vegetables during the week with 1 cheat day (Sundays)  Patient-reported exercise habits:  - Lifts at [employer] daily - Incorporating more Cardio; walks and runs on the treadmill - Reports walking daily but does not give a specific amount of time   Patient reports improvement nocturia (nighttime urination).  Patient reports baseline neuropathy (nerve pain). Patient reports baseline visual changes.   O:  POCT: 188 (admits to donuts and [employer] last night)  Home fasting blood sugars: reports mostly 100s with highest in the 180-190 range. Denies any home CBGs above 200.  Lab Results  Component Value Date   HGBA1C 12.8 (A) 10/26/2019   There were no vitals filed for this visit.  Lipid Panel     Component Value Date/Time   CHOL 152 12/02/2019 0926   TRIG 103 12/02/2019 0926   HDL 50 12/02/2019 0926   CHOLHDL 3.0 12/02/2019 0926   LDLCALC 83 12/02/2019 0926    Clinical Atherosclerotic Cardiovascular Disease (ASCVD): No  The 10-year ASCVD risk score Mikey Bussing DC Jr., et al., 2013) is: 12.7%   Values used to calculate the score:     Age: 44 years     Sex: Male     Is Non-Hispanic African American: Yes     Diabetic: Yes  Tobacco smoker: No     Systolic Blood Pressure: A999333 mmHg     Is BP treated: Yes     HDL Cholesterol: 50 mg/dL     Total Cholesterol: 152 mg/dL    A/P: Diabetes longstanding currently uncontrolled. Patient is able to verbalize appropriate hypoglycemia management plan. Patient is adherent with medication. -Instructions given to self-titrate every 3 days by 2 units to achieve goal fasting CBG of 130 or lower. Max 48 units.  -Continue Metformin to 1000 mg BID -Extensively discussed pathophysiology of diabetes, recommended lifestyle interventions, dietary effects on blood sugar control -Counseled on s/sx of and management of hypoglycemia -Next A1C anticipated 02/2020.   Written patient instructions provided.  Total time in face to face counseling 25 minutes.   Follow up Pharmacist Clinic Visit in 3 weeks.     Benard Halsted, PharmD, [place] [phone]

## 2019-12-21 ENCOUNTER — Encounter: Payer: Self-pay | Admitting: Pharmacist

## 2019-12-21 ENCOUNTER — Other Ambulatory Visit: Payer: Self-pay

## 2019-12-21 ENCOUNTER — Ambulatory Visit: Payer: Self-pay | Attending: Family Medicine | Admitting: Pharmacist

## 2019-12-21 DIAGNOSIS — E1165 Type 2 diabetes mellitus with hyperglycemia: Secondary | ICD-10-CM

## 2019-12-21 DIAGNOSIS — Z794 Long term (current) use of insulin: Secondary | ICD-10-CM

## 2019-12-21 NOTE — Progress Notes (Signed)
     S:     PCP: Dr. Margarita Rana  No chief complaint on file.  Patient arrives in good spirits.  Presents for diabetes evaluation, education, and management. Pharmacy last saw him 12/07/2019 and instructed him to self-titrate Lantus. He was seen by his PCP on 12/02/2019 and reported improvement in home sugars. Today, he reports doing well.  Patient reports diabetes was diagnosed in 06/2019.   Family/Social History: -Fhx: HTN and DM (mother) -Tobacco use: never smoker  Human resources officer affordability: Self-pay  Patient reports adherence with medications.  Current diabetes medications include: Lantus 46 units daily, Metformin 1000 mg BID  Patient denies hypoglycemic events.  Patient reported dietary habits: reports eating mostly grilled chicken and vegetables during the week with 1 cheat day (Sundays);   Patient-reported exercise habits:  - Lifts at [employer] daily - Incorporating more Cardio; walks and runs on the treadmill - Reports walking daily but does not give a specific amount of time   Patient reports improvement nocturia (nighttime urination).  Patient reports baseline neuropathy (nerve pain). Patient reports improvement in vision.    O:  POCT: 94 (fasting)  Home blood sugar range:  - Gives range throughout the day: 90s-140s  Lab Results  Component Value Date   HGBA1C 12.8 (A) 10/26/2019   There were no vitals filed for this visit.  Lipid Panel     Component Value Date/Time   CHOL 152 12/02/2019 0926   TRIG 103 12/02/2019 0926   HDL 50 12/02/2019 0926   CHOLHDL 3.0 12/02/2019 0926   LDLCALC 83 12/02/2019 0926    Clinical Atherosclerotic Cardiovascular Disease (ASCVD): No  The 10-year ASCVD risk score Mikey Bussing DC Jr., et al., 2013) is: 12.7%   Values used to calculate the score:     Age: 44 years     Sex: Male     Is Non-Hispanic African American: Yes     Diabetic: Yes     Tobacco smoker: No     Systolic Blood Pressure: A999333 mmHg     Is BP  treated: Yes     HDL Cholesterol: 50 mg/dL     Total Cholesterol: 152 mg/dL    A/P: Diabetes longstanding currently uncontrolled. Patient is able to verbalize appropriate hypoglycemia management plan. Patient is adherent with medication. -Continue Lantus 46 units daily.  -Continue Metformin to 1000 mg BID. -Extensively discussed pathophysiology of diabetes, recommended lifestyle interventions, dietary effects on blood sugar control -Counseled on s/sx of and management of hypoglycemia -Next A1C anticipated 02/2020.   Written patient instructions provided.  Total time in face to face counseling 25 minutes.   Follow up w/ Dr. Margarita Rana 03/02/2020.      Benard Halsted, PharmD, [place] [phone]

## 2019-12-24 MED FILL — GABAPENTIN 300 MG CAPSULE: 300 | 30 days supply | Qty: 120 | Fill #1

## 2019-12-24 MED FILL — METFORMIN HCL 500 MG TABS: 500 | 30 days supply | Qty: 120 | Fill #1

## 2019-12-24 MED FILL — ATORVASTATIN CALCIUM 20 MG: 20 | 30 days supply | Qty: 30 | Fill #2

## 2019-12-24 MED FILL — MELOXICAM 7.5 MG TABLET: 7.5 | 30 days supply | Qty: 30 | Fill #2

## 2019-12-25 MED FILL — TRUE METRIX TEST STRIP: 100 days supply | Qty: 100 | Fill #1

## 2019-12-28 MED FILL — $LANTUS SOLOSTAR 100 UNITS/: 100 | 26 days supply | Qty: 12 | Fill #2

## 2019-12-28 MED FILL — SILDENAFIL CITRATE 50 MG TA: 50 | 30 days supply | Qty: 10 | Fill #1

## 2020-01-18 ENCOUNTER — Emergency Department (HOSPITAL_COMMUNITY)
Admission: EM | Admit: 2020-01-18 | Discharge: 2020-01-18 | Disposition: A | Discharge status: Home / Self Care | Payer: Self-pay | Attending: Emergency Medicine | Admitting: Emergency Medicine

## 2020-01-18 ENCOUNTER — Encounter (HOSPITAL_COMMUNITY): Payer: Self-pay

## 2020-01-18 ENCOUNTER — Other Ambulatory Visit: Payer: Self-pay

## 2020-01-18 DIAGNOSIS — Z5321 Procedure and treatment not carried out due to patient leaving prior to being seen by health care provider: Secondary | ICD-10-CM | POA: Insufficient documentation

## 2020-01-18 DIAGNOSIS — R1031 Right lower quadrant pain: Secondary | ICD-10-CM | POA: Insufficient documentation

## 2020-01-18 DIAGNOSIS — R112 Nausea with vomiting, unspecified: Secondary | ICD-10-CM | POA: Insufficient documentation

## 2020-01-18 DIAGNOSIS — M25512 Pain in left shoulder: Secondary | ICD-10-CM | POA: Insufficient documentation

## 2020-01-18 HISTORY — DX: Type 2 diabetes mellitus without complications: E11.9

## 2020-01-18 HISTORY — DX: Essential (primary) hypertension: I10

## 2020-01-18 NOTE — ED Notes (Signed)
Pt returned blanket, urine cup, and BP cuff. Pt stated that he was leaving. Pt will be moved OTF.

## 2020-01-18 NOTE — ED Triage Notes (Signed)
Pt arrives to ED w/ c/o R sided flank pain. Pt endorses n/v, 10/10 pain. Pt states his urine has been dark and urinating is painful. Pt states he has hx of kidney stones. Pt also c/o 10/10 L shoulder pain, states he may have hurt it at the gym.

## 2020-01-25 MED FILL — $LANTUS SOLOSTAR 100 UNITS/: 100 | 26 days supply | Qty: 12 | Fill #3

## 2020-01-25 MED FILL — TRUEplus LANCETS 28G MISC: 33 days supply | Qty: 100 | Fill #1

## 2020-02-04 MED FILL — ATORVASTATIN CALCIUM 20 MG: 20 | 30 days supply | Qty: 30 | Fill #3

## 2020-02-04 MED FILL — TRUEplus LANCETS 28G MISC: 33 days supply | Qty: 100 | Fill #1

## 2020-02-04 MED FILL — METFORMIN HCL 500 MG TABS: 500 | 30 days supply | Qty: 120 | Fill #2

## 2020-02-04 MED FILL — MELOXICAM 7.5 MG TABLET: 7.5 | 30 days supply | Qty: 30 | Fill #3

## 2020-02-16 ENCOUNTER — Ambulatory Visit: Payer: Self-pay | Attending: Family Medicine | Admitting: Physical Therapy

## 2020-02-18 ENCOUNTER — Encounter: Payer: Self-pay | Admitting: Physical Therapy

## 2020-02-23 ENCOUNTER — Encounter: Payer: Self-pay | Admitting: Physical Therapy

## 2020-02-23 MED FILL — GABAPENTIN 300 MG CAPSULE: 300 | 30 days supply | Qty: 120 | Fill #2

## 2020-02-23 MED FILL — $LANTUS SOLOSTAR 100 UNITS/: 100 | 26 days supply | Qty: 12 | Fill #4

## 2020-02-25 ENCOUNTER — Encounter: Payer: Self-pay | Admitting: Physical Therapy

## 2020-03-01 ENCOUNTER — Encounter: Payer: Self-pay | Admitting: Physical Therapy

## 2020-03-02 ENCOUNTER — Other Ambulatory Visit: Payer: Self-pay

## 2020-03-02 ENCOUNTER — Encounter: Payer: Self-pay | Admitting: Family Medicine

## 2020-03-02 ENCOUNTER — Ambulatory Visit: Payer: Self-pay | Attending: Family Medicine | Admitting: Family Medicine

## 2020-03-02 VITALS — BP 137/90 | HR 78 | Ht 73.0 in | Wt 347.0 lb

## 2020-03-02 DIAGNOSIS — Z794 Long term (current) use of insulin: Secondary | ICD-10-CM

## 2020-03-02 DIAGNOSIS — M7502 Adhesive capsulitis of left shoulder: Secondary | ICD-10-CM

## 2020-03-02 DIAGNOSIS — M25561 Pain in right knee: Secondary | ICD-10-CM

## 2020-03-02 DIAGNOSIS — G8929 Other chronic pain: Secondary | ICD-10-CM

## 2020-03-02 DIAGNOSIS — I10 Essential (primary) hypertension: Secondary | ICD-10-CM

## 2020-03-02 DIAGNOSIS — E1165 Type 2 diabetes mellitus with hyperglycemia: Secondary | ICD-10-CM

## 2020-03-02 LAB — GLUCOSE, POCT (MANUAL RESULT ENTRY): POC Glucose: 102 mg/dl — AB (ref 70–99)

## 2020-03-02 LAB — POCT GLYCOSYLATED HEMOGLOBIN (HGB A1C): HbA1c, POC (controlled diabetic range): 6.2 % (ref 0.0–7.0)

## 2020-03-02 MED ORDER — CYCLOBENZAPRINE HCL 10 MG PO TABS: 10.0000 mg | ORAL_TABLET | Freq: Every day | ORAL | 3 refills | Status: DC

## 2020-03-02 MED ORDER — LANTUS SOLOSTAR 100 UNIT/ML ~~LOC~~ SOPN: 43.0000 [IU] | PEN_INJECTOR | Freq: Every day | SUBCUTANEOUS | 3 refills | Status: DC

## 2020-03-02 MED ORDER — ATORVASTATIN CALCIUM 20 MG PO TABS: 20.0000 mg | ORAL_TABLET | Freq: Every day | ORAL | 6 refills | Status: DC

## 2020-03-02 MED ORDER — METFORMIN HCL 500 MG PO TABS: 1000.0000 mg | ORAL_TABLET | Freq: Two times a day (BID) | ORAL | 6 refills | Status: DC

## 2020-03-02 MED ORDER — MELOXICAM 7.5 MG PO TABS: 7.5000 mg | ORAL_TABLET | Freq: Every day | ORAL | 3 refills | Status: DC

## 2020-03-02 MED ORDER — PREDNISONE 20 MG PO TABS: 20.0000 mg | ORAL_TABLET | Freq: Every day | ORAL | 0 refills | Status: DC

## 2020-03-02 MED FILL — predniSONE 20 MG TABS: 20 | 5 days supply | Qty: 5 | Fill #0

## 2020-03-02 MED FILL — MELOXICAM 7.5 MG TABLET: 7.5 | 30 days supply | Qty: 30 | Fill #0

## 2020-03-02 MED FILL — CYCLOBENZAPRINE 10 MG TAB: 10 | 30 days supply | Qty: 30 | Fill #0

## 2020-03-02 MED FILL — ATORVASTATIN CALCIUM 20 MG: 20 | 30 days supply | Qty: 30 | Fill #0

## 2020-03-02 MED FILL — METFORMIN HCL 500 MG TABS: 500 | 30 days supply | Qty: 120 | Fill #0

## 2020-03-02 NOTE — Progress Notes (Signed)
Subjective:  Patient ID: Victor Hale, male    DOB: [birth year]  Age: 44 y.o. MRN: [id number]  CC: Diabetes and Hypertension   HPI [name] is a 44 year old male with a history of hypertension, type 2 diabetes mellitus (A1c 12.8), obesity, chronic knee pain who presents today for follow-up visit. He is A1c 6.2 which is down from 12.8 previously and he endorses compliance with a diabetic diet, exercising regularly at the gym. He has had sugars in the 78 and 87 and sometimes feels like he will pas out. Highest fasting sugar has been around 120. Currently on 46 units of Lantus at bedtime. He does not check random sugars. Denies presence of neuropathy, visual concerns.  His R knee and L shoulder hurt to the point that he is unable to lift his shoulder. His R knee gives out and is sometimes swollen. Referred for PT but he was unable to pay the $500 which was requested as he has no medical coverage. He does have a knee brace but does not like to wear this as it does not look good with his dressing. Also complains of pain keeps him at night and he needs something to help him sleep. His blood pressure is slightly elevated but at his last visit it was 122/71 and he has been off his antihypertensive due to previously controlled blood pressure on being on diet control. Past Medical History:  Diagnosis Date  . Diabetes mellitus without complication ([place])   . Fatty liver   . Hypertension     History reviewed. No pertinent surgical history.  Family History  Problem Relation Age of Onset  . Hypertension Mother   . Diabetes Mother     Allergies  Allergen Reactions  . Penicillins Itching    Outpatient Medications Prior to Visit  Medication Sig Dispense Refill  . Blood Glucose Monitoring Suppl (TRUE METRIX METER) DEVI 1 each by Does not apply route daily before breakfast. 100 each 12  . glucose blood (TRUE METRIX BLOOD GLUCOSE TEST) test strip Use daily before breakfast 100 each 12  .  Insulin Pen Needle 31G X 5 MM MISC 1 each by Does not apply route at bedtime. 30 each 5  . lidocaine (XYLOCAINE) 2 % jelly Apply topically.    Marland Kitchen lisinopril-hydrochlorothiazide (ZESTORETIC) 20-25 MG tablet Take 1 tablet by mouth daily. 30 tablet 3  . sildenafil (VIAGRA) 50 MG tablet Take 1 tablet (50 mg total) by mouth daily as needed for erectile dysfunction. At least 24 hours between doses 10 tablet 1  . TRUEplus Lancets 28G MISC 1 each by Does not apply route 3 (three) times daily before meals. 100 each 12  . atorvastatin (LIPITOR) 20 MG tablet Take 1 tablet (20 mg total) by mouth daily. 30 tablet 3  . insulin glargine (LANTUS SOLOSTAR) 100 UNIT/ML Solostar Pen Inject 30 Units into the skin daily. Patient may increase by 2 units every 3 days until fasting sugar is < 130. Max 45 units. 5 pen 3  . meloxicam (MOBIC) 7.5 MG tablet Take 1 tablet (7.5 mg total) by mouth daily. 30 tablet 3  . metFORMIN (GLUCOPHAGE) 500 MG tablet Take 2 tablets (1,000 mg total) by mouth 2 (two) times daily with a meal. 120 tablet 2  . methocarbamol (ROBAXIN) 500 MG tablet Take 1 tablet (500 mg total) by mouth every 8 (eight) hours as needed for muscle spasms. (Patient not taking: Reported on 10/26/2019) 60 tablet 2   No facility-administered medications prior to visit.  ROS Review of Systems  Constitutional: Negative for activity change and appetite change.  HENT: Negative for sinus pressure and sore throat.   Eyes: Negative for visual disturbance.  Respiratory: Negative for cough, chest tightness and shortness of breath.   Cardiovascular: Negative for chest pain and leg swelling.  Gastrointestinal: Negative for abdominal distention, abdominal pain, constipation and diarrhea.  Endocrine: Negative.   Genitourinary: Negative for dysuria.  Musculoskeletal:       See HPI  Skin: Negative for rash.  Allergic/Immunologic: Negative.   Neurological: Negative for weakness, light-headedness and numbness.    Psychiatric/Behavioral: Negative for dysphoric mood and suicidal ideas.    Objective:  BP (!) 137/90   Pulse 78   Ht 6\' 1"  (1.854 m)   Wt (!) 347 lb (157.4 kg)   SpO2 96%   BMI 45.78 kg/m   BP/Weight 03/02/2020 01/18/2020 6/76/1950  Systolic BP 932 671 245  Diastolic BP 90 71 80  Wt. (Lbs) 347 270 335  BMI 45.78 35.62 43.01      Physical Exam Constitutional:      Appearance: He is well-developed.  Neck:     Vascular: No JVD.  Cardiovascular:     Rate and Rhythm: Normal rate.     Heart sounds: Normal heart sounds. No murmur heard.   Pulmonary:     Effort: Pulmonary effort is normal.     Breath sounds: Normal breath sounds. No wheezing or rales.  Chest:     Chest wall: No tenderness.  Abdominal:     General: Bowel sounds are normal. There is no distension.     Palpations: Abdomen is soft. There is no mass.     Tenderness: There is no abdominal tenderness.  Musculoskeletal:     Right lower leg: No edema.     Left lower leg: No edema.     Comments: Abduction of left shoulder limited to 100 degrees with associated tenderness on palpation of anterior AC joint Right shoulder is normal Severe tenderness on flexion and extension of right knee with associated crepitus; left knee is normal.  Neurological:     Mental Status: He is alert and oriented to person, place, and time.  Psychiatric:        Mood and Affect: Mood normal.     CMP Latest Ref Rng & Units 10/26/2019 08/27/2019 06/23/2019  Glucose 65 - 99 mg/dL 472(H) 197(H) 116(H)  BUN 6 - 24 mg/dL 11 15 9   Creatinine 0.76 - 1.27 mg/dL 0.97 0.80 0.84  Sodium 134 - 144 mmol/L 139 138 141  Potassium 3.5 - 5.2 mmol/L 4.7 4.2 4.3  Chloride 96 - 106 mmol/L 103 102 104  CO2 20 - 29 mmol/L 10(L) 21 19(L)  Calcium 8.7 - 10.2 mg/dL 9.3 10.0 9.7  Total Protein 6.0 - 8.5 g/dL - - 7.6  Total Bilirubin 0.0 - 1.2 mg/dL - - 0.4  Alkaline Phos 39 - 117 IU/L - - 55  AST 0 - 40 IU/L - - 53(H)  ALT 0 - 44 IU/L - - 73(H)    Lipid  Panel     Component Value Date/Time   CHOL 152 12/02/2019 0926   TRIG 103 12/02/2019 0926   HDL 50 12/02/2019 0926   CHOLHDL 3.0 12/02/2019 0926   LDLCALC 83 12/02/2019 0926    CBC    Component Value Date/Time   WBC 12.8 (H) 01/31/2019 0536   RBC 4.56 01/31/2019 0536   HGB 14.3 01/31/2019 0536   HGB 14.6 06/20/2014 1422  HCT 42.4 01/31/2019 0536   HCT 43.0 06/20/2014 1422   PLT 287 01/31/2019 0536   PLT 251 06/20/2014 1422   MCV 93.0 01/31/2019 0536   MCV 94 06/20/2014 1422   MCH 31.4 01/31/2019 0536   MCHC 33.7 01/31/2019 0536   RDW 12.4 01/31/2019 0536   RDW 12.9 06/20/2014 1422   LYMPHSABS 2.1 09/17/2016 1218   LYMPHSABS 2.0 06/20/2014 1422   MONOABS 1.6 (H) 09/17/2016 1218   MONOABS 0.7 06/20/2014 1422   EOSABS 0.0 09/17/2016 1218   EOSABS 0.2 06/20/2014 1422   BASOSABS 0.0 09/17/2016 1218   BASOSABS 0.1 06/20/2014 1422    Lab Results  Component Value Date   HGBA1C 6.2 03/02/2020    Assessment & Plan:  1. Type 2 diabetes mellitus with hyperglycemia, with long-term current use of insulin (HCC) Controlled with A1c of 6.2; goal is less than 7.0 Commended on significant improvement We will reduce his insulin from 46 units to 43 units to prevent hypoglycemia and he has been encouraged to avoid skipping meals Counseled on Diabetic diet, my plate method, 423 minutes of moderate intensity exercise/week Blood sugar logs with fasting goals of 80-120 mg/dl, random of less than 180 and in the event of sugars less than 60 mg/dl or greater than 400 mg/dl encouraged to notify the clinic. Advised on the need for annual eye exams, annual foot exams, Pneumonia vaccine. - POCT glucose (manual entry) - POCT glycosylated hemoglobin (Hb A1C) - insulin glargine (LANTUS SOLOSTAR) 100 UNIT/ML Solostar Pen; Inject 43 Units into the skin daily. Patient may increase by 2 units every 3 days until fasting sugar is < 130. Max 45 units.  Dispense: 5 pen; Refill: 3 - metFORMIN (GLUCOPHAGE)  500 MG tablet; Take 2 tablets (1,000 mg total) by mouth 2 (two) times daily with a meal.  Dispense: 120 tablet; Refill: 6 - atorvastatin (LIPITOR) 20 MG tablet; Take 1 tablet (20 mg total) by mouth daily.  Dispense: 30 tablet; Refill: 6  2. Chronic pain of right knee Uncontrolled I had referred him for a right knee x-ray 3 months ago however he never had this done and has been encouraged to do so He will benefit from PT and possibly corticosteroid injections but unfortunately has no medical coverage for this Advised to apply for the [place] discount to facilitate this Encouraged to use his knee brace We will place on short course of prednisone after which he will continue with an NSAID - predniSONE (DELTASONE) 20 MG tablet; Take 1 tablet (20 mg total) by mouth daily with breakfast. Complete prednisone prior to initiating meloxicam  Dispense: 5 tablet; Refill: 0 - meloxicam (MOBIC) 7.5 MG tablet; Take 1 tablet (7.5 mg total) by mouth daily.  Dispense: 30 tablet; Refill: 3  3. Adhesive capsulitis of left shoulder He will benefit from PT See #2 above - predniSONE (DELTASONE) 20 MG tablet; Take 1 tablet (20 mg total) by mouth daily with breakfast. Complete prednisone prior to initiating meloxicam  Dispense: 5 tablet; Refill: 0 - cyclobenzaprine (FLEXERIL) 10 MG tablet; Take 1 tablet (10 mg total) by mouth at bedtime.  Dispense: 30 tablet; Refill: 3  4. Essential hypertension Uncontrolled today but previously controlled We will reassess at next visit and consider restarting his antihypertensive at lower dose if blood pressure remains elevated Counseled on blood pressure goal of less than 130/80, low-sodium, DASH diet, medication compliance, 150 minutes of moderate intensity exercise per week. Discussed medication compliance, adverse effects.    Meds ordered this encounter  Medications  . insulin glargine (LANTUS SOLOSTAR) 100 UNIT/ML Solostar Pen    Sig: Inject 43 Units into the skin  daily. Patient may increase by 2 units every 3 days until fasting sugar is < 130. Max 45 units.    Dispense:  5 pen    Refill:  3  . predniSONE (DELTASONE) 20 MG tablet    Sig: Take 1 tablet (20 mg total) by mouth daily with breakfast. Complete prednisone prior to initiating meloxicam    Dispense:  5 tablet    Refill:  0  . meloxicam (MOBIC) 7.5 MG tablet    Sig: Take 1 tablet (7.5 mg total) by mouth daily.    Dispense:  30 tablet    Refill:  3  . metFORMIN (GLUCOPHAGE) 500 MG tablet    Sig: Take 2 tablets (1,000 mg total) by mouth 2 (two) times daily with a meal.    Dispense:  120 tablet    Refill:  6  . cyclobenzaprine (FLEXERIL) 10 MG tablet    Sig: Take 1 tablet (10 mg total) by mouth at bedtime.    Dispense:  30 tablet    Refill:  3  . atorvastatin (LIPITOR) 20 MG tablet    Sig: Take 1 tablet (20 mg total) by mouth daily.    Dispense:  30 tablet    Refill:  6    Follow-up: Return in about 3 months (around 06/02/2020) for Chronic medical conditions.       Charlott Rakes, MD, FAAFP. [facility] [place]  [place], [place]   03/02/2020, 9:23 AM

## 2020-03-02 NOTE — Patient Instructions (Signed)

## 2020-03-03 ENCOUNTER — Encounter: Payer: Self-pay | Admitting: Physical Therapy

## 2020-03-08 ENCOUNTER — Encounter: Payer: Self-pay | Admitting: Physical Therapy

## 2020-03-10 ENCOUNTER — Encounter: Payer: Self-pay | Admitting: Physical Therapy

## 2020-03-11 ENCOUNTER — Ambulatory Visit: Payer: Self-pay

## 2020-03-28 MED FILL — $LANTUS SOLOSTAR 100 UNITS/: 100 | 33 days supply | Qty: 15 | Fill #0

## 2020-04-04 MED FILL — MELOXICAM 7.5 MG TABLET: 7.5 | 30 days supply | Qty: 30 | Fill #1

## 2020-04-04 MED FILL — ATORVASTATIN CALCIUM 20 MG: 20 | 30 days supply | Qty: 30 | Fill #1

## 2020-04-08 MED FILL — GABAPENTIN 300 MG CAPSULE: 300 | 30 days supply | Qty: 120 | Fill #3

## 2020-04-15 MED FILL — GABAPENTIN 300 MG CAPSULE: 300 | 30 days supply | Qty: 120 | Fill #3

## 2020-04-27 ENCOUNTER — Other Ambulatory Visit: Payer: Self-pay

## 2020-04-27 ENCOUNTER — Ambulatory Visit: Payer: Self-pay | Attending: Family Medicine | Admitting: Family Medicine

## 2020-04-27 ENCOUNTER — Encounter: Payer: Self-pay | Admitting: Family Medicine

## 2020-04-27 ENCOUNTER — Other Ambulatory Visit: Payer: Self-pay | Admitting: Family Medicine

## 2020-04-27 VITALS — BP 113/71 | HR 81 | Ht 73.0 in | Wt 347.0 lb

## 2020-04-27 DIAGNOSIS — M25561 Pain in right knee: Secondary | ICD-10-CM

## 2020-04-27 DIAGNOSIS — G8929 Other chronic pain: Secondary | ICD-10-CM

## 2020-04-27 DIAGNOSIS — G629 Polyneuropathy, unspecified: Secondary | ICD-10-CM

## 2020-04-27 MED ORDER — KETOROLAC TROMETHAMINE 60 MG/2ML IM SOLN
60.0000 mg | Freq: Once | INTRAMUSCULAR | Status: AC
  Administered 2020-04-27: 60 mg via INTRAMUSCULAR

## 2020-04-27 MED ORDER — TRAMADOL HCL 50 MG PO TABS: 50.0000 mg | ORAL_TABLET | Freq: Every day | ORAL | 0 refills | Status: AC

## 2020-04-27 MED ORDER — GABAPENTIN 300 MG PO CAPS: 600.0000 mg | ORAL_CAPSULE | Freq: Two times a day (BID) | ORAL | 3 refills | Status: DC

## 2020-04-27 MED FILL — METFORMIN HCL 500 MG TABS: 500 | 30 days supply | Qty: 120 | Fill #1

## 2020-04-27 MED FILL — $LANTUS SOLOSTAR 100 UNITS/: 100 | 33 days supply | Qty: 15 | Fill #1

## 2020-04-27 MED FILL — traMADol HCL 50 MG TABS: 50 | 30 days supply | Qty: 30 | Fill #0

## 2020-04-27 NOTE — Progress Notes (Signed)
States that his legs are burning and painful.  Knee swelling.  Wants to discuss vaccine.

## 2020-04-27 NOTE — Patient Instructions (Signed)

## 2020-04-27 NOTE — Progress Notes (Signed)
Subjective:  Patient ID: Victor Hale, male    DOB: 30-Apr-1976  Age: 44 y.o. MRN: [id number]  Chief Complaint  Patient presents with  . Knee Pain     HPI Victor Hale is a 44 year old male with a history of hypertension, type 2 diabetes mellitus (A1c 6.2), obesity, chronic knee pain who presents today for follow-up visit. Has questions about when he can come off his Diabetic medications since his A1c is 6.2 and he has been adherent with a diabetic diet and exercise.  His R knee and legs "have been on fire" and his R knees buckle. Legs swell up especially after work ( he stands for prolonged hours at work) [employer] like "his nerves are jumping" and is currently on Gabapentin 600mg  bid Referred for R knee xray which he never underwent as he forgot. Referred for PT which he could not afford. Advised on several occasions to apply for the [facility] discount to facilitate referrals to specialists however he is yet to do so.  Past Medical History:  Diagnosis Date  . Diabetes mellitus without complication ([place])   . Fatty liver   . Hypertension     History reviewed. No pertinent surgical history.  Family History  Problem Relation Age of Onset  . Hypertension Mother   . Diabetes Mother     Allergies  Allergen Reactions  . Penicillins Itching    Outpatient Medications Prior to Visit  Medication Sig Dispense Refill  . atorvastatin (LIPITOR) 20 MG tablet Take 1 tablet (20 mg total) by mouth daily. 30 tablet 6  . Blood Glucose Monitoring Suppl (TRUE METRIX METER) DEVI 1 each by Does not apply route daily before breakfast. 100 each 12  . cyclobenzaprine (FLEXERIL) 10 MG tablet Take 1 tablet (10 mg total) by mouth at bedtime. 30 tablet 3  . glucose blood (TRUE METRIX BLOOD GLUCOSE TEST) test strip Use daily before breakfast 100 each 12  . insulin glargine (LANTUS SOLOSTAR) 100 UNIT/ML Solostar Pen Inject 43 Units into the skin daily. Patient may increase by 2 units every 3 days until fasting  sugar is < 130. Max 45 units. 5 pen 3  . Insulin Pen Needle 31G X 5 MM MISC 1 each by Does not apply route at bedtime. 30 each 5  . lidocaine (XYLOCAINE) 2 % jelly Apply topically.    Marland Kitchen lisinopril-hydrochlorothiazide (ZESTORETIC) 20-25 MG tablet Take 1 tablet by mouth daily. 30 tablet 3  . meloxicam (MOBIC) 7.5 MG tablet Take 1 tablet (7.5 mg total) by mouth daily. 30 tablet 3  . metFORMIN (GLUCOPHAGE) 500 MG tablet Take 2 tablets (1,000 mg total) by mouth 2 (two) times daily with a meal. 120 tablet 6  . predniSONE (DELTASONE) 20 MG tablet Take 1 tablet (20 mg total) by mouth daily with breakfast. Complete prednisone prior to initiating meloxicam 5 tablet 0  . sildenafil (VIAGRA) 50 MG tablet Take 1 tablet (50 mg total) by mouth daily as needed for erectile dysfunction. At least 24 hours between doses 10 tablet 1  . TRUEplus Lancets 28G MISC 1 each by Does not apply route 3 (three) times daily before meals. 100 each 12   No facility-administered medications prior to visit.     ROS Review of Systems  Constitutional: Negative for activity change and appetite change.  HENT: Negative for sinus pressure and sore throat.   Eyes: Negative for visual disturbance.  Respiratory: Negative for cough, chest tightness and shortness of breath.   Cardiovascular: Negative for chest pain and  leg swelling.  Gastrointestinal: Negative for abdominal distention, abdominal pain, constipation and diarrhea.  Endocrine: Negative.   Genitourinary: Negative for dysuria.  Musculoskeletal:       See HPI  Skin: Negative for rash.  Allergic/Immunologic: Negative.   Neurological: Negative for weakness, light-headedness and numbness.  Psychiatric/Behavioral: Negative for dysphoric mood and suicidal ideas.    Objective:  BP 113/71   Pulse 81   Ht 6\' 1"  (1.854 m)   Wt (!) 347 lb (157.4 kg)   SpO2 99%   BMI 45.78 kg/m   BP/Weight 04/27/2020 03/02/2020 3/71/6967  Systolic BP 893 810 175  Diastolic BP 71 90 71    Wt. (Lbs) 347 347 270  BMI 45.78 45.78 35.62      Physical Exam Constitutional:      Appearance: He is well-developed.  Neck:     Vascular: No JVD.  Cardiovascular:     Rate and Rhythm: Normal rate.     Heart sounds: Normal heart sounds. No murmur heard.   Pulmonary:     Effort: Pulmonary effort is normal.     Breath sounds: Normal breath sounds. No wheezing or rales.  Chest:     Chest wall: No tenderness.  Abdominal:     General: Bowel sounds are normal. There is no distension.     Palpations: Abdomen is soft. There is no mass.     Tenderness: There is no abdominal tenderness.  Musculoskeletal:        General: Normal range of motion.     Right lower leg: No edema.     Left lower leg: No edema.     Comments: R knee edema Crepitus on knee exam b/l TTP of R knee  Neurological:     Mental Status: He is alert and oriented to person, place, and time.  Psychiatric:        Mood and Affect: Mood normal.     CMP Latest Ref Rng & Units 10/26/2019 08/27/2019 06/23/2019  Glucose 65 - 99 mg/dL 472(H) 197(H) 116(H)  BUN 6 - 24 mg/dL 11 15 9   Creatinine 0.76 - 1.27 mg/dL 0.97 0.80 0.84  Sodium 134 - 144 mmol/L 139 138 141  Potassium 3.5 - 5.2 mmol/L 4.7 4.2 4.3  Chloride 96 - 106 mmol/L 103 102 104  CO2 20 - 29 mmol/L 10(L) 21 19(L)  Calcium 8.7 - 10.2 mg/dL 9.3 10.0 9.7  Total Protein 6.0 - 8.5 g/dL - - 7.6  Total Bilirubin 0.0 - 1.2 mg/dL - - 0.4  Alkaline Phos 39 - 117 IU/L - - 55  AST 0 - 40 IU/L - - 53(H)  ALT 0 - 44 IU/L - - 73(H)    Lipid Panel     Component Value Date/Time   CHOL 152 12/02/2019 0926   TRIG 103 12/02/2019 0926   HDL 50 12/02/2019 0926   CHOLHDL 3.0 12/02/2019 0926   LDLCALC 83 12/02/2019 0926    CBC    Component Value Date/Time   WBC 12.8 (H) 01/31/2019 0536   RBC 4.56 01/31/2019 0536   HGB 14.3 01/31/2019 0536   HGB 14.6 06/20/2014 1422   HCT 42.4 01/31/2019 0536   HCT 43.0 06/20/2014 1422   PLT 287 01/31/2019 0536   PLT 251  06/20/2014 1422   MCV 93.0 01/31/2019 0536   MCV 94 06/20/2014 1422   MCH 31.4 01/31/2019 0536   MCHC 33.7 01/31/2019 0536   RDW 12.4 01/31/2019 0536   RDW 12.9 06/20/2014 1422   LYMPHSABS  2.1 09/17/2016 1218   LYMPHSABS 2.0 06/20/2014 1422   MONOABS 1.6 (H) 09/17/2016 1218   MONOABS 0.7 06/20/2014 1422   EOSABS 0.0 09/17/2016 1218   EOSABS 0.2 06/20/2014 1422   BASOSABS 0.0 09/17/2016 1218   BASOSABS 0.1 06/20/2014 1422    Lab Results  Component Value Date   HGBA1C 6.2 03/02/2020    Assessment & Plan:  1. Chronic pain of right knee Underlying Osteoarthritis Advised he needs to undergo R knee xray previously ordered Need to see Ortho - Advised again to apply for the [facility] [employer] discount Placed on Tramadol qhs - Ambulatory referral to Orthopedic Surgery - Drug Screen 12+Alcohol+CRT, Ur - traMADol (ULTRAM) 50 MG tablet; Take 1 tablet (50 mg total) by mouth at bedtime for 5 days.  Dispense: 30 tablet; Refill: 0 - ketorolac (TORADOL) injection 60 mg  2. Neuropathy Uncontrolled Unclear if this is Diabetic neuropathy vs pain from Osteoarthritis - gabapentin (NEURONTIN) 300 MG capsule; Take 2 capsules (600 mg total) by mouth 2 (two) times daily.  Dispense: 120 capsule; Refill: 3   Inquires about the COVID-19 vaccine and I have recommended he receive it.  Meds ordered this encounter  Medications  . gabapentin (NEURONTIN) 300 MG capsule    Sig: Take 2 capsules (600 mg total) by mouth 2 (two) times daily.    Dispense:  120 capsule    Refill:  3  . traMADol (ULTRAM) 50 MG tablet    Sig: Take 1 tablet (50 mg total) by mouth at bedtime for 5 days.    Dispense:  30 tablet    Refill:  0  . ketorolac (TORADOL) injection 60 mg    Follow-up: Return for medical conditions, keep previously scheduled appointment.       Charlott Rakes, MD, FAAFP. [facility] and [place], [place]   04/27/2020, 12:16 PM

## 2020-05-04 LAB — DRUG SCREEN 12+ALCOHOL+CRT, UR
Amphetamines, Urine: NEGATIVE ng/mL
BENZODIAZ UR QL: NEGATIVE ng/mL
Barbiturate: NEGATIVE ng/mL
Cannabinoids: POSITIVE — AB
Cocaine (Metabolite): NEGATIVE ng/mL
Creatinine, Urine: 237.2 mg/dL (ref 20.0–300.0)
Ethanol, Urine: NEGATIVE %
Meperidine: NEGATIVE ng/mL
Methadone: NEGATIVE ng/mL
OPIATE SCREEN URINE: NEGATIVE ng/mL
Oxycodone/Oxymorphone, Urine: NEGATIVE ng/mL
Phencyclidine: NEGATIVE ng/mL
Propoxyphene: NEGATIVE ng/mL
Tramadol: NEGATIVE ng/mL

## 2020-05-05 MED FILL — MELOXICAM 7.5 MG TABLET: 7.5 | 30 days supply | Qty: 30 | Fill #2

## 2020-05-05 MED FILL — ATORVASTATIN CALCIUM 20 MG: 20 | 30 days supply | Qty: 30 | Fill #2

## 2020-05-06 ENCOUNTER — Telehealth: Payer: Self-pay

## 2020-05-06 NOTE — Telephone Encounter (Signed)
Patient was called and a voicemail was left informing patient to return phone call for lab results. 

## 2020-05-06 NOTE — Telephone Encounter (Signed)
-----   Message from Charlott Rakes, MD sent at 05/04/2020  1:36 PM EDT ----- Urine drug screen is positive for marijuana.  He had denied using any substances at his last visit.  Going forward he will be unable to receive tramadol refills and will need to remain on his other medications.

## 2020-05-13 ENCOUNTER — Telehealth: Payer: Self-pay

## 2020-05-13 NOTE — Telephone Encounter (Signed)
Patient was called and a voicemail was left informing patient to return phone call for lab results. 

## 2020-05-13 NOTE — Telephone Encounter (Signed)
-----   Message from Charlott Rakes, MD sent at 05/04/2020  1:36 PM EDT ----- Urine drug screen is positive for marijuana.  He had denied using any substances at his last visit.  Going forward he will be unable to receive tramadol refills and will need to remain on his other medications.

## 2020-06-02 ENCOUNTER — Other Ambulatory Visit: Payer: Self-pay

## 2020-06-02 ENCOUNTER — Ambulatory Visit (HOSPITAL_BASED_OUTPATIENT_CLINIC_OR_DEPARTMENT_OTHER): Payer: Self-pay | Admitting: Pharmacist

## 2020-06-02 ENCOUNTER — Other Ambulatory Visit: Payer: Self-pay | Admitting: Family Medicine

## 2020-06-02 ENCOUNTER — Ambulatory Visit: Payer: Self-pay | Attending: Family Medicine | Admitting: Family Medicine

## 2020-06-02 VITALS — BP 138/82 | HR 76 | Ht 73.0 in | Wt 350.0 lb

## 2020-06-02 DIAGNOSIS — L659 Nonscarring hair loss, unspecified: Secondary | ICD-10-CM

## 2020-06-02 DIAGNOSIS — N528 Other male erectile dysfunction: Secondary | ICD-10-CM

## 2020-06-02 DIAGNOSIS — E1169 Type 2 diabetes mellitus with other specified complication: Secondary | ICD-10-CM

## 2020-06-02 DIAGNOSIS — K2100 Gastro-esophageal reflux disease with esophagitis, without bleeding: Secondary | ICD-10-CM

## 2020-06-02 DIAGNOSIS — M7502 Adhesive capsulitis of left shoulder: Secondary | ICD-10-CM

## 2020-06-02 DIAGNOSIS — M25561 Pain in right knee: Secondary | ICD-10-CM

## 2020-06-02 DIAGNOSIS — Z794 Long term (current) use of insulin: Secondary | ICD-10-CM

## 2020-06-02 DIAGNOSIS — Z1159 Encounter for screening for other viral diseases: Secondary | ICD-10-CM

## 2020-06-02 DIAGNOSIS — I10 Essential (primary) hypertension: Secondary | ICD-10-CM

## 2020-06-02 DIAGNOSIS — G8929 Other chronic pain: Secondary | ICD-10-CM

## 2020-06-02 DIAGNOSIS — Z7189 Other specified counseling: Secondary | ICD-10-CM

## 2020-06-02 DIAGNOSIS — R109 Unspecified abdominal pain: Secondary | ICD-10-CM

## 2020-06-02 LAB — POCT URINALYSIS DIP (CLINITEK)
Bilirubin, UA: NEGATIVE
Blood, UA: NEGATIVE
Glucose, UA: NEGATIVE mg/dL
Ketones, POC UA: NEGATIVE mg/dL
Leukocytes, UA: NEGATIVE
Nitrite, UA: NEGATIVE
POC PROTEIN,UA: NEGATIVE
Spec Grav, UA: 1.025 (ref 1.010–1.025)
Urobilinogen, UA: 0.2 E.U./dL
pH, UA: 6 (ref 5.0–8.0)

## 2020-06-02 LAB — POCT GLYCOSYLATED HEMOGLOBIN (HGB A1C): HbA1c, POC (controlled diabetic range): 5.7 % (ref 0.0–7.0)

## 2020-06-02 LAB — GLUCOSE, POCT (MANUAL RESULT ENTRY): POC Glucose: 94 mg/dl (ref 70–99)

## 2020-06-02 MED ORDER — TRULICITY 0.75 MG/0.5ML ~~LOC~~ SOAJ: 0.7500 mg | SUBCUTANEOUS | 6 refills | Status: DC

## 2020-06-02 MED ORDER — LANTUS SOLOSTAR 100 UNIT/ML ~~LOC~~ SOPN: 40.0000 [IU] | PEN_INJECTOR | Freq: Every day | SUBCUTANEOUS | Status: DC

## 2020-06-02 MED ORDER — ATORVASTATIN CALCIUM 20 MG PO TABS: 20.0000 mg | ORAL_TABLET | Freq: Every day | ORAL | 6 refills | Status: DC

## 2020-06-02 MED ORDER — MELOXICAM 7.5 MG PO TABS: 7.5000 mg | ORAL_TABLET | Freq: Every day | ORAL | 3 refills | Status: DC

## 2020-06-02 MED ORDER — SILDENAFIL CITRATE 50 MG PO TABS: 50.0000 mg | ORAL_TABLET | Freq: Every day | ORAL | 3 refills | Status: DC | PRN

## 2020-06-02 MED ORDER — PANTOPRAZOLE SODIUM 40 MG PO TBEC: 40.0000 mg | DELAYED_RELEASE_TABLET | Freq: Every day | ORAL | 3 refills | Status: DC

## 2020-06-02 MED ORDER — CYCLOBENZAPRINE HCL 10 MG PO TABS: 10.0000 mg | ORAL_TABLET | Freq: Every day | ORAL | 3 refills | Status: DC

## 2020-06-02 MED FILL — PANTOPRAZOLE SOD DR 40 MG T: 40 | 30 days supply | Qty: 30 | Fill #0

## 2020-06-02 MED FILL — ATORVASTATIN CALCIUM 20 MG: 20 | 30 days supply | Qty: 30 | Fill #0

## 2020-06-02 MED FILL — !TRULICITY 0.75 MG/0.5 ML P: 0.75 | 28 days supply | Qty: 2 | Fill #0

## 2020-06-02 MED FILL — MELOXICAM 7.5 MG TABLET: 7.5 | 30 days supply | Qty: 30 | Fill #0

## 2020-06-02 MED FILL — CYCLOBENZAPRINE 10 MG TAB: 10 | 30 days supply | Qty: 30 | Fill #0

## 2020-06-02 MED FILL — SILDENAFIL CITRATE 50 MG TA: 50 | 30 days supply | Qty: 10 | Fill #0

## 2020-06-02 MED FILL — GABAPENTIN 300 MG CAPSULE: 300 | 30 days supply | Qty: 120 | Fill #0

## 2020-06-02 MED FILL — $LANTUS SOLOSTAR 100 UNITS/: 100 | 37 days supply | Qty: 15 | Fill #0

## 2020-06-02 NOTE — Patient Instructions (Signed)
Dulaglutide injection What is this medicine? DULAGLUTIDE (DOO la GLOO tide) is used to improve blood sugar control in adults with type 2 diabetes. This medicine may be used with other oral diabetes medicines. This drug may also reduce the risk of heart attack or stroke if you have type 2 diabetes and risk factors for heart disease. This medicine may be used for other purposes; ask your health care provider or pharmacist if you have questions. COMMON BRAND NAME(S): Trulicity What should I tell my health care provider before I take this medicine? They need to know if you have any of these conditions:  endocrine tumors (MEN 2) or if someone in your family had these tumors  eye disease, vision problems  history of pancreatitis  kidney disease  liver disease  stomach problems  thyroid cancer or if someone in your family had thyroid cancer  an unusual or allergic reaction to dulaglutide, other medicines, foods, dyes, or preservatives  pregnant or trying to get pregnant  breast-feeding How should I use this medicine? This medicine is for injection under the skin of your upper leg (thigh), stomach area, or upper arm. It is usually given once every week (every 7 days). You will be taught how to prepare and give this medicine. Use exactly as directed. Take your medicine at regular intervals. Do not take it more often than directed. If you use this medicine with insulin, you should inject this medicine and the insulin separately. Do not mix them together. Do not give the injections right next to each other. Change (rotate) injection sites with each injection. It is important that you put your used needles and syringes in a special sharps container. Do not put them in a trash can. If you do not have a sharps container, call your pharmacist or healthcare provider to get one. A special MedGuide will be given to you by the pharmacist with each prescription and refill. Be sure to read this information  carefully each time. This drug comes with INSTRUCTIONS FOR USE. Ask your pharmacist for directions on how to use this drug. Read the information carefully. Talk to your pharmacist or health care provider if you have questions. Talk to your pediatrician regarding the use of this medicine in children. Special care may be needed. Overdosage: If you think you have taken too much of this medicine contact a poison control center or emergency room at once. NOTE: This medicine is only for you. Do not share this medicine with others. What if I miss a dose? If you miss a dose, take it as soon as you can within 3 days after the missed dose. Then take your next dose at your regular weekly time. If it has been longer than 3 days after the missed dose, do not take the missed dose. Take the next dose at your regular time. Do not take double or extra doses. If you have questions about a missed dose, contact your health care provider for advice. What may interact with this medicine?  other medicines for diabetes Many medications may cause changes in blood sugar, these include:  alcohol containing beverages  antiviral medicines for HIV or AIDS  aspirin and aspirin-like drugs  certain medicines for blood pressure, heart disease, irregular heart beat  chromium  diuretics  male hormones, such as estrogens or progestins, birth control pills  fenofibrate  gemfibrozil  isoniazid  lanreotide  male hormones or anabolic steroids  MAOIs like Carbex, Eldepryl, Marplan, Nardil, and Parnate  medicines for weight   loss  medicines for allergies, asthma, cold, or cough  medicines for depression, anxiety, or psychotic disturbances  niacin  nicotine  NSAIDs, medicines for pain and inflammation, like ibuprofen or naproxen  octreotide  pasireotide  pentamidine  phenytoin  probenecid  quinolone antibiotics such as ciprofloxacin, levofloxacin, ofloxacin  some herbal dietary  supplements  steroid medicines such as prednisone or cortisone  sulfamethoxazole; trimethoprim  thyroid hormones Some medications can hide the warning symptoms of low blood sugar (hypoglycemia). You may need to monitor your blood sugar more closely if you are taking one of these medications. These include:  beta-blockers, often used for high blood pressure or heart problems (examples include atenolol, metoprolol, propranolol)  clonidine  guanethidine  reserpine This list may not describe all possible interactions. Give your health care provider a list of all the medicines, herbs, non-prescription drugs, or dietary supplements you use. Also tell them if you smoke, drink alcohol, or use illegal drugs. Some items may interact with your medicine. What should I watch for while using this medicine? Visit your doctor or health care professional for regular checks on your progress. Drink plenty of fluids while taking this medicine. Check with your doctor or health care professional if you get an attack of severe diarrhea, nausea, and vomiting. The loss of too much body fluid can make it dangerous for you to take this medicine. A test called the HbA1C (A1C) will be monitored. This is a simple blood test. It measures your blood sugar control over the last 2 to 3 months. You will receive this test every 3 to 6 months. Learn how to check your blood sugar. Learn the symptoms of low and high blood sugar and how to manage them. Always carry a quick-source of sugar with you in case you have symptoms of low blood sugar. Examples include hard sugar candy or glucose tablets. Make sure others know that you can choke if you eat or drink when you develop serious symptoms of low blood sugar, such as seizures or unconsciousness. They must get medical help at once. Tell your doctor or health care professional if you have high blood sugar. You might need to change the dose of your medicine. If you are sick or  exercising more than usual, you might need to change the dose of your medicine. Do not skip meals. Ask your doctor or health care professional if you should avoid alcohol. Many nonprescription cough and cold products contain sugar or alcohol. These can affect blood sugar. Pens should never be shared. Even if the needle is changed, sharing may result in passing of viruses like hepatitis or HIV. Wear a medical ID bracelet or chain, and carry a card that describes your disease and details of your medicine and dosage times. What side effects may I notice from receiving this medicine? Side effects that you should report to your doctor or health care professional as soon as possible:  allergic reactions like skin rash, itching or hives, swelling of the face, lips, or tongue  breathing problems  changes in vision  diarrhea that continues or is severe  lump or swelling on the neck  severe nausea  signs and symptoms of infection like fever or chills; cough; sore throat; pain or trouble passing urine  signs and symptoms of low blood sugar such as feeling anxious, confusion, dizziness, increased hunger, unusually weak or tired, sweating, shakiness, cold, irritable, headache, blurred vision, fast heartbeat, loss of consciousness  signs and symptoms of kidney injury like trouble passing   urine or change in the amount of urine  trouble swallowing  unusual stomach upset or pain  vomiting Side effects that usually do not require medical attention (report to your doctor or health care professional if they continue or are bothersome):  diarrhea  loss of appetite  nausea  pain, redness, or irritation at site where injected  stomach upset This list may not describe all possible side effects. Call your doctor for medical advice about side effects. You may report side effects to FDA at 1-800-FDA-1088. Where should I keep my medicine? Keep out of the reach of children. Store unopened pens in a  refrigerator between 2 and 8 degrees C (36 and 46 degrees F). Do not freeze or use if the medicine has been frozen. Protect from light and excessive heat. Store in the carton until use. Each single-dose pen can be kept at room temperature, not to exceed 30 degrees C (86 degrees F) for a total of 14 days, if needed. Throw away any unused medicine after the expiration date on the label. NOTE: This sheet is a summary. It may not cover all possible information. If you have questions about this medicine, talk to your doctor, pharmacist, or health care provider.  2020 Elsevier/Gold Standard (2019-04-07 09:34:53)  

## 2020-06-02 NOTE — Progress Notes (Signed)
States that he is having pain in his kidney area.  States that knee is better.  Hair is thinning.

## 2020-06-02 NOTE — Progress Notes (Signed)
Patient was educated on the use of the Trulicity pen. Reviewed necessary supplies and operation of the pen. Also reviewed goal blood glucose levels. Patient was able to demonstrate use. All questions and concerns were addressed.  Benard Halsted, PharmD, [place] [phone]

## 2020-06-02 NOTE — Progress Notes (Signed)
Subjective:  Patient ID: Victor Hale, male    DOB: Jan 30, 1976  Age: 44 y.o. MRN: [id number]  CC: Diabetes   HPI Victor Hale is a 44 year old male with a history of hypertension, type 2 diabetes mellitus (A1c 5.7), obesity, chronic knee pain who presents today for follow-up visit. His A1c is 5.7 down from 6.2 and he endorses compliance with his medications and denies presence of hypoglycemia.  He exercises regularly.  He also denies visual concerns or neuropathy.  He has noticed diarrhea with his Metformin.  His knee is doing better than it has ever been and he is on meloxicam chronically for knee pain.  Hair is thining out and he is unsure of duration but this is concerning to him.  He has not changed his hair products and has noticed receding as well in his anterior hairline as well as thinning in his central areas as noticed by his barber. Requests a refill of Viagra.  Complains of heart burn despite not eating late and he sometimes has a sour taste in his mouth.  Also complains of bilateral flank pain and is wondering if something is wrong with his kidneys.  Denies dysuria or other urinary symptoms  Past Medical History:  Diagnosis Date  . Diabetes mellitus without complication ([facility])   . Fatty liver   . Hypertension     No past surgical history on file.  Family History  Problem Relation Age of Onset  . Hypertension Mother   . Diabetes Mother     Allergies  Allergen Reactions  . Penicillins Itching    Outpatient Medications Prior to Visit  Medication Sig Dispense Refill  . atorvastatin (LIPITOR) 20 MG tablet Take 1 tablet (20 mg total) by mouth daily. 30 tablet 6  . Blood Glucose Monitoring Suppl (TRUE METRIX METER) DEVI 1 each by Does not apply route daily before breakfast. 100 each 12  . cyclobenzaprine (FLEXERIL) 10 MG tablet Take 1 tablet (10 mg total) by mouth at bedtime. 30 tablet 3  . gabapentin (NEURONTIN) 300 MG capsule Take 2 capsules (600 mg total) by  mouth 2 (two) times daily. 120 capsule 3  . glucose blood (TRUE METRIX BLOOD GLUCOSE TEST) test strip Use daily before breakfast 100 each 12  . insulin glargine (LANTUS SOLOSTAR) 100 UNIT/ML Solostar Pen Inject 43 Units into the skin daily. Patient may increase by 2 units every 3 days until fasting sugar is < 130. Max 45 units. 5 pen 3  . Insulin Pen Needle 31G X 5 MM MISC 1 each by Does not apply route at bedtime. 30 each 5  . lidocaine (XYLOCAINE) 2 % jelly Apply topically.    . meloxicam (MOBIC) 7.5 MG tablet Take 1 tablet (7.5 mg total) by mouth daily. 30 tablet 3  . predniSONE (DELTASONE) 20 MG tablet Take 1 tablet (20 mg total) by mouth daily with breakfast. Complete prednisone prior to initiating meloxicam 5 tablet 0  . sildenafil (VIAGRA) 50 MG tablet Take 1 tablet (50 mg total) by mouth daily as needed for erectile dysfunction. At least 24 hours between doses 10 tablet 1  . TRUEplus Lancets 28G MISC 1 each by Does not apply route 3 (three) times daily before meals. 100 each 12  . lisinopril-hydrochlorothiazide (ZESTORETIC) 20-25 MG tablet Take 1 tablet by mouth daily. 30 tablet 3  . metFORMIN (GLUCOPHAGE) 500 MG tablet Take 2 tablets (1,000 mg total) by mouth 2 (two) times daily with a meal. 120 tablet 6   No facility-administered  medications prior to visit.     ROS Review of Systems  Constitutional: Negative for activity change and appetite change.  HENT: Negative for sinus pressure and sore throat.   Eyes: Negative for visual disturbance.  Respiratory: Negative for cough, chest tightness and shortness of breath.   Cardiovascular: Negative for chest pain and leg swelling.  Gastrointestinal: Negative for abdominal distention, abdominal pain, constipation and diarrhea.  Endocrine: Negative.   Genitourinary: Positive for flank pain. Negative for dysuria.  Musculoskeletal: Negative for joint swelling and myalgias.  Skin: Negative for rash.  Allergic/Immunologic: Negative.     Neurological: Negative for weakness, light-headedness and numbness.  Psychiatric/Behavioral: Negative for dysphoric mood and suicidal ideas.    Objective:  BP 138/82   Pulse 76   Ht 6\' 1"  (1.854 m)   Wt (!) 350 lb (158.8 kg)   SpO2 97%   BMI 46.18 kg/m   BP/Weight 06/02/2020 04/27/2020 9/32/6712  Systolic BP 458 099 833  Diastolic BP 82 71 90  Wt. (Lbs) 350 347 347  BMI 46.18 45.78 45.78      Physical Exam Constitutional:      Appearance: He is well-developed.  Neck:     Vascular: No JVD.  Cardiovascular:     Rate and Rhythm: Normal rate.     Heart sounds: Normal heart sounds. No murmur heard.   Pulmonary:     Effort: Pulmonary effort is normal.     Breath sounds: Normal breath sounds. No wheezing or rales.  Chest:     Chest wall: No tenderness.  Abdominal:     General: Bowel sounds are normal. There is no distension.     Palpations: Abdomen is soft. There is no mass.     Tenderness: There is no abdominal tenderness. There is left CVA tenderness.  Musculoskeletal:        General: Normal range of motion.     Right lower leg: No edema.     Left lower leg: No edema.  Neurological:     Mental Status: He is alert and oriented to person, place, and time.  Psychiatric:        Mood and Affect: Mood normal.     CMP Latest Ref Rng & Units 10/26/2019 08/27/2019 06/23/2019  Glucose 65 - 99 mg/dL 472(H) 197(H) 116(H)  BUN 6 - 24 mg/dL 11 15 9   Creatinine 0.76 - 1.27 mg/dL 0.97 0.80 0.84  Sodium 134 - 144 mmol/L 139 138 141  Potassium 3.5 - 5.2 mmol/L 4.7 4.2 4.3  Chloride 96 - 106 mmol/L 103 102 104  CO2 20 - 29 mmol/L 10(L) 21 19(L)  Calcium 8.7 - 10.2 mg/dL 9.3 10.0 9.7  Total Protein 6.0 - 8.5 g/dL - - 7.6  Total Bilirubin 0.0 - 1.2 mg/dL - - 0.4  Alkaline Phos 39 - 117 IU/L - - 55  AST 0 - 40 IU/L - - 53(H)  ALT 0 - 44 IU/L - - 73(H)    Lipid Panel     Component Value Date/Time   CHOL 152 12/02/2019 0926   TRIG 103 12/02/2019 0926   HDL 50 12/02/2019 0926    CHOLHDL 3.0 12/02/2019 0926   LDLCALC 83 12/02/2019 0926    CBC    Component Value Date/Time   WBC 12.8 (H) 01/31/2019 0536   RBC 4.56 01/31/2019 0536   HGB 14.3 01/31/2019 0536   HGB 14.6 06/20/2014 1422   HCT 42.4 01/31/2019 0536   HCT 43.0 06/20/2014 1422   PLT 287 01/31/2019  0536   PLT 251 06/20/2014 1422   MCV 93.0 01/31/2019 0536   MCV 94 06/20/2014 1422   MCH 31.4 01/31/2019 0536   MCHC 33.7 01/31/2019 0536   RDW 12.4 01/31/2019 0536   RDW 12.9 06/20/2014 1422   LYMPHSABS 2.1 09/17/2016 1218   LYMPHSABS 2.0 06/20/2014 1422   MONOABS 1.6 (H) 09/17/2016 1218   MONOABS 0.7 06/20/2014 1422   EOSABS 0.0 09/17/2016 1218   EOSABS 0.2 06/20/2014 1422   BASOSABS 0.0 09/17/2016 1218   BASOSABS 0.1 06/20/2014 1422    Lab Results  Component Value Date   HGBA1C 5.7 06/02/2020    Assessment & Plan:  1. Type 2 diabetes mellitus with other specified complication, with long-term current use of insulin (HCC) Controlled with A1c of 5.7; goal is less than 7.0 Commended on significant improvement Discontinue Metformin due to GI adverse effects Initiate Trulicity which will be beneficial with regards to weight loss and cardiovascular disease Reduce Lantus from 43 units to 40 units.  If A1c remains at this level we can consider titrating down Lantus at his next visit - POCT glucose (manual entry) - POCT glycosylated hemoglobin (Hb A1C) - Dulaglutide (TRULICITY) 4.78 GN/5.6OZ SOPN; Inject 0.75 mg into the skin once a week. For one month then 1.5mg  daily  Dispense: 2 mL; Refill: 6 - Basic Metabolic Panel - atorvastatin (LIPITOR) 20 MG tablet; Take 1 tablet (20 mg total) by mouth daily.  Dispense: 30 tablet; Refill: 6 - insulin glargine (LANTUS SOLOSTAR) 100 UNIT/ML Solostar Pen; Inject 40 Units into the skin daily.  Dispense: 30 mL; Refill: ml  2. Chronic pain of right knee Improved significantly Weight loss will be beneficial - meloxicam (MOBIC) 7.5 MG tablet; Take 1 tablet  (7.5 mg total) by mouth daily.  Dispense: 30 tablet; Refill: 3  3. Adhesive capsulitis of left shoulder Stable - cyclobenzaprine (FLEXERIL) 10 MG tablet; Take 1 tablet (10 mg total) by mouth at bedtime.  Dispense: 30 tablet; Refill: 3  4. Thinning hair We will check testosterone levels Advised that this could be age-related We will also check thyroid - Testosterone,Free and Total  5. Flank pain Suspicious for muscle spasm UA negative for UTI - POCT URINALYSIS DIP (CLINITEK)  6. Essential hypertension Diet controlled Counseled on blood pressure goal of less than 130/80, low-sodium, DASH diet, medication compliance, 150 minutes of moderate intensity exercise per week. Discussed medication compliance, adverse effects.   7. Need for hepatitis C screening test - HCV RNA quant rflx ultra or genotyp([facility])  8. Gastroesophageal reflux disease with esophagitis without hemorrhage Advised to avoid recumbency up to 2 hours post meals Placed on PPI - pantoprazole (PROTONIX) 40 MG tablet; Take 1 tablet (40 mg total) by mouth daily.  Dispense: 30 tablet; Refill: 3  9. Other male erectile dysfunction Refill Viagra   Return in about 3 months (around 09/02/2020) for Chronic disease management.       Charlott Rakes, MD, FAAFP. [facility] and [place], [place]   06/02/2020, 8:59 AM

## 2020-06-03 ENCOUNTER — Encounter: Payer: Self-pay | Admitting: Family Medicine

## 2020-06-04 LAB — BASIC METABOLIC PANEL
BUN/Creatinine Ratio: 11 (ref 9–20)
BUN: 10 mg/dL (ref 6–24)
CO2: 23 mmol/L (ref 20–29)
Calcium: 9.3 mg/dL (ref 8.7–10.2)
Chloride: 104 mmol/L (ref 96–106)
Creatinine, Ser: 0.9 mg/dL (ref 0.76–1.27)
GFR calc Af Amer: 120 mL/min/{1.73_m2} (ref >59)
GFR calc non Af Amer: 104 mL/min/{1.73_m2} (ref >59)
Glucose: 85 mg/dL (ref 65–99)
Potassium: 4.2 mmol/L (ref 3.5–5.2)
Sodium: 141 mmol/L (ref 134–144)

## 2020-06-04 LAB — HCV RNA QUANT RFLX ULTRA OR GENOTYP: HCV Quant Baseline: NOT DETECTED IU/mL

## 2020-06-04 LAB — TESTOSTERONE,FREE AND TOTAL
Testosterone, Free: 8.1 pg/mL (ref 6.8–21.5)
Testosterone: 305 ng/dL (ref 264–916)

## 2020-06-07 LAB — TSH: TSH: 2.05 u[IU]/mL (ref 0.450–4.500)

## 2020-06-07 LAB — SPECIMEN STATUS REPORT

## 2020-06-17 ENCOUNTER — Telehealth: Payer: Self-pay

## 2020-06-17 NOTE — Telephone Encounter (Signed)
Patient name and DOB has been verified Patient was informed of lab results. Patient had no questions.  

## 2020-06-17 NOTE — Telephone Encounter (Signed)
-----   Message from Charlott Rakes, MD sent at 06/07/2020 12:59 PM EDT ----- Thyroid labs, testosterone and other labs are normal.

## 2020-07-04 ENCOUNTER — Encounter: Payer: Self-pay | Admitting: Pharmacist

## 2020-07-04 ENCOUNTER — Other Ambulatory Visit: Payer: Self-pay

## 2020-07-04 ENCOUNTER — Other Ambulatory Visit: Payer: Self-pay | Admitting: Family Medicine

## 2020-07-04 ENCOUNTER — Ambulatory Visit: Payer: Self-pay | Attending: Family Medicine | Admitting: Pharmacist

## 2020-07-04 DIAGNOSIS — E1169 Type 2 diabetes mellitus with other specified complication: Secondary | ICD-10-CM

## 2020-07-04 DIAGNOSIS — Z794 Long term (current) use of insulin: Secondary | ICD-10-CM

## 2020-07-04 LAB — GLUCOSE, POCT (MANUAL RESULT ENTRY): POC Glucose: 125 mg/dl — AB (ref 70–99)

## 2020-07-04 MED ORDER — TRULICITY 1.5 MG/0.5ML ~~LOC~~ SOAJ: 1.5000 mg | SUBCUTANEOUS | 3 refills | Status: DC

## 2020-07-04 MED FILL — SILDENAFIL CITRATE 50 MG TA: 50 | 30 days supply | Qty: 10 | Fill #1

## 2020-07-04 MED FILL — PANTOPRAZOLE SOD DR 40 MG T: 40 | 30 days supply | Qty: 30 | Fill #1

## 2020-07-04 MED FILL — ?TRULICITY 1.5 MG/0.5 ML PE: 1.5 | 28 days supply | Qty: 2 | Fill #0

## 2020-07-04 NOTE — Progress Notes (Signed)
    S:     No chief complaint on file.  Patient arrives in good spirits. Presents for diabetes evaluation, education, and management Patient was referred and last seen by Primary Care Provider on 06/02/2020.  At that visit, metformin was d/c'd d/t side effects. Trulicity was started. Today, pt endorses medication compliance with Trulicity. He reports that he stopped Lantus after starting Trulicity.    Family/Social History:  - FHx: HTN, DM  - Tobacco: never smoker  - Alcohol: denies use   Insurance coverage/medication affordability: self-pay  Medication adherence denied. Stopped insulin shortly after seeing Dr. Margarita Rana ~1 month ago. Compliant with Trulicity.    Current diabetes medications include: Trulicity 7.62 mg weekly, Lantus 40 units daily (not taking)  Patient denies hypoglycemic events.  Patient reported dietary habits:  - Admits to some dietary indiscretion over the Thanksgiving holiday  - Endorses good diabetic diet for the most part.   Patient-reported exercise habits:  - Works out daily; trying to build muscle    Patient reports polyuria.  Patient denies neuropathy (nerve pain). Patient reports visual changes. Patient reports self foot exams.     O:  POCT: 125  Lab Results  Component Value Date   HGBA1C 5.7 06/02/2020   There were no vitals filed for this visit.  Lipid Panel     Component Value Date/Time   CHOL 152 12/02/2019 0926   TRIG 103 12/02/2019 0926   HDL 50 12/02/2019 0926   CHOLHDL 3.0 12/02/2019 0926   LDLCALC 83 12/02/2019 0926    Home fasting blood sugars: not checking   2 hour post-meal/random blood sugars: not checking .   Clinical Atherosclerotic Cardiovascular Disease (ASCVD): No  The 10-year ASCVD risk score Mikey Bussing DC Jr., et al., 2013) is: 7.7%   Values used to calculate the score:     Age: 44 years     Sex: Male     Is Non-Hispanic African American: Yes     Diabetic: Yes     Tobacco smoker: No     Systolic Blood Pressure:  138 mmHg     Is BP treated: No     HDL Cholesterol: 50 mg/dL     Total Cholesterol: 152 mg/dL    A/P: Diabetes longstanding currently controlled. Patient is able to verbalize appropriate hypoglycemia management plan. Medication adherence reported with Trulicity. He verbalized multiple times that he has not taken Lantus in several weeks. Will increase Trulicity to 1.5 weekly and have him continue to hold Lantus. Advised him to contact me (number given) if home blood sugars increase without Lantus.  -Increased dose of Trulicity to 1.5 mg weekly.  -Hold Lantus for now.  -Extensively discussed pathophysiology of diabetes, recommended lifestyle interventions, dietary effects on blood sugar control -Counseled on s/sx of and management of hypoglycemia -Next A1C anticipated 11/2020.   Written patient instructions provided.  Total time in face to face counseling 30 minutes.   Follow up Pharmacist Clinic Visit in 1 month.    Benard Halsted, PharmD, [place] [phone]

## 2020-07-14 MED FILL — MELOXICAM 7.5 MG TABLET: 7.5 | 30 days supply | Qty: 30 | Fill #1

## 2020-07-14 MED FILL — ATORVASTATIN CALCIUM 20 MG: 20 | 30 days supply | Qty: 30 | Fill #1

## 2020-07-25 MED FILL — GABAPENTIN 300 MG CAPSULE: 300 | 30 days supply | Qty: 120 | Fill #1

## 2020-07-28 MED FILL — ?TRULICITY 1.5 MG/0.5 ML PE: 1.5 | 28 days supply | Qty: 2 | Fill #1

## 2020-07-28 MED FILL — SILDENAFIL CITRATE 50 MG TA: 50 | 30 days supply | Qty: 10 | Fill #2

## 2020-08-09 ENCOUNTER — Encounter: Payer: Self-pay | Admitting: Pharmacist

## 2020-08-09 ENCOUNTER — Other Ambulatory Visit: Payer: Self-pay

## 2020-08-09 ENCOUNTER — Ambulatory Visit: Payer: Self-pay | Attending: Family Medicine | Admitting: Pharmacist

## 2020-08-09 DIAGNOSIS — E119 Type 2 diabetes mellitus without complications: Secondary | ICD-10-CM

## 2020-08-09 DIAGNOSIS — Z23 Encounter for immunization: Secondary | ICD-10-CM

## 2020-08-09 LAB — GLUCOSE, POCT (MANUAL RESULT ENTRY): POC Glucose: 144 mg/dl — AB (ref 70–99)

## 2020-08-09 MED ORDER — TRUE METRIX BLOOD GLUCOSE TEST VI STRP: ORAL_STRIP | 12 refills | Status: AC

## 2020-08-09 NOTE — Progress Notes (Signed)
    S:     No chief complaint on file.  Patient arrives in good spirits. Presents for diabetes evaluation, education, and management. Patient was referred and last seen by Primary Care Provider on 06/02/2020.  At that visit, metformin was d/c'd d/t side effects. Trulicity was started. Patient was last seen by clinical pharmacist on 07/04/20. At this visit, Trulicity was increased to 1.5 mg weekly dose. Insulin was also discontinued as pt reported not taking this for several weeks leading up to that visit.   Today, patient reports great adherence and tolerability to Trulicity.     Family/Social History:  - FHx: HTN, DM  - Tobacco: never smoker  - Alcohol: denies use   Insurance coverage/medication affordability: self-pay  Medication adherence appears appropriate.    Current diabetes medications include: Trulicity 1.5 mg once weekly (injection day: Tuesdays)  Patient denies hypoglycemic events.  Patient reported dietary habits:  - Admits to some dietary indiscretion over the Thanksgiving holiday  - Endorses good diabetic diet for the most part.   Patient-reported exercise habits:  - Works out daily; trying to build muscle    Patient reports polyuria.  Patient reports neuropathy (nerve pain). Patient reports visual changes. Patient reports self foot exams.     O:  POCT: 144  Lab Results  Component Value Date   HGBA1C 5.7 06/02/2020   There were no vitals filed for this visit.  Lipid Panel     Component Value Date/Time   CHOL 152 12/02/2019 0926   TRIG 103 12/02/2019 0926   HDL 50 12/02/2019 0926   CHOLHDL 3.0 12/02/2019 0926   LDLCALC 83 12/02/2019 0926    Home fasting blood sugars: not checking   2 hour post-meal/random blood sugars: not checking .   Clinical Atherosclerotic Cardiovascular Disease (ASCVD): No  The 10-year ASCVD risk score Denman George DC Jr., et al., 2013) is: 7.7%   Values used to calculate the score:     Age: 45 years     Sex: Male     Is  Non-Hispanic African American: Yes     Diabetic: Yes     Tobacco smoker: No     Systolic Blood Pressure: 138 mmHg     Is BP treated: No     HDL Cholesterol: 50 mg/dL     Total Cholesterol: 152 mg/dL    A/P: Diabetes longstanding currently controlled. Patient is able to verbalize appropriate hypoglycemia management plan. Medication adherence reported with Trulicity. Although blood sugar today is slightly above goal, unable to make recommendations based on lack of home blood sugars. Will continue current regimen today. Advised patient to start taking blood sugar each day over the next week, and we will schedule a telephone visit in 1 week to discuss those readings.  -Continued Trulicity 1.5 mg once weekly  -Extensively discussed pathophysiology of diabetes, recommended lifestyle interventions, dietary effects on blood sugar control -Counseled on s/sx of and management of hypoglycemia -Next A1C anticipated 11/2020.   Written patient instructions provided.  Total time in face to face counseling 15 minutes.   Follow up Pharmacist Telephone visit in 1 week.  Theodis Sato, PharmD PGY-1 [facility] Resident   08/09/2020 12:25 PM

## 2020-08-10 LAB — MICROALBUMIN / CREATININE URINE RATIO
Creatinine, Urine: 203.2 mg/dL
Microalb/Creat Ratio: 10 mg/g creat (ref 0–29)
Microalbumin, Urine: 21.2 ug/mL

## 2020-08-11 ENCOUNTER — Telehealth: Payer: Self-pay

## 2020-08-11 NOTE — Telephone Encounter (Signed)
Pt was called and a VM was left informing patient that his results were normal.

## 2020-08-11 NOTE — Telephone Encounter (Signed)
-----   Message from Hoy Register, MD sent at 08/11/2020  2:21 PM EST ----- Urine test is normal

## 2020-08-16 ENCOUNTER — Ambulatory Visit: Payer: Self-pay | Admitting: Pharmacist

## 2020-08-17 MED FILL — PANTOPRAZOLE SOD DR 40 MG T: 40 | 30 days supply | Qty: 30 | Fill #2

## 2020-08-17 MED FILL — ?ATORVASTATIN 20 MG TABLET: 20 | 30 days supply | Qty: 30 | Fill #2

## 2020-08-17 MED FILL — MELOXICAM 7.5 MG TABLET: 7.5 | 30 days supply | Qty: 30 | Fill #2

## 2020-09-05 ENCOUNTER — Other Ambulatory Visit: Payer: Self-pay | Admitting: Family Medicine

## 2020-09-05 ENCOUNTER — Encounter: Payer: Self-pay | Admitting: Family Medicine

## 2020-09-05 ENCOUNTER — Ambulatory Visit: Payer: Self-pay | Attending: Family Medicine | Admitting: Family Medicine

## 2020-09-05 ENCOUNTER — Other Ambulatory Visit: Payer: Self-pay

## 2020-09-05 VITALS — BP 145/88 | HR 97 | Ht 73.0 in | Wt 344.0 lb

## 2020-09-05 DIAGNOSIS — Z794 Long term (current) use of insulin: Secondary | ICD-10-CM

## 2020-09-05 DIAGNOSIS — B349 Viral infection, unspecified: Secondary | ICD-10-CM

## 2020-09-05 DIAGNOSIS — M7502 Adhesive capsulitis of left shoulder: Secondary | ICD-10-CM

## 2020-09-05 DIAGNOSIS — R109 Unspecified abdominal pain: Secondary | ICD-10-CM

## 2020-09-05 DIAGNOSIS — E1169 Type 2 diabetes mellitus with other specified complication: Secondary | ICD-10-CM

## 2020-09-05 DIAGNOSIS — R319 Hematuria, unspecified: Secondary | ICD-10-CM

## 2020-09-05 LAB — POCT URINALYSIS DIP (CLINITEK)
Bilirubin, UA: NEGATIVE
Glucose, UA: NEGATIVE mg/dL
Ketones, POC UA: NEGATIVE mg/dL
Leukocytes, UA: NEGATIVE
Nitrite, UA: NEGATIVE
Spec Grav, UA: >=1.03 — AB (ref 1.010–1.025)
Urobilinogen, UA: 0.2 E.U./dL
pH, UA: 6 (ref 5.0–8.0)

## 2020-09-05 LAB — POCT GLYCOSYLATED HEMOGLOBIN (HGB A1C): HbA1c, POC (controlled diabetic range): 6.1 % (ref 0.0–7.0)

## 2020-09-05 LAB — GLUCOSE, POCT (MANUAL RESULT ENTRY): POC Glucose: 195 mg/dl — AB (ref 70–99)

## 2020-09-05 MED ORDER — TRULICITY 1.5 MG/0.5ML ~~LOC~~ SOAJ: 1.5000 mg | SUBCUTANEOUS | 6 refills | Status: DC

## 2020-09-05 MED ORDER — CYCLOBENZAPRINE HCL 10 MG PO TABS: 10.0000 mg | ORAL_TABLET | Freq: Every day | ORAL | 3 refills | Status: DC

## 2020-09-05 MED ORDER — BENZONATATE 100 MG PO CAPS: 100.0000 mg | ORAL_CAPSULE | Freq: Three times a day (TID) | ORAL | 0 refills | Status: DC | PRN

## 2020-09-05 MED ORDER — ALBUTEROL SULFATE HFA 108 (90 BASE) MCG/ACT IN AERS: 2.0000 | INHALATION_SPRAY | Freq: Four times a day (QID) | RESPIRATORY_TRACT | 0 refills | Status: DC | PRN

## 2020-09-05 MED FILL — ALBUTEROL SULFATE HFA 108 (: 108 (90 BAS | 30 days supply | Qty: 18 | Fill #0

## 2020-09-05 MED FILL — BENZONATATE 100 MG CAPS: 100 | 10 days supply | Qty: 30 | Fill #0

## 2020-09-05 MED FILL — !TRULICITY 1.5 MG/0.5 ML PE: 1.5 | 28 days supply | Qty: 2 | Fill #0

## 2020-09-05 MED FILL — CYCLOBENZAPRINE 10 MG TAB: 10 | 30 days supply | Qty: 30 | Fill #0

## 2020-09-05 NOTE — Progress Notes (Signed)
No insulin in 2 weeks. Trouble breathing. States that he has not been feeling right Frequent urination. Having pain in kidney area.

## 2020-09-05 NOTE — Progress Notes (Signed)
Subjective:  Patient ID: Victor Hale, male    DOB: 1975-08-25  Age: 45 y.o. MRN: [id number]  CC: Diabetes   HPI Victor Hale  is a 45 year old male with a history of hypertension, type 2 diabetes mellitus (A1c6.1), obesity, chronic knee pain who presents today for follow-up visit. He has been out of his Trulicity for the last 2 weeks but endorses compliance prior to then.  He has not been taking his Lantus for the last 3 months as his sugars have been controlled.  He complains of ' not feeling well' for the last 5 days.  States he has difficulty breathing but no wheezing and no cough even though I have heard him cough during the encounter and cough has been dry.  He did have a temperature of 100.4 on Wednesday but since then has had no fever.  He is unable to put his symptoms into words.  Denies presence of myalgias or arthralgias but states he does not feel well.  Denies history of sick contacts.  He has received both doses of the COVID vaccine. Also complains of bilateral flank pain but no dysuria or urinary frequency.  Past Medical History:  Diagnosis Date  . Diabetes mellitus without complication ([place])   . Fatty liver   . Hypertension     History reviewed. No pertinent surgical history.  Family History  Problem Relation Age of Onset  . Hypertension Mother   . Diabetes Mother     Allergies  Allergen Reactions  . Penicillins Itching    Outpatient Medications Prior to Visit  Medication Sig Dispense Refill  . atorvastatin (LIPITOR) 20 MG tablet Take 1 tablet (20 mg total) by mouth daily. 30 tablet 6  . Blood Glucose Monitoring Suppl (TRUE METRIX METER) DEVI 1 each by Does not apply route daily before breakfast. 100 each 12  . gabapentin (NEURONTIN) 300 MG capsule Take 2 capsules (600 mg total) by mouth 2 (two) times daily. 120 capsule 3  . glucose blood (TRUE METRIX BLOOD GLUCOSE TEST) test strip Use daily before breakfast 100 each 12  . Insulin Pen Needle 31G X 5 MM MISC  1 each by Does not apply route at bedtime. 30 each 5  . lidocaine (XYLOCAINE) 2 % jelly Apply topically.    . meloxicam (MOBIC) 7.5 MG tablet Take 1 tablet (7.5 mg total) by mouth daily. 30 tablet 3  . pantoprazole (PROTONIX) 40 MG tablet Take 1 tablet (40 mg total) by mouth daily. 30 tablet 3  . sildenafil (VIAGRA) 50 MG tablet Take 1 tablet (50 mg total) by mouth daily as needed for erectile dysfunction. At least 24 hours between doses 10 tablet 3  . TRUEplus Lancets 28G MISC 1 each by Does not apply route 3 (three) times daily before meals. 100 each 12  . cyclobenzaprine (FLEXERIL) 10 MG tablet Take 1 tablet (10 mg total) by mouth at bedtime. 30 tablet 3  . Dulaglutide (TRULICITY) 1.5 0000000 SOPN Inject 1.5 mg into the skin once a week. 2 mL 3  . insulin glargine (LANTUS SOLOSTAR) 100 UNIT/ML Solostar Pen Inject 40 Units into the skin daily. 30 mL ml   No facility-administered medications prior to visit.     ROS Review of Systems  Constitutional: Negative for activity change and appetite change.  HENT: Negative for sinus pressure and sore throat.   Respiratory: Positive for shortness of breath. Negative for chest tightness and wheezing.   Cardiovascular: Negative for chest pain and palpitations.  Gastrointestinal: Negative for  abdominal distention, abdominal pain and constipation.  Genitourinary: Negative.   Musculoskeletal: Negative.   Psychiatric/Behavioral: Negative for behavioral problems and dysphoric mood.    Objective:  BP (!) 145/88   Pulse 97   Ht 6\' 1"  (1.854 m)   Wt (!) 344 lb (156 kg)   SpO2 100%   BMI 45.39 kg/m   BP/Weight 09/05/2020 06/02/2020 1/82/9937  Systolic BP 169 678 938  Diastolic BP 88 82 71  Wt. (Lbs) 344 350 347  BMI 45.39 46.18 45.78      Physical Exam Constitutional:      Appearance: He is well-developed.  Neck:     Vascular: No JVD.  Cardiovascular:     Rate and Rhythm: Normal rate.     Heart sounds: Normal heart sounds. No murmur  heard.   Pulmonary:     Effort: Pulmonary effort is normal.     Breath sounds: Normal breath sounds. No wheezing or rales.  Chest:     Chest wall: No tenderness.  Abdominal:     General: Bowel sounds are normal. There is no distension.     Palpations: Abdomen is soft. There is no mass.     Tenderness: There is no abdominal tenderness. There is right CVA tenderness.  Musculoskeletal:        General: Normal range of motion.     Right lower leg: No edema.     Left lower leg: No edema.  Neurological:     Mental Status: He is alert and oriented to person, place, and time.  Psychiatric:        Mood and Affect: Mood normal.     CMP Latest Ref Rng & Units 06/02/2020 10/26/2019 08/27/2019  Glucose 65 - 99 mg/dL 85 472(H) 197(H)  BUN 6 - 24 mg/dL 10 11 15   Creatinine 0.76 - 1.27 mg/dL 0.90 0.97 0.80  Sodium 134 - 144 mmol/L 141 139 138  Potassium 3.5 - 5.2 mmol/L 4.2 4.7 4.2  Chloride 96 - 106 mmol/L 104 103 102  CO2 20 - 29 mmol/L 23 10(L) 21  Calcium 8.7 - 10.2 mg/dL 9.3 9.3 10.0  Total Protein 6.0 - 8.5 g/dL - - -  Total Bilirubin 0.0 - 1.2 mg/dL - - -  Alkaline Phos 39 - 117 IU/L - - -  AST 0 - 40 IU/L - - -  ALT 0 - 44 IU/L - - -    Lipid Panel     Component Value Date/Time   CHOL 152 12/02/2019 0926   TRIG 103 12/02/2019 0926   HDL 50 12/02/2019 0926   CHOLHDL 3.0 12/02/2019 0926   LDLCALC 83 12/02/2019 0926    CBC    Component Value Date/Time   WBC 12.8 (H) 01/31/2019 0536   RBC 4.56 01/31/2019 0536   HGB 14.3 01/31/2019 0536   HGB 14.6 06/20/2014 1422   HCT 42.4 01/31/2019 0536   HCT 43.0 06/20/2014 1422   PLT 287 01/31/2019 0536   PLT 251 06/20/2014 1422   MCV 93.0 01/31/2019 0536   MCV 94 06/20/2014 1422   MCH 31.4 01/31/2019 0536   MCHC 33.7 01/31/2019 0536   RDW 12.4 01/31/2019 0536   RDW 12.9 06/20/2014 1422   LYMPHSABS 2.1 09/17/2016 1218   LYMPHSABS 2.0 06/20/2014 1422   MONOABS 1.6 (H) 09/17/2016 1218   MONOABS 0.7 06/20/2014 1422   EOSABS 0.0  09/17/2016 1218   EOSABS 0.2 06/20/2014 1422   BASOSABS 0.0 09/17/2016 1218   BASOSABS 0.1 06/20/2014 1422  Lab Results  Component Value Date   HGBA1C 6.1 09/05/2020    Assessment & Plan:  1. Type 2 diabetes mellitus with other specified complication, with long-term current use of insulin (HCC) Controlled with A1c of 6.1 Continue current regimen Discontinue Lantus as he has not been taking it and A1c is controlled Counseled on blood pressure goal of less than 130/80, low-sodium, DASH diet, medication compliance, 150 minutes of moderate intensity exercise per week. Discussed medication compliance, adverse effects. - POCT glycosylated hemoglobin (Hb A1C) - POCT glucose (manual entry) - Dulaglutide (TRULICITY) 1.5 XB/2.8UX SOPN; Inject 1.5 mg into the skin once a week.  Dispense: 2 mL; Refill: 6  2. Viral syndrome Oxygen saturation is normal Will evaluate for SARS-CoV-2 Will prescribe inhalers and if SARS-CoV-2 is positive consider prednisone He will be candidate for monoclonal antibody if he is positive He will need to go to the ED if symptoms persist - Novel Coronavirus, NAA ([facility]) - albuterol (VENTOLIN HFA) 108 (90 Base) MCG/ACT inhaler; Inhale 2 puffs into the lungs every 6 (six) hours as needed for wheezing or shortness of breath.  Dispense: 8 g; Refill: 0 - benzonatate (TESSALON) 100 MG capsule; Take 1 capsule (100 mg total) by mouth 3 (three) times daily as needed for cough.  Dispense: 30 capsule; Refill: 0  3. Flank pain UA is negative for UTI but reveals presence of hematuria Prescribed Flexeril which she will combined with meloxicam - POCT URINALYSIS DIP (CLINITEK) - CT RENAL STONE STUDY; Future  4. Adhesive capsulitis of left shoulder Stable - cyclobenzaprine (FLEXERIL) 10 MG tablet; Take 1 tablet (10 mg total) by mouth at bedtime.  Dispense: 30 tablet; Refill: 3  5. Hematuria, unspecified type See #3 above - CT RENAL STONE STUDY; Future  Meds ordered this  encounter  Medications  . albuterol (VENTOLIN HFA) 108 (90 Base) MCG/ACT inhaler    Sig: Inhale 2 puffs into the lungs every 6 (six) hours as needed for wheezing or shortness of breath.    Dispense:  8 g    Refill:  0  . benzonatate (TESSALON) 100 MG capsule    Sig: Take 1 capsule (100 mg total) by mouth 3 (three) times daily as needed for cough.    Dispense:  30 capsule    Refill:  0  . Dulaglutide (TRULICITY) 1.5 LK/4.4WN SOPN    Sig: Inject 1.5 mg into the skin once a week.    Dispense:  2 mL    Refill:  6  . cyclobenzaprine (FLEXERIL) 10 MG tablet    Sig: Take 1 tablet (10 mg total) by mouth at bedtime.    Dispense:  30 tablet    Refill:  3    Follow-up: Return in about 1 week (around 09/12/2020) for virtual visit - dyspnea with any Clinician; 3 months PCP- DM.       Charlott Rakes, MD, FAAFP. [facility] and [place], [place]   09/05/2020, 12:08 PM

## 2020-09-10 LAB — NOVEL CORONAVIRUS, NAA: SARS-CoV-2, NAA: DETECTED — AB

## 2020-09-26 ENCOUNTER — Telehealth: Payer: Self-pay

## 2020-09-26 NOTE — Telephone Encounter (Signed)
Gregary Signs with [place] radiology called and states that patient recent CT shows right lower pole renal lesion and they recommend that he has a follow up CT scan in 6-12 months.   Report will be faxed over.

## 2020-09-27 NOTE — Telephone Encounter (Signed)
Noted  

## 2020-09-29 MED FILL — ?ATORVASTATIN 20 MG TABLET: 20 | 30 days supply | Qty: 30 | Fill #3

## 2020-09-29 MED FILL — MELOXICAM 7.5 MG TABLET: 7.5 | 30 days supply | Qty: 30 | Fill #3

## 2020-09-29 MED FILL — SILDENAFIL CITRATE 50 MG TA: 50 | 30 days supply | Qty: 10 | Fill #3

## 2020-09-29 MED FILL — GABAPENTIN 300 MG CAPSULE: 300 | 30 days supply | Qty: 120 | Fill #2

## 2020-09-29 MED FILL — ?TRULICITY 1.5 MG/0.5 ML PE: 1.5 | 28 days supply | Qty: 2 | Fill #1

## 2020-10-20 ENCOUNTER — Telehealth: Payer: Self-pay | Admitting: Family Medicine

## 2020-10-20 NOTE — Telephone Encounter (Signed)
Feet and legs are swelling bad states that when he put on socks he can feel the swelling. It has been going on for two weeks.

## 2020-10-20 NOTE — Telephone Encounter (Signed)
Pt was referred to the mobile unit

## 2020-10-20 NOTE — Telephone Encounter (Signed)
Copied from [place] [id number]. Topic: General - Inquiry >> Oct 20, 2020  9:14 AM Greggory Keen D wrote: Reason for CRM: Pt called asking to speak to Dr. Margarita Rana.  He said it was about his feet and legs swelling  CB# [phone]

## 2020-11-09 ENCOUNTER — Other Ambulatory Visit: Payer: Self-pay

## 2020-11-09 ENCOUNTER — Other Ambulatory Visit: Payer: Self-pay | Admitting: Family Medicine

## 2020-11-09 DIAGNOSIS — G8929 Other chronic pain: Secondary | ICD-10-CM

## 2020-11-09 MED ORDER — SILDENAFIL CITRATE 50 MG PO TABS
ORAL_TABLET | ORAL | 3 refills | Status: DC
  Filled 2020-11-09: qty 10, 30d supply, fill #0
  Filled 2021-01-03: qty 10, 30d supply, fill #1
  Filled 2021-02-07: qty 10, 30d supply, fill #2
  Filled 2021-03-14: qty 10, 30d supply, fill #3

## 2020-11-09 MED ORDER — MELOXICAM 7.5 MG PO TABS
ORAL_TABLET | Freq: Every day | ORAL | 3 refills | Status: DC
  Filled 2020-11-09: qty 30, 30d supply, fill #0
  Filled 2020-12-09: qty 30, 30d supply, fill #1
  Filled 2021-01-03: qty 30, 30d supply, fill #2
  Filled 2021-02-07: qty 30, 30d supply, fill #3

## 2020-11-09 MED FILL — Atorvastatin Calcium Tab 20 MG (Base Equivalent): ORAL | 30 days supply | Qty: 30 | Fill #0 | Status: AC

## 2020-11-09 MED FILL — Gabapentin Cap 300 MG: ORAL | 30 days supply | Qty: 120 | Fill #0 | Status: AC

## 2020-11-09 MED FILL — Dulaglutide Soln Auto-injector 1.5 MG/0.5ML: SUBCUTANEOUS | 28 days supply | Qty: 2 | Fill #0 | Status: AC

## 2020-11-11 ENCOUNTER — Other Ambulatory Visit: Payer: Self-pay

## 2020-12-09 ENCOUNTER — Other Ambulatory Visit: Payer: Self-pay

## 2020-12-09 MED FILL — Atorvastatin Calcium Tab 20 MG (Base Equivalent): ORAL | 30 days supply | Qty: 30 | Fill #1 | Status: CN

## 2020-12-09 MED FILL — Atorvastatin Calcium Tab 20 MG (Base Equivalent): ORAL | 30 days supply | Qty: 30 | Fill #1 | Status: AC

## 2020-12-09 MED FILL — Dulaglutide Soln Auto-injector 1.5 MG/0.5ML: SUBCUTANEOUS | 28 days supply | Qty: 2 | Fill #1 | Status: AC

## 2020-12-12 ENCOUNTER — Other Ambulatory Visit: Payer: Self-pay

## 2021-01-03 ENCOUNTER — Other Ambulatory Visit: Payer: Self-pay

## 2021-01-03 ENCOUNTER — Other Ambulatory Visit: Payer: Self-pay | Admitting: Family Medicine

## 2021-01-03 DIAGNOSIS — G629 Polyneuropathy, unspecified: Secondary | ICD-10-CM

## 2021-01-03 MED ORDER — GABAPENTIN 300 MG PO CAPS
ORAL_CAPSULE | Freq: Two times a day (BID) | ORAL | 0 refills | Status: DC
  Filled 2021-01-03: qty 120, 30d supply, fill #0

## 2021-01-03 MED FILL — Atorvastatin Calcium Tab 20 MG (Base Equivalent): ORAL | 30 days supply | Qty: 30 | Fill #2 | Status: AC

## 2021-01-03 MED FILL — Dulaglutide Soln Auto-injector 1.5 MG/0.5ML: SUBCUTANEOUS | 28 days supply | Qty: 2 | Fill #2 | Status: AC

## 2021-01-16 ENCOUNTER — Encounter: Payer: Self-pay | Admitting: Family Medicine

## 2021-01-16 ENCOUNTER — Ambulatory Visit: Payer: Self-pay | Attending: Family Medicine | Admitting: Family Medicine

## 2021-01-16 ENCOUNTER — Other Ambulatory Visit: Payer: Self-pay

## 2021-01-16 VITALS — BP 117/78 | HR 71 | Ht 73.0 in | Wt 333.0 lb

## 2021-01-16 DIAGNOSIS — Z794 Long term (current) use of insulin: Secondary | ICD-10-CM

## 2021-01-16 DIAGNOSIS — E1169 Type 2 diabetes mellitus with other specified complication: Secondary | ICD-10-CM

## 2021-01-16 DIAGNOSIS — G8929 Other chronic pain: Secondary | ICD-10-CM

## 2021-01-16 DIAGNOSIS — G629 Polyneuropathy, unspecified: Secondary | ICD-10-CM

## 2021-01-16 DIAGNOSIS — M7502 Adhesive capsulitis of left shoulder: Secondary | ICD-10-CM

## 2021-01-16 DIAGNOSIS — M25571 Pain in right ankle and joints of right foot: Secondary | ICD-10-CM

## 2021-01-16 DIAGNOSIS — M25561 Pain in right knee: Secondary | ICD-10-CM

## 2021-01-16 LAB — POCT GLYCOSYLATED HEMOGLOBIN (HGB A1C): HbA1c, POC (controlled diabetic range): 5.6 % (ref 0.0–7.0)

## 2021-01-16 LAB — GLUCOSE, POCT (MANUAL RESULT ENTRY): POC Glucose: 116 mg/dl — AB (ref 70–99)

## 2021-01-16 MED ORDER — ATORVASTATIN CALCIUM 20 MG PO TABS
ORAL_TABLET | Freq: Every day | ORAL | 6 refills | Status: DC
  Filled 2021-01-16: qty 30, fill #0
  Filled 2021-02-07: qty 30, 30d supply, fill #0
  Filled 2021-03-14: qty 30, 30d supply, fill #1
  Filled 2021-04-12: qty 30, 30d supply, fill #2
  Filled 2021-06-02: qty 30, 30d supply, fill #3
  Filled 2021-07-10: qty 30, 30d supply, fill #4
  Filled 2021-08-15: qty 30, 30d supply, fill #0
  Filled 2021-08-15: qty 30, 30d supply, fill #5
  Filled 2021-09-18: qty 30, 30d supply, fill #1

## 2021-01-16 MED ORDER — GABAPENTIN 300 MG PO CAPS
900.0000 mg | ORAL_CAPSULE | Freq: Two times a day (BID) | ORAL | 6 refills | Status: DC
  Filled 2021-01-16 – 2021-02-07 (×2): qty 180, 30d supply, fill #0
  Filled 2021-03-14: qty 180, 30d supply, fill #1
  Filled 2021-05-02: qty 180, 30d supply, fill #2
  Filled 2021-06-02: qty 180, 30d supply, fill #3
  Filled 2021-07-10: qty 180, 30d supply, fill #4
  Filled 2021-08-15: qty 180, 30d supply, fill #0
  Filled 2021-08-15: qty 180, 30d supply, fill #5
  Filled 2021-09-18: qty 180, 30d supply, fill #1

## 2021-01-16 NOTE — Progress Notes (Signed)
Subjective:  Patient ID: Victor Hale, male    DOB: 10-Aug-1975  Age: 45 y.o. MRN: [id number]  CC: Diabetes   HPI Victor Hale   is a 45 year old male with a history of hypertension, type 2 diabetes mellitus (A1c 5.6), obesity, chronic knee pain who presents today for follow-up visit.  Interval History: His R knee still hurts. Also complains of R lateral ankle pain.  I had ordered a right knee x-ray for him which he never underwent and also referred him to orthopedic in the fall of last year but he never followed through.currently on gabapentin and meloxicam.  He has had no trauma to his right ankle but has noticed swelling of the lateral malleolus. He complains of tingling and burning in the lateral aspect of his thighs and his L shoulder.  Diagnosed with left shoulder adhesive capsulitis at the previous visit.  With regards to his diabetes mellitus he does not check his sugars regularly but has been compliant with Trulicity Z7Q is 5.6 down from 6.1 previously.  He has lost 10 pounds since his last office visit. Past Medical History:  Diagnosis Date   Diabetes mellitus without complication ([place])    Fatty liver    Hypertension     History reviewed. No pertinent surgical history.  Family History  Problem Relation Age of Onset   Hypertension Mother    Diabetes Mother     Allergies  Allergen Reactions   Penicillins Itching    Outpatient Medications Prior to Visit  Medication Sig Dispense Refill   albuterol (VENTOLIN HFA) 108 (90 Base) MCG/ACT inhaler INHALE 2 PUFFS INTO THE LUNGS EVERY 6 (SIX) HOURS AS NEEDED FOR WHEEZING OR SHORTNESS OF BREATH. 18 g 0   benzonatate (TESSALON) 100 MG capsule TAKE 1 CAPSULE (100 MG TOTAL) BY MOUTH 3 (THREE) TIMES DAILY AS NEEDED FOR COUGH. 30 capsule 0   Blood Glucose Monitoring Suppl (TRUE METRIX METER) DEVI 1 each by Does not apply route daily before breakfast. 100 each 12   cyclobenzaprine (FLEXERIL) 10 MG tablet TAKE 1 TABLET (10 MG TOTAL)  BY MOUTH AT BEDTIME. 30 tablet 3   Dulaglutide 1.5 MG/0.5ML SOPN INJECT 1.5 MG INTO THE SKIN ONCE A WEEK. 2 mL 6   glucose blood (TRUE METRIX BLOOD GLUCOSE TEST) test strip Use daily before breakfast 100 each 12   Insulin Pen Needle 31G X 5 MM MISC 1 each by Does not apply route at bedtime. 30 each 5   lidocaine (XYLOCAINE) 2 % jelly Apply topically.     meloxicam (MOBIC) 7.5 MG tablet TAKE 1 TABLET (7.5 MG TOTAL) BY MOUTH DAILY. 30 tablet 3   pantoprazole (PROTONIX) 40 MG tablet TAKE 1 TABLET (40 MG TOTAL) BY MOUTH DAILY. 30 tablet 3   sildenafil (VIAGRA) 50 MG tablet TAKE 1 TABLET (50 MG TOTAL) BY MOUTH DAILY AS NEEDED FOR ERECTILE DYSFUNCTION. AT LEAST 24 HOURS BETWEEN DOSES 10 tablet 3   TRUEplus Lancets 28G MISC 1 each by Does not apply route 3 (three) times daily before meals. 100 each 12   atorvastatin (LIPITOR) 20 MG tablet TAKE 1 TABLET (20 MG TOTAL) BY MOUTH DAILY. 30 tablet 6   gabapentin (NEURONTIN) 300 MG capsule TAKE 2 CAPSULES (600 MG TOTAL) BY MOUTH 2 (TWO) TIMES DAILY. 120 capsule 0   No facility-administered medications prior to visit.     ROS Review of Systems  Constitutional:  Negative for activity change and appetite change.  HENT:  Negative for sinus pressure and sore throat.  Eyes:  Negative for visual disturbance.  Respiratory:  Negative for cough, chest tightness and shortness of breath.   Cardiovascular:  Negative for chest pain and leg swelling.  Gastrointestinal:  Negative for abdominal distention, abdominal pain, constipation and diarrhea.  Endocrine: Negative.   Genitourinary:  Negative for dysuria.  Musculoskeletal:        See HPI  Skin:  Negative for rash.  Allergic/Immunologic: Negative.   Neurological:  Negative for weakness, light-headedness and numbness.  Psychiatric/Behavioral:  Negative for dysphoric mood and suicidal ideas.    Objective:  BP 117/78   Pulse 71   Ht 6' 1"  (1.854 m)   Wt (!) 333 lb (151 kg)   SpO2 97%   BMI 43.93 kg/m    BP/Weight 01/16/2021 09/05/2020 84/13/2440  Systolic BP 102 725 366  Diastolic BP 78 88 82  Wt. (Lbs) 333 344 350  BMI 43.93 45.39 46.18      Physical Exam Constitutional:      Appearance: He is well-developed. He is obese.  Neck:     Vascular: No JVD.  Cardiovascular:     Rate and Rhythm: Normal rate.     Heart sounds: Normal heart sounds. No murmur heard. Pulmonary:     Effort: Pulmonary effort is normal.     Breath sounds: Normal breath sounds. No wheezing or rales.  Chest:     Chest wall: No tenderness.  Abdominal:     General: Bowel sounds are normal. There is no distension.     Palpations: Abdomen is soft. There is no mass.     Tenderness: There is no abdominal tenderness.  Musculoskeletal:        General: Tenderness (TTP of R Knee and on ROM) present.     Right lower leg: Edema (R lateral malleolus) present.     Left lower leg: No edema.     Comments: Tenderness and lateral ankle on range of motion of right ankle  Neurological:     Mental Status: He is alert and oriented to person, place, and time.  Psychiatric:        Mood and Affect: Mood normal.    CMP Latest Ref Rng & Units 06/02/2020 10/26/2019 08/27/2019  Glucose 65 - 99 mg/dL 85 472(H) 197(H)  BUN 6 - 24 mg/dL 10 11 15   Creatinine 0.76 - 1.27 mg/dL 0.90 0.97 0.80  Sodium 134 - 144 mmol/L 141 139 138  Potassium 3.5 - 5.2 mmol/L 4.2 4.7 4.2  Chloride 96 - 106 mmol/L 104 103 102  CO2 20 - 29 mmol/L 23 10(L) 21  Calcium 8.7 - 10.2 mg/dL 9.3 9.3 10.0  Total Protein 6.0 - 8.5 g/dL - - -  Total Bilirubin 0.0 - 1.2 mg/dL - - -  Alkaline Phos 39 - 117 IU/L - - -  AST 0 - 40 IU/L - - -  ALT 0 - 44 IU/L - - -    Lipid Panel     Component Value Date/Time   CHOL 152 12/02/2019 0926   TRIG 103 12/02/2019 0926   HDL 50 12/02/2019 0926   CHOLHDL 3.0 12/02/2019 0926   LDLCALC 83 12/02/2019 0926    CBC    Component Value Date/Time   WBC 12.8 (H) 01/31/2019 0536   RBC 4.56 01/31/2019 0536   HGB 14.3  01/31/2019 0536   HGB 14.6 06/20/2014 1422   HCT 42.4 01/31/2019 0536   HCT 43.0 06/20/2014 1422   PLT 287 01/31/2019 0536   PLT 251 06/20/2014 1422  MCV 93.0 01/31/2019 0536   MCV 94 06/20/2014 1422   MCH 31.4 01/31/2019 0536   MCHC 33.7 01/31/2019 0536   RDW 12.4 01/31/2019 0536   RDW 12.9 06/20/2014 1422   LYMPHSABS 2.1 09/17/2016 1218   LYMPHSABS 2.0 06/20/2014 1422   MONOABS 1.6 (H) 09/17/2016 1218   MONOABS 0.7 06/20/2014 1422   EOSABS 0.0 09/17/2016 1218   EOSABS 0.2 06/20/2014 1422   BASOSABS 0.0 09/17/2016 1218   BASOSABS 0.1 06/20/2014 1422    Lab Results  Component Value Date   HGBA1C 5.6 01/16/2021    Assessment & Plan:  1. Type 2 diabetes mellitus with other specified complication, with long-term current use of insulin (HCC) Controlled with A1c of 5.6 and he has been commended Continue current regimen Counseled on Diabetic diet, my plate method, 244 minutes of moderate intensity exercise/week Blood sugar logs with fasting goals of 80-120 mg/dl, random of less than 180 and in the event of sugars less than 60 mg/dl or greater than 400 mg/dl encouraged to notify the clinic. Advised on the need for annual eye exams, annual foot exams, Pneumonia vaccine. - POCT glucose (manual entry) - POCT glycosylated hemoglobin (Hb A1C) - Lipid panel - CMP14+EGFR - atorvastatin (LIPITOR) 20 MG tablet; TAKE 1 TABLET (20 MG TOTAL) BY MOUTH DAILY.  Dispense: 30 tablet; Refill: 6 - Microalbumin / creatinine urine ratio  2. Neuropathy Uncontrolled Increase gabapentin dose Discussed sedating side effects of gabapentin - gabapentin (NEURONTIN) 300 MG capsule; Take 3 capsules (900 mg total) by mouth 2 (two) times daily. Dose increase  Dispense: 180 capsule; Refill: 6  3. Chronic pain of right knee Likely underlying osteoarthritis Currently on meloxicam He will benefit from corticosteroid injections Weight loss will be beneficial - AMB referral to orthopedics - DG Knee Complete  4 Views Right; Future  4. Adhesive capsulitis of left shoulder Continue meloxicam - Ambulatory referral to Physical Therapy  5. Chronic pain of right ankle Possible underlying osteoarthritis Will order imaging - DG Ankle Complete Right; Future   Meds ordered this encounter  Medications   gabapentin (NEURONTIN) 300 MG capsule    Sig: Take 3 capsules (900 mg total) by mouth 2 (two) times daily. Dose increase    Dispense:  180 capsule    Refill:  6    Dose increase   atorvastatin (LIPITOR) 20 MG tablet    Sig: TAKE 1 TABLET (20 MG TOTAL) BY MOUTH DAILY.    Dispense:  30 tablet    Refill:  6    Follow-up: Return in about 6 months (around 07/18/2021) for Diabetes mellitus.       Charlott Rakes, MD, FAAFP. [facility] and [place], [place]   01/16/2021, 11:44 AM

## 2021-01-16 NOTE — Progress Notes (Signed)
Knee still swelling. Tingling and burning in feet and thighs.

## 2021-01-16 NOTE — Patient Instructions (Signed)
Chronic Knee Pain, Adult ?Knee pain that lasts longer than 3 months is called chronic knee pain. You may have pain in one or both knees. Symptoms of chronic knee pain may also include swelling and stiffness. The most common cause is age-related wear and tear (osteoarthritis) of your knee joint. Many conditions can cause chronic knee pain. ?Treatment depends on the cause. The main treatments are physical therapy and weight loss. It may also be treated with medicines, injections, a knee sleeve or brace, and by using crutches. Rest, ice, pressure (compression), and elevation, also known as RICE therapy, may also be recommended. ?Follow these instructions at home: ?If you have a knee sleeve or brace: ? ?Wear the knee sleeve or brace as told by your doctor. Take it off only as told by your doctor. ?Loosen it if your toes: ?Tingle. ?Become numb. ?Turn cold and blue. ?Keep it clean. ?If the sleeve or brace is not waterproof: ?Do not let it get wet. ?Ask your doctor if you may take it off when you take a bath or shower. If not, cover it with a watertight covering. ?Managing pain, stiffness, and swelling ?  ?If told, put heat on your knee. Do this as often as told by your doctor. Use the heat source that your doctor recommends, such as a moist heat pack or a heating pad. ?If you have a removable knee sleeve or brace, take it off as told by your doctor. ?Place a towel between your skin and the heat source. ?Leave the heat on for 20-30 minutes. ?Take off the heat if your skin turns bright red. This is very important. If you cannot feel pain, heat, or cold, you have a greater risk of getting burned. ?If told, put ice on your knee. To do this: ?If you have a removable knee sleeve or brace, take it off as told by your doctor. ?Put ice in a plastic bag. ?Place a towel between your skin and the bag. ?Leave the ice on for 20 minutes, 2-3 times a day. ?Take off the ice if your skin turns bright red. This is very important. If you  cannot feel pain, heat, or cold, you have a greater risk of damage to the area. ?Move your toes often. ?Raise the injured area above the level of your heart while you are sitting or lying down. ?Activity ?Avoid activities where both feet leave the ground at the same time (high-impact activities). Examples are running, jumping rope, and doing jumping jacks. ?Follow the exercise plan that your doctor makes for you. Your doctor may suggest that you: ?Avoid activities that make knee pain worse. You may need to change the exercises that you do, the sports that you participate in, or your job duties. ?Wear shoes with cushioned soles. ?Avoid sports that require running and sudden changes in direction. ?Do exercises or physical therapy. This is planned to match your needs and your abilities. ?Do exercises that increase your balance and strength, such as tai chi and yoga. ?Do not use your injured knee to support your body weight until your doctor says that you can. Use crutches as told by your doctor. ?Return to your normal activities when your doctor says that it is safe. ?General instructions ?Take over-the-counter and prescription medicines only as told by your doctor. ?If you are overweight, work with your doctor and a food expert (dietitian) to set goals to lose weight. Being overweight can make your knee hurt more. ?Do not smoke or use any products that   contain nicotine or tobacco. If you need help quitting, ask your doctor. ?Keep all follow-up visits. ?Contact a doctor if: ?You have knee pain that is not getting better or gets worse. ?You are not able to do your exercises due to knee pain. ?Get help right away if: ?Your knee swells and the swelling gets worse. ?You cannot move your knee. ?You have very bad knee pain. ?Summary ?Knee pain that lasts more than 3 months is called chronic knee pain. ?The main treatments for chronic knee pain are physical therapy and weight loss. You may also need to take medicines, wear a  knee sleeve or brace, use crutches, and put ice or heat on your knee. ?Lose weight if you are overweight. Work with your doctor and a food expert (dietitian) to help you set goals to lose weight. Being overweight can make your knee hurt more. ?Follow the exercise plan that your doctor makes for you. ?This information is not intended to replace advice given to you by your health care provider. Make sure you discuss any questions you have with your health care provider. ?Document Revised: 01/06/2020 Document Reviewed: 01/06/2020 ?Elsevier Patient Education ? 2022 [employer] ? ?

## 2021-01-17 LAB — CMP14+EGFR
ALT: 24 IU/L (ref 0–44)
AST: 20 IU/L (ref 0–40)
Albumin/Globulin Ratio: 1.7 (ref 1.2–2.2)
Albumin: 4.3 g/dL (ref 4.0–5.0)
Alkaline Phosphatase: 53 IU/L (ref 44–121)
BUN/Creatinine Ratio: 14 (ref 9–20)
BUN: 13 mg/dL (ref 6–24)
Bilirubin Total: 0.3 mg/dL (ref 0.0–1.2)
CO2: 21 mmol/L (ref 20–29)
Calcium: 8.9 mg/dL (ref 8.7–10.2)
Chloride: 105 mmol/L (ref 96–106)
Creatinine, Ser: 0.92 mg/dL (ref 0.76–1.27)
Globulin, Total: 2.6 g/dL (ref 1.5–4.5)
Glucose: 100 mg/dL — ABNORMAL HIGH (ref 65–99)
Potassium: 4.2 mmol/L (ref 3.5–5.2)
Sodium: 137 mmol/L (ref 134–144)
Total Protein: 6.9 g/dL (ref 6.0–8.5)
eGFR: 105 mL/min/{1.73_m2} (ref >59)

## 2021-01-17 LAB — MICROALBUMIN / CREATININE URINE RATIO
Creatinine, Urine: 110.1 mg/dL
Microalb/Creat Ratio: 13 mg/g creat (ref 0–29)
Microalbumin, Urine: 14.3 ug/mL

## 2021-01-17 LAB — LIPID PANEL
Chol/HDL Ratio: 3 ratio (ref 0.0–5.0)
Cholesterol, Total: 155 mg/dL (ref 100–199)
HDL: 51 mg/dL (ref >39)
LDL Chol Calc (NIH): 81 mg/dL (ref 0–99)
Triglycerides: 129 mg/dL (ref 0–149)
VLDL Cholesterol Cal: 23 mg/dL (ref 5–40)

## 2021-01-18 ENCOUNTER — Other Ambulatory Visit: Payer: Self-pay

## 2021-01-20 ENCOUNTER — Telehealth: Payer: Self-pay

## 2021-01-20 NOTE — Telephone Encounter (Signed)
Patient name and DOB has been verified Patient was informed of lab results. Patient had no questions.  

## 2021-01-20 NOTE — Telephone Encounter (Signed)
-----   Message from Charlott Rakes, MD sent at 01/17/2021  8:31 AM EDT ----- Cholesterol is normal.  Kidney and liver functions are normal.

## 2021-02-02 ENCOUNTER — Ambulatory Visit: Payer: Self-pay | Admitting: Orthopedic Surgery

## 2021-02-07 ENCOUNTER — Other Ambulatory Visit: Payer: Self-pay

## 2021-02-07 MED FILL — Dulaglutide Soln Auto-injector 1.5 MG/0.5ML: SUBCUTANEOUS | 28 days supply | Qty: 2 | Fill #3 | Status: AC

## 2021-03-14 ENCOUNTER — Other Ambulatory Visit: Payer: Self-pay

## 2021-03-14 ENCOUNTER — Other Ambulatory Visit: Payer: Self-pay | Admitting: Family Medicine

## 2021-03-14 DIAGNOSIS — G8929 Other chronic pain: Secondary | ICD-10-CM

## 2021-03-14 DIAGNOSIS — M25561 Pain in right knee: Secondary | ICD-10-CM

## 2021-03-14 MED ORDER — MELOXICAM 7.5 MG PO TABS
ORAL_TABLET | Freq: Every day | ORAL | 3 refills | Status: DC
  Filled 2021-03-14: qty 30, 30d supply, fill #0
  Filled 2021-04-12: qty 30, 30d supply, fill #1
  Filled 2021-06-02: qty 30, 30d supply, fill #2
  Filled 2021-07-10: qty 30, 30d supply, fill #3

## 2021-03-14 MED FILL — Dulaglutide Soln Auto-injector 1.5 MG/0.5ML: SUBCUTANEOUS | 28 days supply | Qty: 2 | Fill #4 | Status: AC

## 2021-04-12 ENCOUNTER — Other Ambulatory Visit: Payer: Self-pay | Admitting: Family Medicine

## 2021-04-12 ENCOUNTER — Other Ambulatory Visit: Payer: Self-pay

## 2021-04-12 DIAGNOSIS — E1169 Type 2 diabetes mellitus with other specified complication: Secondary | ICD-10-CM

## 2021-04-12 DIAGNOSIS — Z794 Long term (current) use of insulin: Secondary | ICD-10-CM

## 2021-04-12 MED ORDER — SILDENAFIL CITRATE 50 MG PO TABS
ORAL_TABLET | ORAL | 3 refills | Status: DC
Start: 2021-04-12 — End: 2021-09-18
  Filled 2021-04-12: qty 10, 30d supply, fill #0
  Filled 2021-06-02: qty 10, 30d supply, fill #1
  Filled 2021-07-10 (×2): qty 10, 30d supply, fill #2
  Filled 2021-08-15: qty 10, 30d supply, fill #3
  Filled 2021-08-15: qty 10, 30d supply, fill #0

## 2021-04-12 MED ORDER — TRULICITY 1.5 MG/0.5ML ~~LOC~~ SOAJ
1.5000 mg | SUBCUTANEOUS | 3 refills | Status: DC
  Filled 2021-04-12: qty 2, 28d supply, fill #0
  Filled 2021-06-02: qty 2, 28d supply, fill #1
  Filled 2021-07-10: qty 2, 28d supply, fill #2
  Filled 2021-08-15: qty 2, 28d supply, fill #0

## 2021-04-12 NOTE — Telephone Encounter (Signed)
Requested Prescriptions  Pending Prescriptions Disp Refills  . Dulaglutide (TRULICITY) 1.5 0000000 SOPN 2 mL 3    Sig: INJECT 1.5 MG INTO THE SKIN ONCE A WEEK.     Endocrinology:  Diabetes - GLP-1 Receptor Agonists Passed - 04/12/2021  1:33 PM      Passed - HBA1C is between 0 and 7.9 and within 180 days    HbA1c, POC (controlled diabetic range)  Date Value Ref Range Status  01/16/2021 5.6 0.0 - 7.0 % Final         Passed - Valid encounter within last 6 months    Recent Outpatient Visits          2 months ago Type 2 diabetes mellitus with other specified complication, with long-term current use of insulin ([place])   [place], [place], MD   7 months ago Type 2 diabetes mellitus with other specified complication, with long-term current use of insulin ([place])   [place], Charlane Ferretti, MD   8 months ago Need for influenza vaccination   [place], Annie Main L, RPH-CPP   9 months ago Type 2 diabetes mellitus with other specified complication, with long-term current use of insulin [facility])   [place], Jarome Matin, RPH-CPP   10 months ago Encounter for medication counseling   [place], RPH-CPP      Future Appointments            In 3 months Charlott Rakes, MD [place]           . sildenafil (VIAGRA) 50 MG tablet 10 tablet 3    Sig: TAKE 1 TABLET (50 MG TOTAL) BY MOUTH DAILY AS NEEDED FOR ERECTILE DYSFUNCTION. AT LEAST 24 HOURS BETWEEN DOSES     Urology: Erectile Dysfunction Agents Passed - 04/12/2021  1:33 PM      Passed - Last BP in normal range    BP Readings from Last 1 Encounters:  01/16/21 117/78         Passed - Valid encounter within last 12 months    Recent Outpatient Visits          2 months ago Type 2 diabetes mellitus with other  specified complication, with long-term current use of insulin ([place])   Justin, [place], MD   7 months ago Type 2 diabetes mellitus with other specified complication, with long-term current use of insulin ([place])   [place], Charlane Ferretti, MD   8 months ago Need for influenza vaccination   [place], Annie Main L, RPH-CPP   9 months ago Type 2 diabetes mellitus with other specified complication, with long-term current use of insulin [facility])   [place], Jarome Matin, RPH-CPP   10 months ago Encounter for medication counseling   [place], RPH-CPP      Future Appointments            In 3 months Charlott Rakes, MD [place]

## 2021-04-13 ENCOUNTER — Other Ambulatory Visit: Payer: Self-pay

## 2021-04-14 ENCOUNTER — Other Ambulatory Visit: Payer: Self-pay

## 2021-05-02 ENCOUNTER — Other Ambulatory Visit: Payer: Self-pay

## 2021-06-02 ENCOUNTER — Other Ambulatory Visit: Payer: Self-pay

## 2021-07-10 ENCOUNTER — Other Ambulatory Visit: Payer: Self-pay

## 2021-07-10 MED FILL — Cyclobenzaprine HCl Tab 10 MG: ORAL | 30 days supply | Qty: 30 | Fill #0 | Status: AC

## 2021-07-18 ENCOUNTER — Ambulatory Visit: Payer: Self-pay | Attending: Family Medicine | Admitting: Family Medicine

## 2021-07-18 ENCOUNTER — Other Ambulatory Visit: Payer: Self-pay

## 2021-07-18 ENCOUNTER — Encounter: Payer: Self-pay | Admitting: Family Medicine

## 2021-07-18 VITALS — BP 132/83 | HR 78 | Ht 73.0 in | Wt 337.0 lb

## 2021-07-18 DIAGNOSIS — M25511 Pain in right shoulder: Secondary | ICD-10-CM

## 2021-07-18 DIAGNOSIS — R350 Frequency of micturition: Secondary | ICD-10-CM

## 2021-07-18 DIAGNOSIS — Z794 Long term (current) use of insulin: Secondary | ICD-10-CM

## 2021-07-18 DIAGNOSIS — Z23 Encounter for immunization: Secondary | ICD-10-CM

## 2021-07-18 DIAGNOSIS — E1169 Type 2 diabetes mellitus with other specified complication: Secondary | ICD-10-CM

## 2021-07-18 LAB — POCT URINALYSIS DIP (CLINITEK)
Bilirubin, UA: NEGATIVE
Blood, UA: NEGATIVE
Glucose, UA: NEGATIVE mg/dL
Ketones, POC UA: NEGATIVE mg/dL
Leukocytes, UA: NEGATIVE
Nitrite, UA: NEGATIVE
POC PROTEIN,UA: NEGATIVE
Spec Grav, UA: >=1.03 — AB (ref 1.010–1.025)
Urobilinogen, UA: 0.2 E.U./dL
pH, UA: 6 (ref 5.0–8.0)

## 2021-07-18 LAB — POCT GLYCOSYLATED HEMOGLOBIN (HGB A1C): HbA1c, POC (controlled diabetic range): 5.8 % (ref 0.0–7.0)

## 2021-07-18 MED ORDER — TAMSULOSIN HCL 0.4 MG PO CAPS
0.4000 mg | ORAL_CAPSULE | Freq: Every day | ORAL | 3 refills | Status: DC
  Filled 2021-07-18: qty 30, 30d supply, fill #0

## 2021-07-18 MED ORDER — MELOXICAM 7.5 MG PO TABS
ORAL_TABLET | Freq: Every day | ORAL | 3 refills | Status: DC
  Filled 2021-07-18 – 2021-08-15 (×2): qty 30, fill #0
  Filled 2021-08-15: qty 30, 30d supply, fill #0
  Filled 2021-09-18: qty 30, 30d supply, fill #1
  Filled 2021-11-14: qty 30, 30d supply, fill #2

## 2021-07-18 NOTE — Progress Notes (Signed)
Subjective:  Patient ID: Victor Hale, male    DOB: 1975-11-09  Age: 45 y.o. MRN: [id number]  CC: Diabetes   HPI Rahmel Nedved is a 45 y.o. year old male with a history of hypertension, type 2 diabetes mellitus (A1c 5.8), obesity, chronic knee pain who presents today for follow-up visit.  Interval History: Complains of urinary frequency which he has had for a while and has had no dysuria, flank pain, fever, nausea or vomiting.  He has also noticed frothiness of his urine.  He does have increased frequency during both daytime and nighttime but denies urinary hesitancy, straining or dribbling.  1 week ago, he woke up and found himself on the floor after which his R shoulder started hurting. He has had decreased range of motion. He has been applying ice and using a sling which has helped. Pain is a 9.5/10 but was previously a 20. Wrong movement causes hm to have burning in his job is very physical requiring him to perform heavy lifting.  With regards to his diabetes mellitus he is doing well on Trulicity but has not been checking his sugar as he has been very busy with work and has not had much time. He had previously complained of bilateral knee pain for which had referred him to orthopedics but he no showed for the appointment.  He informs me that his work schedule was so busy and he could not make the appointment. Past Medical History:  Diagnosis Date   Diabetes mellitus without complication ([place])    Fatty liver    Hypertension     No past surgical history on file.  Family History  Problem Relation Age of Onset   Hypertension Mother    Diabetes Mother     Allergies  Allergen Reactions   Penicillins Itching    Outpatient Medications Prior to Visit  Medication Sig Dispense Refill   albuterol (VENTOLIN HFA) 108 (90 Base) MCG/ACT inhaler INHALE 2 PUFFS INTO THE LUNGS EVERY 6 (SIX) HOURS AS NEEDED FOR WHEEZING OR SHORTNESS OF BREATH. 18 g 0   atorvastatin (LIPITOR) 20 MG tablet  TAKE 1 TABLET (20 MG TOTAL) BY MOUTH DAILY. 30 tablet 6   Blood Glucose Monitoring Suppl (TRUE METRIX METER) DEVI 1 each by Does not apply route daily before breakfast. 100 each 12   cyclobenzaprine (FLEXERIL) 10 MG tablet TAKE 1 TABLET (10 MG TOTAL) BY MOUTH AT BEDTIME. 30 tablet 3   Dulaglutide (TRULICITY) 1.5 NO/6.7EH SOPN INJECT 1.5 MG INTO THE SKIN ONCE A WEEK. 2 mL 3   gabapentin (NEURONTIN) 300 MG capsule Take 3 capsules (900 mg total) by mouth 2 (two) times daily. Dose increase 180 capsule 6   glucose blood (TRUE METRIX BLOOD GLUCOSE TEST) test strip Use daily before breakfast 100 each 12   Insulin Pen Needle 31G X 5 MM MISC 1 each by Does not apply route at bedtime. 30 each 5   lidocaine (XYLOCAINE) 2 % jelly Apply topically.     sildenafil (VIAGRA) 50 MG tablet TAKE 1 TABLET (50 MG TOTAL) BY MOUTH DAILY AS NEEDED FOR ERECTILE DYSFUNCTION. AT LEAST 24 HOURS BETWEEN DOSES 10 tablet 3   TRUEplus Lancets 28G MISC 1 each by Does not apply route 3 (three) times daily before meals. 100 each 12   meloxicam (MOBIC) 7.5 MG tablet TAKE 1 TABLET (7.5 MG TOTAL) BY MOUTH DAILY. 30 tablet 3   benzonatate (TESSALON) 100 MG capsule TAKE 1 CAPSULE (100 MG TOTAL) BY MOUTH 3 (THREE) TIMES  DAILY AS NEEDED FOR COUGH. (Patient not taking: Reported on 07/18/2021) 30 capsule 0   pantoprazole (PROTONIX) 40 MG tablet TAKE 1 TABLET (40 MG TOTAL) BY MOUTH DAILY. 30 tablet 3   No facility-administered medications prior to visit.     ROS Review of Systems  Constitutional:  Negative for activity change and appetite change.  HENT:  Negative for sinus pressure and sore throat.   Eyes:  Negative for visual disturbance.  Respiratory:  Negative for cough, chest tightness and shortness of breath.   Cardiovascular:  Negative for chest pain and leg swelling.  Gastrointestinal:  Negative for abdominal distention, abdominal pain, constipation and diarrhea.  Endocrine: Negative.   Genitourinary:  Positive for frequency.  Negative for dysuria.  Musculoskeletal:        See HPI  Skin:  Negative for rash.  Allergic/Immunologic: Negative.   Neurological:  Negative for weakness, light-headedness and numbness.  Psychiatric/Behavioral:  Negative for dysphoric mood and suicidal ideas.    Objective:  BP 132/83    Pulse 78    Ht 6\' 1"  (1.854 m)    Wt (!) 337 lb (152.9 kg)    SpO2 97%    BMI 44.46 kg/m   BP/Weight 07/18/2021 01/16/2021 0/05/2724  Systolic BP 366 440 347  Diastolic BP 83 78 88  Wt. (Lbs) 337 333 344  BMI 44.46 43.93 45.39      Physical Exam Constitutional:      Appearance: He is well-developed.  Cardiovascular:     Rate and Rhythm: Normal rate.     Heart sounds: Normal heart sounds. No murmur heard. Pulmonary:     Effort: Pulmonary effort is normal.     Breath sounds: Normal breath sounds. No wheezing or rales.  Chest:     Chest wall: No tenderness.  Abdominal:     General: Bowel sounds are normal. There is no distension.     Palpations: Abdomen is soft. There is no mass.     Tenderness: There is no abdominal tenderness. There is no right CVA tenderness or left CVA tenderness.  Musculoskeletal:     Right lower leg: No edema.     Left lower leg: No edema.     Comments: No tenderness on palpation of both shoulder joints. Active abduction of right shoulder restricted to 60 degrees and associated with pain Range of motion of left shoulder is normal  Neurological:     Mental Status: He is alert and oriented to person, place, and time.  Psychiatric:        Mood and Affect: Mood normal.    CMP Latest Ref Rng & Units 01/16/2021 06/02/2020 10/26/2019  Glucose 65 - 99 mg/dL 100(H) 85 472(H)  BUN 6 - 24 mg/dL 13 10 11   Creatinine 0.76 - 1.27 mg/dL 0.92 0.90 0.97  Sodium 134 - 144 mmol/L 137 141 139  Potassium 3.5 - 5.2 mmol/L 4.2 4.2 4.7  Chloride 96 - 106 mmol/L 105 104 103  CO2 20 - 29 mmol/L 21 23 10(L)  Calcium 8.7 - 10.2 mg/dL 8.9 9.3 9.3  Total Protein 6.0 - 8.5 g/dL 6.9 - -   Total Bilirubin 0.0 - 1.2 mg/dL 0.3 - -  Alkaline Phos 44 - 121 IU/L 53 - -  AST 0 - 40 IU/L 20 - -  ALT 0 - 44 IU/L 24 - -    Lipid Panel     Component Value Date/Time   CHOL 155 01/16/2021 0910   TRIG 129 01/16/2021 0910  HDL 51 01/16/2021 0910   CHOLHDL 3.0 01/16/2021 0910   LDLCALC 81 01/16/2021 0910    CBC    Component Value Date/Time   WBC 12.8 (H) 01/31/2019 0536   RBC 4.56 01/31/2019 0536   HGB 14.3 01/31/2019 0536   HGB 14.6 06/20/2014 1422   HCT 42.4 01/31/2019 0536   HCT 43.0 06/20/2014 1422   PLT 287 01/31/2019 0536   PLT 251 06/20/2014 1422   MCV 93.0 01/31/2019 0536   MCV 94 06/20/2014 1422   MCH 31.4 01/31/2019 0536   MCHC 33.7 01/31/2019 0536   RDW 12.4 01/31/2019 0536   RDW 12.9 06/20/2014 1422   LYMPHSABS 2.1 09/17/2016 1218   LYMPHSABS 2.0 06/20/2014 1422   MONOABS 1.6 (H) 09/17/2016 1218   MONOABS 0.7 06/20/2014 1422   EOSABS 0.0 09/17/2016 1218   EOSABS 0.2 06/20/2014 1422   BASOSABS 0.0 09/17/2016 1218   BASOSABS 0.1 06/20/2014 1422    Lab Results  Component Value Date   HGBA1C 5.8 07/18/2021    Assessment & Plan:  1. Type 2 diabetes mellitus with other specified complication, with long-term current use of insulin (HCC) Controlled with A1c of 5.8 Continue current regimen Counseled on Diabetic diet, my plate method, 161 minutes of moderate intensity exercise/week Blood sugar logs with fasting goals of 80-120 mg/dl, random of less than 180 and in the event of sugars less than 60 mg/dl or greater than 400 mg/dl encouraged to notify the clinic. Advised on the need for annual eye exams, annual foot exams, Pneumonia vaccine.  - POCT glycosylated hemoglobin (Hb W9U) - Basic Metabolic Panel  2. Frequent urination UA is negative for UTI Will send out urine microscopy especially given complaints of urine frothiness I will check his PSA and also initiate Flomax in the event that he does have irritative prostate symptoms Glycemic control is  optimal also diabetes has been ruled out as a possible etiology Also advised that during the cold weather he is likely to experience increased urination - POCT URINALYSIS DIP (CLINITEK) - Urinalysis, microscopic only - Fecal occult blood, imunochemical([facility]) - tamsulosin (FLOMAX) 0.4 MG CAPS capsule; Take 1 capsule (0.4 mg total) by mouth daily.  Dispense: 30 capsule; Refill: 3 - PSA, total and free  3. Acute pain of right shoulder Secondary to trauma Would like to exclude dislocation however he declines referral for x-ray as he states his symptoms are gradually improving Continue with ice, NSAID and muscle relaxant and if symptoms persist or worsen he needs to contact the clinic so imaging can be ordered and appropriate referral placed. - meloxicam (MOBIC) 7.5 MG tablet; TAKE 1 TABLET (7.5 MG TOTAL) BY MOUTH DAILY.  Dispense: 30 tablet; Refill: 3    Meds ordered this encounter  Medications   tamsulosin (FLOMAX) 0.4 MG CAPS capsule    Sig: Take 1 capsule (0.4 mg total) by mouth daily.    Dispense:  30 capsule    Refill:  3   meloxicam (MOBIC) 7.5 MG tablet    Sig: TAKE 1 TABLET (7.5 MG TOTAL) BY MOUTH DAILY.    Dispense:  30 tablet    Refill:  3     Follow-up: Return in about 6 months (around 01/16/2022) for Chronic medical conditions.       Charlott Rakes, MD, FAAFP. [facility] [place], [place]   07/18/2021, 9:38 AM

## 2021-07-18 NOTE — Patient Instructions (Addendum)
Shoulder Pain °Many things can cause shoulder pain, including: °An injury to the shoulder. °Overuse of the shoulder. °Arthritis. °The source of the pain can be: °Inflammation. °An injury to the shoulder joint. °An injury to a tendon, ligament, or bone. °Follow these instructions at home: °Pay attention to changes in your symptoms. Let your health care provider know about them. Follow these instructions to relieve your pain. °If you have a sling: °Wear the sling as told by your health care provider. Remove it only as told by your health care provider. °Loosen the sling if your fingers tingle, become numb, or turn cold and blue. °Keep the sling clean. °If the sling is not waterproof: °Do not let it get wet. Remove it to shower or bathe. °Move your arm as little as possible, but keep your hand moving to prevent swelling. °Managing pain, stiffness, and swelling ° °If directed, put ice on the painful area: °Put ice in a plastic bag. °Place a towel between your skin and the bag. °Leave the ice on for 20 minutes, 2-3 times per day. Stop applying ice if it does not help with the pain. °Squeeze a soft ball or a foam pad as much as possible. This helps to keep the shoulder from swelling. It also helps to strengthen the arm. °General instructions °Take over-the-counter and prescription medicines only as told by your health care provider. °Keep all follow-up visits as told by your health care provider. This is important. °Contact a health care provider if: °Your pain gets worse. °Your pain is not relieved with medicines. °New pain develops in your arm, hand, or fingers. °Get help right away if: °Your arm, hand, or fingers: °Tingle. °Become numb. °Become swollen. °Become painful. °Turn white or blue. °Summary °Shoulder pain can be caused by an injury, overuse, or arthritis. °Pay attention to changes in your symptoms. Let your health care provider know about them. °This condition may be treated with a sling, ice, and pain  medicines. °Contact your health care provider if the pain gets worse or new pain develops. Get help right away if your arm, hand, or fingers tingle or become numb, swollen, or painful. °Keep all follow-up visits as told by your health care provider. This is important. °This information is not intended to replace advice given to you by your health care provider. Make sure you discuss any questions you have with your health care provider. °Document Revised: 02/04/2018 Document Reviewed: 02/04/2018 °Elsevier Patient Education © 2022 [employer] ° °

## 2021-07-18 NOTE — Addendum Note (Signed)
Addended by: Gomez Cleverly on: 07/18/2021 09:50 AM   Modules accepted: Orders

## 2021-07-18 NOTE — Progress Notes (Signed)
Frequent urination. Right shoulder pain.

## 2021-07-19 LAB — BASIC METABOLIC PANEL
BUN/Creatinine Ratio: 13 (ref 9–20)
BUN: 11 mg/dL (ref 6–24)
CO2: 22 mmol/L (ref 20–29)
Calcium: 8.8 mg/dL (ref 8.7–10.2)
Chloride: 105 mmol/L (ref 96–106)
Creatinine, Ser: 0.86 mg/dL (ref 0.76–1.27)
Glucose: 79 mg/dL (ref 70–99)
Potassium: 4.5 mmol/L (ref 3.5–5.2)
Sodium: 139 mmol/L (ref 134–144)
eGFR: 109 mL/min/{1.73_m2} (ref >59)

## 2021-07-19 LAB — URINALYSIS, MICROSCOPIC ONLY
Bacteria, UA: NONE SEEN
Casts: NONE SEEN /lpf
RBC, Urine: NONE SEEN /hpf (ref 0–2)

## 2021-07-19 LAB — PSA, TOTAL AND FREE
PSA, Free Pct: 33.3 %
PSA, Free: 0.1 ng/mL
Prostate Specific Ag, Serum: 0.3 ng/mL (ref 0.0–4.0)

## 2021-07-21 ENCOUNTER — Telehealth: Payer: Self-pay

## 2021-07-21 NOTE — Telephone Encounter (Signed)
-----   Message from Charlott Rakes, MD sent at 07/19/2021 11:10 AM EST ----- Please inform the patient that labs are normal. Thank you.

## 2021-07-21 NOTE — Telephone Encounter (Signed)
Patient name and DOB has been verified Patient was informed of lab results. Patient had no questions.  

## 2021-07-21 NOTE — Telephone Encounter (Signed)
Patient was called and a voicemail was left informing patient to return phone call for lab results.   CRM created.

## 2021-08-15 ENCOUNTER — Other Ambulatory Visit: Payer: Self-pay

## 2021-08-21 DIAGNOSIS — M79601 Pain in right arm: Secondary | ICD-10-CM | POA: Diagnosis not present

## 2021-08-21 DIAGNOSIS — Y9289 Other specified places as the place of occurrence of the external cause: Secondary | ICD-10-CM | POA: Diagnosis not present

## 2021-08-21 DIAGNOSIS — M7501 Adhesive capsulitis of right shoulder: Secondary | ICD-10-CM | POA: Diagnosis not present

## 2021-08-21 DIAGNOSIS — W19XXXA Unspecified fall, initial encounter: Secondary | ICD-10-CM | POA: Diagnosis not present

## 2021-08-21 DIAGNOSIS — R030 Elevated blood-pressure reading, without diagnosis of hypertension: Secondary | ICD-10-CM | POA: Diagnosis not present

## 2021-08-21 DIAGNOSIS — R200 Anesthesia of skin: Secondary | ICD-10-CM | POA: Diagnosis not present

## 2021-08-21 DIAGNOSIS — M25511 Pain in right shoulder: Secondary | ICD-10-CM | POA: Diagnosis not present

## 2021-09-18 ENCOUNTER — Other Ambulatory Visit: Payer: Self-pay

## 2021-09-18 ENCOUNTER — Other Ambulatory Visit: Payer: Self-pay | Admitting: Family Medicine

## 2021-09-18 DIAGNOSIS — E1169 Type 2 diabetes mellitus with other specified complication: Secondary | ICD-10-CM

## 2021-09-19 ENCOUNTER — Other Ambulatory Visit: Payer: Self-pay

## 2021-09-19 MED ORDER — TRULICITY 1.5 MG/0.5ML ~~LOC~~ SOAJ
1.5000 mg | SUBCUTANEOUS | 3 refills | Status: DC
  Filled 2021-09-19: qty 2, 28d supply, fill #0
  Filled 2021-11-14: qty 2, 28d supply, fill #1

## 2021-09-19 MED ORDER — SILDENAFIL CITRATE 50 MG PO TABS
ORAL_TABLET | ORAL | 3 refills | Status: DC
  Filled 2021-09-19: qty 10, 30d supply, fill #0
  Filled 2021-11-14: qty 4, 30d supply, fill #1
  Filled 2022-02-15: qty 10, 30d supply, fill #2
  Filled 2022-02-15: qty 4, 30d supply, fill #2
  Filled 2022-04-17: qty 4, 30d supply, fill #3
  Filled 2022-06-18: qty 10, 30d supply, fill #4

## 2021-09-19 NOTE — Telephone Encounter (Signed)
Requested Prescriptions  Pending Prescriptions Disp Refills   Dulaglutide (TRULICITY) 1.5 ML/4.6TK SOPN 2 mL 3    Sig: INJECT 1.5 MG INTO THE SKIN ONCE A WEEK.     Endocrinology:  Diabetes - GLP-1 Receptor Agonists Passed - 09/18/2021 12:28 PM      Passed - HBA1C is between 0 and 7.9 and within 180 days    HbA1c, POC (controlled diabetic range)  Date Value Ref Range Status  07/18/2021 5.8 0.0 - 7.0 % Final         Passed - Valid encounter within last 6 months    Recent Outpatient Visits          2 months ago Type 2 diabetes mellitus with other specified complication, with long-term current use of insulin ([place])   [place], [place], MD   8 months ago Type 2 diabetes mellitus with other specified complication, with long-term current use of insulin ([place])   [place], [place], MD   1 year ago Type 2 diabetes mellitus with other specified complication, with long-term current use of insulin ([place])   [place], [place], MD   1 year ago Need for influenza vaccination   [place], Annie Main L, RPH-CPP   1 year ago Type 2 diabetes mellitus with other specified complication, with long-term current use of insulin [facility])   [place], Jarome Matin, RPH-CPP      Future Appointments            In 3 months Charlott Rakes, MD [place]            sildenafil (VIAGRA) 50 MG tablet 10 tablet 3    Sig: TAKE 1 TABLET (50 MG TOTAL) BY MOUTH DAILY AS NEEDED FOR ERECTILE DYSFUNCTION. AT LEAST 24 HOURS BETWEEN DOSES     Urology: Erectile Dysfunction Agents Passed - 09/18/2021 12:28 PM      Passed - AST in normal range and within 360 days    AST  Date Value Ref Range Status  01/16/2021 20 0 - 40 IU/L Final   SGOT(AST)  Date Value Ref Range Status  06/20/2014 37 15 - 37  Unit/L Final         Passed - ALT in normal range and within 360 days    ALT  Date Value Ref Range Status  01/16/2021 24 0 - 44 IU/L Final   SGPT (ALT)  Date Value Ref Range Status  06/20/2014 45 U/L Final    Comment:    14-63 NOTE: New Reference Range 02/23/14          Passed - Last BP in normal range    BP Readings from Last 1 Encounters:  07/18/21 132/83         Passed - Valid encounter within last 12 months    Recent Outpatient Visits          2 months ago Type 2 diabetes mellitus with other specified complication, with long-term current use of insulin ([place])   [place], [place], MD   8 months ago Type 2 diabetes mellitus with other specified complication, with long-term current use of insulin ([place])   [place], Charlane Ferretti, MD   1 year ago Type 2 diabetes mellitus with other specified complication, with long-term current use of  insulin [facility])   [place] Charlott Rakes, MD   1 year ago Need for influenza vaccination   [place], RPH-CPP   1 year ago Type 2 diabetes mellitus with other specified complication, with long-term current use of insulin [facility])   [place], RPH-CPP      Future Appointments            In 3 months Charlott Rakes, MD [place]

## 2021-09-20 DIAGNOSIS — M24811 Other specific joint derangements of right shoulder, not elsewhere classified: Secondary | ICD-10-CM | POA: Diagnosis not present

## 2021-09-20 DIAGNOSIS — M542 Cervicalgia: Secondary | ICD-10-CM | POA: Diagnosis not present

## 2021-09-20 DIAGNOSIS — M5412 Radiculopathy, cervical region: Secondary | ICD-10-CM | POA: Diagnosis not present

## 2021-10-03 DIAGNOSIS — M47812 Spondylosis without myelopathy or radiculopathy, cervical region: Secondary | ICD-10-CM | POA: Diagnosis not present

## 2021-10-03 DIAGNOSIS — M75121 Complete rotator cuff tear or rupture of right shoulder, not specified as traumatic: Secondary | ICD-10-CM | POA: Diagnosis not present

## 2021-10-03 DIAGNOSIS — M19011 Primary osteoarthritis, right shoulder: Secondary | ICD-10-CM | POA: Diagnosis not present

## 2021-10-03 DIAGNOSIS — M24811 Other specific joint derangements of right shoulder, not elsewhere classified: Secondary | ICD-10-CM | POA: Diagnosis not present

## 2021-10-03 DIAGNOSIS — M542 Cervicalgia: Secondary | ICD-10-CM | POA: Diagnosis not present

## 2021-10-17 ENCOUNTER — Telehealth: Payer: Self-pay

## 2021-10-17 NOTE — Telephone Encounter (Signed)
Left vm to confirm 10/19/21 appointment-Toni ?

## 2021-10-19 ENCOUNTER — Ambulatory Visit: Payer: BC Managed Care – PPO | Admitting: Physician Assistant

## 2021-10-19 ENCOUNTER — Other Ambulatory Visit: Payer: Self-pay

## 2021-10-19 ENCOUNTER — Telehealth: Payer: Self-pay

## 2021-10-19 ENCOUNTER — Encounter: Payer: Self-pay | Admitting: Physician Assistant

## 2021-10-19 DIAGNOSIS — Z7689 Persons encountering health services in other specified circumstances: Secondary | ICD-10-CM

## 2021-10-19 DIAGNOSIS — E1140 Type 2 diabetes mellitus with diabetic neuropathy, unspecified: Secondary | ICD-10-CM | POA: Diagnosis not present

## 2021-10-19 DIAGNOSIS — R030 Elevated blood-pressure reading, without diagnosis of hypertension: Secondary | ICD-10-CM

## 2021-10-19 DIAGNOSIS — Z0100 Encounter for examination of eyes and vision without abnormal findings: Secondary | ICD-10-CM

## 2021-10-19 DIAGNOSIS — E119 Type 2 diabetes mellitus without complications: Secondary | ICD-10-CM

## 2021-10-19 DIAGNOSIS — G4710 Hypersomnia, unspecified: Secondary | ICD-10-CM | POA: Diagnosis not present

## 2021-10-19 DIAGNOSIS — G629 Polyneuropathy, unspecified: Secondary | ICD-10-CM

## 2021-10-19 DIAGNOSIS — R5383 Other fatigue: Secondary | ICD-10-CM

## 2021-10-19 DIAGNOSIS — Z6841 Body Mass Index (BMI) 40.0 and over, adult: Secondary | ICD-10-CM

## 2021-10-19 MED ORDER — ATORVASTATIN CALCIUM 20 MG PO TABS
ORAL_TABLET | Freq: Every day | ORAL | 6 refills | Status: DC
  Filled 2021-10-19: qty 30, 30d supply, fill #0
  Filled 2021-11-14: qty 30, 30d supply, fill #1
  Filled 2022-02-15: qty 30, 30d supply, fill #2
  Filled 2022-04-17: qty 30, 30d supply, fill #3
  Filled 2022-06-18: qty 30, 30d supply, fill #4
  Filled 2022-08-09: qty 30, 30d supply, fill #5

## 2021-10-19 MED ORDER — GABAPENTIN 300 MG PO CAPS
ORAL_CAPSULE | ORAL | 1 refills | Status: DC
  Filled 2021-10-19: qty 120, 30d supply, fill #0
  Filled 2021-11-15: qty 120, 30d supply, fill #1

## 2021-10-19 NOTE — Telephone Encounter (Signed)
Awaiting 10/19/21 office notes for ophthalmology referral-Toni ?

## 2021-10-19 NOTE — Progress Notes (Signed)
[facility] ?[address] ?[place], [place] [address] ? ?Internal MEDICINE  ?Office Visit Note ? ?Patient Name: Mykael Batz ? [id number]  ?[id number] ? ?Date of Service: 10/20/2021 ? ? ?Complaints/HPI ?Pt is here for establishment of PCP. ?Chief Complaint  ?Patient presents with  ? New Patient (Initial Visit)  ? Diabetes  ?  Wants better control of diabetes  ? Hypertension  ? ?HPI ?Pt is here to establish care, last PCP in December ?-Hx of Diabetes with neuropathy and takes '900mg'$  of gabapentin twice per day and still reports some burning at times ?-He is also taking trulicity and his last G3T was well controlled at 5.8 by previous provider ?-Does not take anything for high blood pressure but thinks this was diagnosed in the past ?-He takes meloxicam for right knee pain as needed ?-He also reports that he has a right shoulder injury, followed by ortho--he will follow up this, cant lift shoulder ?-Works as a [employer] manager,  lots of lifting and movement involved, works most every day; 7am-4:30-5pm ?-he reports that he has 4 kids ?-Lives alone unless kids visiting ?-Current smoker of marijuana--6 days per week, never smoked cigs, does not drink alcohol ?-no exercise outside of work, but very active standing all day ?-watches diet ?-vision declining and will need to establish with an ophthalmologist for eye exam ?-Pt easily falls asleep, can fall asleep in traffic behind the wheel. Girlfriend has witnessed apneas, he reports snoring, gasping, choking awake and feeling sleepy during the day.  Discussed the dangers of driving while sleepy at length and encouraged patient to avoid driving or to pull over if any signs of drowsiness.  Stressed the importance of having sleep study done in order to get on treatment ?EPWORTH SLEEPINESS SCALE: ? ?Scale:  ?(0)= no chance of dozing; (1)= slight chance of dozing; (2)= moderate chance of dozing; (3)= high chance of dozing ? ?Chance  Situtation ?   ?Sitting and reading:  3 ?  ? Watching TV: 3 ?   ?Sitting Inactive in public: 0 ?   ?As a passenger in car: 1   ?   ?Lying down to rest: 3 ?   ?Sitting and talking: 3 ?   ?Sitting quielty after lunch: 0 ?   ?In a car, stopped in traffic: 3 ? ? ?TOTAL SCORE:   16 out of 24 ? ? ?Current Medication: ?Outpatient Encounter Medications as of 10/19/2021  ?Medication Sig  ? Blood Glucose Monitoring Suppl (TRUE METRIX METER) DEVI 1 each by Does not apply route daily before breakfast.  ? Dulaglutide (TRULICITY) 1.5 DV/7.6HY SOPN INJECT 1.5 MG INTO THE SKIN ONCE A WEEK.  ? glucose blood (TRUE METRIX BLOOD GLUCOSE TEST) test strip Use daily before breakfast  ? Insulin Pen Needle 31G X 5 MM MISC 1 each by Does not apply route at bedtime.  ? meloxicam (MOBIC) 7.5 MG tablet TAKE 1 TABLET (7.5 MG TOTAL) BY MOUTH DAILY.  ? sildenafil (VIAGRA) 50 MG tablet TAKE 1 TABLET (50 MG TOTAL) BY MOUTH DAILY AS NEEDED FOR ERECTILE DYSFUNCTION. AT LEAST 24 HOURS BETWEEN DOSES  ? tamsulosin (FLOMAX) 0.4 MG CAPS capsule Take 1 capsule (0.4 mg total) by mouth daily.  ? TRUEplus Lancets 28G MISC 1 each by Does not apply route 3 (three) times daily before meals.  ? [DISCONTINUED] atorvastatin (LIPITOR) 20 MG tablet TAKE 1 TABLET (20 MG TOTAL) BY MOUTH DAILY.  ? [DISCONTINUED] gabapentin (NEURONTIN) 300 MG capsule Take 3 capsules (900 mg total) by mouth  2 (two) times daily. Dose increase  ? albuterol (VENTOLIN HFA) 108 (90 Base) MCG/ACT inhaler INHALE 2 PUFFS INTO THE LUNGS EVERY 6 (SIX) HOURS AS NEEDED FOR WHEEZING OR SHORTNESS OF BREATH.  ? atorvastatin (LIPITOR) 20 MG tablet TAKE 1 TABLET (20 MG TOTAL) BY MOUTH DAILY.  ? gabapentin (NEURONTIN) 300 MG capsule Take 1 capsule by mouth in AM and 2-3 caps by mouth at night before bed  ? [DISCONTINUED] dicyclomine (BENTYL) 20 MG tablet Take 1 tablet (20 mg total) by mouth 2 (two) times daily. (Patient not taking: Reported on 07/01/2017)  ? [DISCONTINUED] famotidine (PEPCID) 20 MG tablet Take 1 tablet (20 mg total) by mouth 2  (two) times daily. (Patient not taking: Reported on 07/01/2017)  ? [DISCONTINUED] lidocaine (XYLOCAINE) 2 % jelly Apply topically. (Patient not taking: Reported on 10/19/2021)  ? [DISCONTINUED] omeprazole (PRILOSEC) 20 MG capsule Take 1 capsule (20 mg total) by mouth daily. (Patient not taking: Reported on 07/01/2017)  ? [DISCONTINUED] pantoprazole (PROTONIX) 40 MG tablet TAKE 1 TABLET (40 MG TOTAL) BY MOUTH DAILY.  ? ?No facility-administered encounter medications on file as of 10/19/2021.  ? ? ?Surgical History: ?History reviewed. No pertinent surgical history. ? ?Medical History: ?Past Medical History:  ?Diagnosis Date  ? Diabetes mellitus without complication ([place])   ? Fatty liver   ? Hypertension   ? ? ?Family History: ?Family History  ?Problem Relation Age of Onset  ? Hypertension Mother   ? Diabetes Mother   ? ? ?Social History  ? ?Socioeconomic History  ? Marital status: Married  ?  Spouse name: Not on file  ? Number of children: Not on file  ? Years of education: Not on file  ? Highest education level: Not on file  ?Occupational History  ? Not on file  ?Tobacco Use  ? Smoking status: Never  ? Smokeless tobacco: Never  ?Substance and Sexual Activity  ? Alcohol use: No  ? Drug use: Yes  ?  Frequency: 6.0 times per week  ?  Types: Marijuana  ? Sexual activity: Yes  ?Other Topics Concern  ? Not on file  ?Social History Narrative  ? Not on file  ? ?Social Determinants of Health  ? ?Financial Resource Strain: Not on file  ?Food Insecurity: Not on file  ?Transportation Needs: Not on file  ?Physical Activity: Not on file  ?Stress: Not on file  ?Social Connections: Not on file  ?Intimate Partner Violence: Not on file  ? ? ? ?Review of Systems  ?Constitutional:  Positive for fatigue. Negative for chills and unexpected weight change.  ?HENT:  Negative for congestion, postnasal drip, rhinorrhea, sneezing and sore throat.   ?Eyes:  Positive for visual disturbance. Negative for redness.  ?Respiratory:  Negative for cough,  chest tightness and shortness of breath.   ?Cardiovascular:  Negative for chest pain and palpitations.  ?Gastrointestinal:  Negative for abdominal pain, constipation, diarrhea, nausea and vomiting.  ?Genitourinary:  Negative for dysuria and frequency.  ?Musculoskeletal:  Positive for arthralgias. Negative for back pain, joint swelling and neck pain.  ?Skin:  Negative for rash.  ?Neurological: Negative.  Negative for tremors and numbness.  ?Hematological:  Negative for adenopathy. Does not bruise/bleed easily.  ?Psychiatric/Behavioral:  Positive for sleep disturbance. Negative for behavioral problems (Depression) and suicidal ideas. The patient is not nervous/anxious.   ? ?Vital Signs: ?BP (!) 140/92 Comment: 142/94  Pulse 75   Temp 98.3 ?F (36.8 ?C)   Resp 16   Ht '6\' 1"'$  (1.854 m)  Wt (!) 329 lb 9.6 oz (149.5 kg)   SpO2 96%   BMI 43.49 kg/m?  ? ? ?Physical Exam ?Vitals and nursing note reviewed.  ?Constitutional:   ?   General: He is not in acute distress. ?   Appearance: He is well-developed. He is obese. He is not diaphoretic.  ?HENT:  ?   Head: Normocephalic and atraumatic.  ?   Mouth/Throat:  ?   Pharynx: No oropharyngeal exudate.  ?Eyes:  ?   Pupils: Pupils are equal, round, and reactive to light.  ?Neck:  ?   Thyroid: No thyromegaly.  ?   Vascular: No JVD.  ?   Trachea: No tracheal deviation.  ?Cardiovascular:  ?   Rate and Rhythm: Normal rate and regular rhythm.  ?   Heart sounds: Normal heart sounds. No murmur heard. ?  No friction rub. No gallop.  ?Pulmonary:  ?   Effort: Pulmonary effort is normal. No respiratory distress.  ?   Breath sounds: No wheezing or rales.  ?Chest:  ?   Chest wall: No tenderness.  ?Abdominal:  ?   General: Bowel sounds are normal.  ?   Palpations: Abdomen is soft.  ?Musculoskeletal:     ?   General: Normal range of motion.  ?   Cervical back: Normal range of motion and neck supple.  ?Lymphadenopathy:  ?   Cervical: No cervical adenopathy.  ?Skin: ?   General: Skin is warm  and dry.  ?Neurological:  ?   Mental Status: He is alert and oriented to person, place, and time.  ?   Cranial Nerves: No cranial nerve deficit.  ?Psychiatric:     ?   Behavior: Behavior normal.     ?   Thought

## 2021-10-19 NOTE — Telephone Encounter (Signed)
SS ordered. Printed & gave to [facility] ?

## 2021-10-20 ENCOUNTER — Other Ambulatory Visit: Payer: Self-pay

## 2021-10-20 NOTE — Patient Instructions (Signed)
Hypersomnia Hypersomnia is a condition in which a person feels very tired during the day even though he or she gets plenty of sleep at night. A person with this condition may take naps during the day and may find it very difficult to wake up from sleep. Hypersomnia may affect a person's ability to think, concentrate, drive, or remember things. What are the causes? The cause of this condition may not be known. Possible causes include: Certain medicines. Sleep disorders, such as narcolepsy and sleep apnea. Injury to the head, brain, or spinal cord. Drug or alcohol use. Gastroesophageal reflux disease (GERD). Tumors. Certain medical conditions, such as depression, diabetes, or an underactive thyroid gland (hypothyroidism). What are the signs or symptoms? The main symptoms of hypersomnia include: Feeling very tired throughout the day, regardless of how much sleep you got the night before. Having trouble waking up. Others may find it difficult to wake you up when you are sleeping. Sleeping for longer and longer periods at a time. Taking naps throughout the day. Other symptoms may include: Feeling restless, anxious, or annoyed. Lacking energy. Having trouble with: Remembering. Speaking. Thinking. Loss of appetite. Seeing, hearing, tasting, smelling, or feeling things that are not real (hallucinations). How is this diagnosed? This condition may be diagnosed based on: Your symptoms and medical history. Your sleeping habits. Your health care provider may ask you to write down your sleeping habits in a daily sleep log, along with any symptoms you have. A series of tests that are done while you sleep (sleep study or polysomnogram). A test that measures how quickly you can fall asleep during the day (daytime nap study or multiple sleep latency test). How is this treated? Treatment can help you manage your condition. Treatment may include: Following a regular sleep routine. Lifestyle changes,  such as changing your eating habits, getting regular exercise, and avoiding alcohol or caffeinated beverages. Taking medicines to make you more alert (stimulants) during the day. Treating any underlying medical causes of hypersomnia. Follow these instructions at home: Sleep routine  Schedule the same bedtime and wake-up time each day. Practice a relaxing bedtime routine. This may include reading, meditation, deep breathing, or taking a warm bath before going to sleep. Get regular exercise each day. Avoid strenuous exercise in the evening hours. Keep your sleep environment at a cooler temperature, darkened, and quiet. Sleep with pillows and a mattress that are comfortable and supportive. Schedule short 20-minute naps for when you feel sleepiest during the day. Talk with your employer or teachers about your hypersomnia. If possible, adjust your schedule so that: You have a regular daytime work schedule. You can take a scheduled nap during the day. You do not have to work or be active at night. Do not eat a heavy meal for a few hours before bedtime. Eat your meals at about the same times every day. Avoid drinking alcohol or caffeinated beverages. Safety  Do not drive or use heavy machinery if you are sleepy. Ask your health care provider if it is safe for you to drive. Wear a life jacket when swimming or spending time near water. General instructions Take supplements and over-the-counter and prescription medicines only as told by your health care provider. Keep a sleep log that will help your doctor manage your condition. This may include information about: What time you go to bed each night. How often you wake up at night. How many hours you sleep at night. How often and for how long you nap during the day.   Any observations from others, such as leg movements during sleep, sleep walking, or snoring. Keep all follow-up visits as told by your health care provider. This is important. Contact  a health care provider if: You have new symptoms. Your symptoms get worse. Get help right away if: You have serious thoughts about hurting yourself or someone else. If you ever feel like you may hurt yourself or others, or have thoughts about taking your own life, get help right away. You can go to your nearest emergency department or call: Your local emergency services (911 in the [place] A suicide crisis helpline, such as the National Suicide Prevention Lifeline at 1-800-273-8255 or 988 in the [place] This is open 24 hours a day. Summary Hypersomnia refers to a condition in which you feel very tired during the day even though you get plenty of sleep at night. A person with this condition may take naps during the day and may find it very difficult to wake up from sleep. Hypersomnia may affect a person's ability to think, concentrate, drive, or remember things. Treatment, such as following a regular sleep routine and making some lifestyle changes, can help you manage your condition. This information is not intended to replace advice given to you by your health care provider. Make sure you discuss any questions you have with your health care provider. Document Revised: 02/15/2021 Document Reviewed: 06/02/2020 Elsevier Patient Education  2022 [employer]  

## 2021-10-25 NOTE — Telephone Encounter (Signed)
Referral sent via proficient to [facility] ?

## 2021-10-26 ENCOUNTER — Telehealth: Payer: Self-pay

## 2021-10-26 NOTE — Telephone Encounter (Signed)
Patient scheduled for PSG on 11/28/21 @ [facility] ?

## 2021-10-31 ENCOUNTER — Telehealth: Payer: Self-pay

## 2021-10-31 NOTE — Telephone Encounter (Signed)
Ophthalmology appointment 11/01/21 with [facility] ?

## 2021-11-01 DIAGNOSIS — Z0100 Encounter for examination of eyes and vision without abnormal findings: Secondary | ICD-10-CM | POA: Diagnosis not present

## 2021-11-01 DIAGNOSIS — H40003 Preglaucoma, unspecified, bilateral: Secondary | ICD-10-CM | POA: Diagnosis not present

## 2021-11-08 DIAGNOSIS — M5412 Radiculopathy, cervical region: Secondary | ICD-10-CM | POA: Diagnosis not present

## 2021-11-08 DIAGNOSIS — M75121 Complete rotator cuff tear or rupture of right shoulder, not specified as traumatic: Secondary | ICD-10-CM | POA: Diagnosis not present

## 2021-11-13 ENCOUNTER — Encounter: Payer: Self-pay | Admitting: Physician Assistant

## 2021-11-14 ENCOUNTER — Other Ambulatory Visit: Payer: Self-pay

## 2021-11-15 ENCOUNTER — Other Ambulatory Visit: Payer: Self-pay

## 2021-11-30 ENCOUNTER — Telehealth: Payer: Self-pay

## 2021-11-30 NOTE — Telephone Encounter (Signed)
Left vm to confirm 12/07/21 appointment-[facility] ?

## 2021-12-07 ENCOUNTER — Encounter: Payer: BC Managed Care – PPO | Admitting: Physician Assistant

## 2021-12-07 DIAGNOSIS — E785 Hyperlipidemia, unspecified: Secondary | ICD-10-CM | POA: Diagnosis not present

## 2021-12-07 DIAGNOSIS — Z79899 Other long term (current) drug therapy: Secondary | ICD-10-CM | POA: Diagnosis not present

## 2021-12-07 DIAGNOSIS — E119 Type 2 diabetes mellitus without complications: Secondary | ICD-10-CM | POA: Diagnosis not present

## 2021-12-07 DIAGNOSIS — S46011A Strain of muscle(s) and tendon(s) of the rotator cuff of right shoulder, initial encounter: Secondary | ICD-10-CM | POA: Diagnosis not present

## 2021-12-07 DIAGNOSIS — W07XXXA Fall from chair, initial encounter: Secondary | ICD-10-CM | POA: Diagnosis not present

## 2021-12-07 DIAGNOSIS — M67813 Other specified disorders of tendon, right shoulder: Secondary | ICD-10-CM | POA: Diagnosis not present

## 2021-12-07 DIAGNOSIS — R531 Weakness: Secondary | ICD-10-CM | POA: Diagnosis not present

## 2021-12-07 DIAGNOSIS — F17210 Nicotine dependence, cigarettes, uncomplicated: Secondary | ICD-10-CM | POA: Diagnosis not present

## 2021-12-07 DIAGNOSIS — Z880 Allergy status to penicillin: Secondary | ICD-10-CM | POA: Diagnosis not present

## 2021-12-07 DIAGNOSIS — S43431A Superior glenoid labrum lesion of right shoulder, initial encounter: Secondary | ICD-10-CM | POA: Diagnosis not present

## 2021-12-07 HISTORY — PX: ROTATOR CUFF REPAIR: SHX139

## 2021-12-08 DIAGNOSIS — M25511 Pain in right shoulder: Secondary | ICD-10-CM | POA: Diagnosis not present

## 2021-12-08 DIAGNOSIS — Z4789 Encounter for other orthopedic aftercare: Secondary | ICD-10-CM | POA: Diagnosis not present

## 2021-12-11 DIAGNOSIS — Z4789 Encounter for other orthopedic aftercare: Secondary | ICD-10-CM | POA: Diagnosis not present

## 2021-12-11 DIAGNOSIS — M25511 Pain in right shoulder: Secondary | ICD-10-CM | POA: Diagnosis not present

## 2021-12-13 ENCOUNTER — Encounter: Payer: Self-pay | Admitting: Physician Assistant

## 2021-12-15 DIAGNOSIS — M25511 Pain in right shoulder: Secondary | ICD-10-CM | POA: Diagnosis not present

## 2021-12-15 DIAGNOSIS — Z4789 Encounter for other orthopedic aftercare: Secondary | ICD-10-CM | POA: Diagnosis not present

## 2021-12-18 DIAGNOSIS — Z4789 Encounter for other orthopedic aftercare: Secondary | ICD-10-CM | POA: Diagnosis not present

## 2021-12-18 DIAGNOSIS — M25511 Pain in right shoulder: Secondary | ICD-10-CM | POA: Diagnosis not present

## 2021-12-22 DIAGNOSIS — Z4789 Encounter for other orthopedic aftercare: Secondary | ICD-10-CM | POA: Diagnosis not present

## 2021-12-22 DIAGNOSIS — M25511 Pain in right shoulder: Secondary | ICD-10-CM | POA: Diagnosis not present

## 2021-12-25 DIAGNOSIS — M25511 Pain in right shoulder: Secondary | ICD-10-CM | POA: Diagnosis not present

## 2021-12-25 DIAGNOSIS — Z4789 Encounter for other orthopedic aftercare: Secondary | ICD-10-CM | POA: Diagnosis not present

## 2021-12-29 DIAGNOSIS — M25511 Pain in right shoulder: Secondary | ICD-10-CM | POA: Diagnosis not present

## 2021-12-29 DIAGNOSIS — Z4789 Encounter for other orthopedic aftercare: Secondary | ICD-10-CM | POA: Diagnosis not present

## 2022-01-02 DIAGNOSIS — M25511 Pain in right shoulder: Secondary | ICD-10-CM | POA: Diagnosis not present

## 2022-01-02 DIAGNOSIS — Z4789 Encounter for other orthopedic aftercare: Secondary | ICD-10-CM | POA: Diagnosis not present

## 2022-01-05 DIAGNOSIS — Z4789 Encounter for other orthopedic aftercare: Secondary | ICD-10-CM | POA: Diagnosis not present

## 2022-01-05 DIAGNOSIS — M25511 Pain in right shoulder: Secondary | ICD-10-CM | POA: Diagnosis not present

## 2022-01-09 DIAGNOSIS — M25511 Pain in right shoulder: Secondary | ICD-10-CM | POA: Diagnosis not present

## 2022-01-09 DIAGNOSIS — Z4789 Encounter for other orthopedic aftercare: Secondary | ICD-10-CM | POA: Diagnosis not present

## 2022-01-12 DIAGNOSIS — M25511 Pain in right shoulder: Secondary | ICD-10-CM | POA: Diagnosis not present

## 2022-01-12 DIAGNOSIS — Z4789 Encounter for other orthopedic aftercare: Secondary | ICD-10-CM | POA: Diagnosis not present

## 2022-01-15 DIAGNOSIS — M25511 Pain in right shoulder: Secondary | ICD-10-CM | POA: Diagnosis not present

## 2022-01-15 DIAGNOSIS — Z4789 Encounter for other orthopedic aftercare: Secondary | ICD-10-CM | POA: Diagnosis not present

## 2022-01-16 ENCOUNTER — Ambulatory Visit: Payer: Self-pay | Admitting: Family Medicine

## 2022-01-19 DIAGNOSIS — Z4789 Encounter for other orthopedic aftercare: Secondary | ICD-10-CM | POA: Diagnosis not present

## 2022-01-19 DIAGNOSIS — M25511 Pain in right shoulder: Secondary | ICD-10-CM | POA: Diagnosis not present

## 2022-01-23 DIAGNOSIS — M25511 Pain in right shoulder: Secondary | ICD-10-CM | POA: Diagnosis not present

## 2022-01-23 DIAGNOSIS — Z4789 Encounter for other orthopedic aftercare: Secondary | ICD-10-CM | POA: Diagnosis not present

## 2022-01-26 DIAGNOSIS — Z4789 Encounter for other orthopedic aftercare: Secondary | ICD-10-CM | POA: Diagnosis not present

## 2022-01-26 DIAGNOSIS — M25511 Pain in right shoulder: Secondary | ICD-10-CM | POA: Diagnosis not present

## 2022-01-29 DIAGNOSIS — M25511 Pain in right shoulder: Secondary | ICD-10-CM | POA: Diagnosis not present

## 2022-01-29 DIAGNOSIS — Z4789 Encounter for other orthopedic aftercare: Secondary | ICD-10-CM | POA: Diagnosis not present

## 2022-02-09 DIAGNOSIS — Z4789 Encounter for other orthopedic aftercare: Secondary | ICD-10-CM | POA: Diagnosis not present

## 2022-02-09 DIAGNOSIS — M25511 Pain in right shoulder: Secondary | ICD-10-CM | POA: Diagnosis not present

## 2022-02-15 ENCOUNTER — Other Ambulatory Visit: Payer: Self-pay

## 2022-02-15 ENCOUNTER — Ambulatory Visit (INDEPENDENT_AMBULATORY_CARE_PROVIDER_SITE_OTHER): Payer: BC Managed Care – PPO | Admitting: Physician Assistant

## 2022-02-15 ENCOUNTER — Encounter: Payer: Self-pay | Admitting: Physician Assistant

## 2022-02-15 VITALS — BP 136/92 | HR 90 | Temp 97.7°F | Resp 16 | Ht 73.0 in | Wt 342.0 lb

## 2022-02-15 DIAGNOSIS — M25511 Pain in right shoulder: Secondary | ICD-10-CM

## 2022-02-15 DIAGNOSIS — Z1212 Encounter for screening for malignant neoplasm of rectum: Secondary | ICD-10-CM

## 2022-02-15 DIAGNOSIS — G629 Polyneuropathy, unspecified: Secondary | ICD-10-CM | POA: Diagnosis not present

## 2022-02-15 DIAGNOSIS — E1140 Type 2 diabetes mellitus with diabetic neuropathy, unspecified: Secondary | ICD-10-CM

## 2022-02-15 DIAGNOSIS — Z0001 Encounter for general adult medical examination with abnormal findings: Secondary | ICD-10-CM

## 2022-02-15 DIAGNOSIS — G4710 Hypersomnia, unspecified: Secondary | ICD-10-CM | POA: Diagnosis not present

## 2022-02-15 DIAGNOSIS — Z794 Long term (current) use of insulin: Secondary | ICD-10-CM

## 2022-02-15 DIAGNOSIS — Z4789 Encounter for other orthopedic aftercare: Secondary | ICD-10-CM | POA: Diagnosis not present

## 2022-02-15 DIAGNOSIS — R1011 Right upper quadrant pain: Secondary | ICD-10-CM

## 2022-02-15 DIAGNOSIS — R300 Dysuria: Secondary | ICD-10-CM

## 2022-02-15 DIAGNOSIS — Z1211 Encounter for screening for malignant neoplasm of colon: Secondary | ICD-10-CM

## 2022-02-15 DIAGNOSIS — R030 Elevated blood-pressure reading, without diagnosis of hypertension: Secondary | ICD-10-CM | POA: Diagnosis not present

## 2022-02-15 DIAGNOSIS — R5383 Other fatigue: Secondary | ICD-10-CM

## 2022-02-15 LAB — POCT GLYCOSYLATED HEMOGLOBIN (HGB A1C): Hemoglobin A1C: 6.1 % — AB (ref 4.0–5.6)

## 2022-02-15 MED ORDER — GABAPENTIN 300 MG PO CAPS
ORAL_CAPSULE | ORAL | 1 refills | Status: DC
  Filled 2022-02-15: qty 120, 30d supply, fill #0
  Filled 2022-04-17: qty 120, 30d supply, fill #1

## 2022-02-15 MED ORDER — TRULICITY 1.5 MG/0.5ML ~~LOC~~ SOAJ
1.5000 mg | SUBCUTANEOUS | 3 refills | Status: DC
  Filled 2022-02-15: qty 2, 28d supply, fill #0

## 2022-02-15 MED ORDER — MELOXICAM 7.5 MG PO TABS
ORAL_TABLET | Freq: Every day | ORAL | 3 refills | Status: DC
  Filled 2022-02-15: qty 30, 30d supply, fill #0
  Filled 2022-04-17: qty 30, 30d supply, fill #1
  Filled 2022-06-18: qty 30, 30d supply, fill #2
  Filled 2022-08-09: qty 30, 30d supply, fill #3

## 2022-02-15 NOTE — Progress Notes (Signed)
[facility] [place], [place] [address]  Internal MEDICINE  Office Visit Note  Patient Name: Victor Hale  [id number]  [id number]  Date of Service: 02/20/2022  Chief Complaint  Patient presents with   Annual Exam   Hypertension   Diabetes    Diabetes has been causing problems will check A1C    Abdominal Pain    Upper abdominal pain - started after having procedure    Medication Refill    Needs all meds refilled      HPI Pt is here for routine health maintenance examination -Had right rotator cuff surgery and is in PT now. Has office visit on the 26th.  -Did not get labs done or sleep study. Pt promised he will gets labs done and understands to be fasting and will go tomorrow morning. He also will call back to schedule sleep study as he says he missed his first one and needs to reschedule it. Discussed the importance of this again and how going untreated can cause many other health problems. -A little more depressed with change in activity and not being able to work right now, but is managing. -Some abdominal pain since his shoulder surgery. Seems to be RUQ and epigastric region and discuss checking labs and ordering abdominal US for further investigation. He does have a hx of kidney stones many years ago -he does also have increased urination recently, no burning. Will check urine today.  -Due for colonoscopy and if continued abdominal pain may discuss with GI as well -BP initially elevated, but did improve greatly. Will need to keep monitoring -Does need med refills today  Current Medication: Outpatient Encounter Medications as of 02/15/2022  Medication Sig   atorvastatin (LIPITOR) 20 MG tablet TAKE 1 TABLET (20 MG TOTAL) BY MOUTH DAILY.   Blood Glucose Monitoring Suppl (TRUE METRIX METER) DEVI 1 each by Does not apply route daily before breakfast.   glucose blood (TRUE METRIX BLOOD GLUCOSE TEST) test strip Use daily before breakfast   Insulin Pen  Needle 31G X 5 MM MISC 1 each by Does not apply route at bedtime.   sildenafil (VIAGRA) 50 MG tablet TAKE 1 TABLET (50 MG TOTAL) BY MOUTH DAILY AS NEEDED FOR ERECTILE DYSFUNCTION. AT LEAST 24 HOURS BETWEEN DOSES   TRUEplus Lancets 28G MISC 1 each by Does not apply route 3 (three) times daily before meals.   [DISCONTINUED] Dulaglutide (TRULICITY) 1.5 GE/3.6OQ SOPN INJECT 1.5 MG INTO THE SKIN ONCE A WEEK.   [DISCONTINUED] gabapentin (NEURONTIN) 300 MG capsule Take 1 capsule by mouth in AM and 2-3 caps by mouth at night before bed   [DISCONTINUED] meloxicam (MOBIC) 7.5 MG tablet TAKE 1 TABLET (7.5 MG TOTAL) BY MOUTH DAILY.   [DISCONTINUED] tamsulosin (FLOMAX) 0.4 MG CAPS capsule Take 1 capsule (0.4 mg total) by mouth daily.   Dulaglutide (TRULICITY) 1.5 HU/7.6LY SOPN INJECT 1.5 MG INTO THE SKIN ONCE A WEEK.   gabapentin (NEURONTIN) 300 MG capsule Take 1 capsule by mouth in AM and 2-3 caps by mouth at night before bed   meloxicam (MOBIC) 7.5 MG tablet TAKE 1 TABLET (7.5 MG TOTAL) BY MOUTH DAILY.   [DISCONTINUED] albuterol (VENTOLIN HFA) 108 (90 Base) MCG/ACT inhaler INHALE 2 PUFFS INTO THE LUNGS EVERY 6 (SIX) HOURS AS NEEDED FOR WHEEZING OR SHORTNESS OF BREATH.   [DISCONTINUED] dicyclomine (BENTYL) 20 MG tablet Take 1 tablet (20 mg total) by mouth 2 (two) times daily. (Patient not taking: Reported on 07/01/2017)   [DISCONTINUED] famotidine (  PEPCID) 20 MG tablet Take 1 tablet (20 mg total) by mouth 2 (two) times daily. (Patient not taking: Reported on 07/01/2017)   [DISCONTINUED] omeprazole (PRILOSEC) 20 MG capsule Take 1 capsule (20 mg total) by mouth daily. (Patient not taking: Reported on 07/01/2017)   No facility-administered encounter medications on file as of 02/15/2022.    Surgical History: History reviewed. No pertinent surgical history.  Medical History: Past Medical History:  Diagnosis Date   Diabetes mellitus without complication ([place])    Fatty liver    Hypertension     Family  History: Family History  Problem Relation Age of Onset   Hypertension Mother    Diabetes Mother       Review of Systems  Constitutional:  Positive for fatigue. Negative for chills and unexpected weight change.  HENT:  Negative for congestion, postnasal drip, rhinorrhea, sneezing and sore throat.   Eyes:  Negative for redness.  Respiratory:  Negative for cough, chest tightness and shortness of breath.   Cardiovascular:  Negative for chest pain and palpitations.  Gastrointestinal:  Positive for abdominal pain. Negative for constipation, diarrhea, nausea and vomiting.  Genitourinary:  Positive for frequency. Negative for dysuria and flank pain.  Musculoskeletal:  Positive for arthralgias. Negative for back pain, joint swelling and neck pain.  Skin:  Negative for rash.  Neurological:  Positive for numbness. Negative for tremors.  Hematological:  Negative for adenopathy. Does not bruise/bleed easily.  Psychiatric/Behavioral:  Positive for sleep disturbance. Negative for behavioral problems (Depression) and suicidal ideas. The patient is not nervous/anxious.      Vital Signs: BP (!) 136/92 Comment: 151/124  Pulse 90   Temp 97.7 F (36.5 C)   Resp 16   Ht '6\' 1"'$  (1.854 m)   Wt (!) 342 lb (155.1 kg)   SpO2 95%   BMI 45.12 kg/m    Physical Exam Vitals and nursing note reviewed.  Constitutional:      General: He is not in acute distress.    Appearance: He is well-developed. He is obese. He is not diaphoretic.  HENT:     Head: Normocephalic and atraumatic.     Mouth/Throat:     Pharynx: No oropharyngeal exudate.  Eyes:     Pupils: Pupils are equal, round, and reactive to light.  Neck:     Thyroid: No thyromegaly.     Vascular: No JVD.     Trachea: No tracheal deviation.  Cardiovascular:     Rate and Rhythm: Normal rate and regular rhythm.     Heart sounds: Normal heart sounds. No murmur heard.    No friction rub. No gallop.  Pulmonary:     Effort: Pulmonary effort is  normal. No respiratory distress.     Breath sounds: No wheezing or rales.  Chest:     Chest wall: No tenderness.  Abdominal:     General: Bowel sounds are normal.     Palpations: Abdomen is soft.     Tenderness: There is abdominal tenderness in the right upper quadrant and epigastric area. There is no right CVA tenderness or left CVA tenderness.  Musculoskeletal:     Cervical back: Normal range of motion and neck supple.     Comments: S/p right rotator cuff surgery  Lymphadenopathy:     Cervical: No cervical adenopathy.  Skin:    General: Skin is warm and dry.  Neurological:     Mental Status: He is alert and oriented to person, place, and time.     Cranial  Nerves: No cranial nerve deficit.  Psychiatric:        Behavior: Behavior normal.        Thought Content: Thought content normal.        Judgment: Judgment normal.      LABS: Recent Results (from the past 2160 hour(s))  Microalbumin, urine     Status: None   Collection Time: 02/15/22 12:00 AM  Result Value Ref Range   Microalbumin, Urine 36.6 Not Estab. ug/mL  UA/M w/rflx Culture, Routine     Status: None   Collection Time: 02/15/22 12:00 AM   Urine  Result Value Ref Range   Specific Gravity, UA 1.019 1.005 - 1.030   pH, UA 5.5 5.0 - 7.5   Color, UA Yellow Yellow   Appearance Ur Clear Clear   Leukocytes,UA Negative Negative   Protein,UA Negative Negative/Trace   Glucose, UA Negative Negative   Ketones, UA Negative Negative   RBC, UA Negative Negative   Bilirubin, UA Negative Negative   Urobilinogen, Ur 0.2 0.2 - 1.0 mg/dL   Nitrite, UA Negative Negative   Microscopic Examination Comment     Comment: Microscopic follows if indicated.   Microscopic Examination See below:     Comment: Microscopic was indicated and was performed.   Urinalysis Reflex Comment     Comment: This specimen has reflexed to a Urine Culture.  Microscopic Examination     Status: Abnormal   Collection Time: 02/15/22 12:00 AM   Urine   Result Value Ref Range   WBC, UA 6-10 (A) 0 - 5 /hpf   RBC, Urine 0-2 0 - 2 /hpf   Epithelial Cells (non renal) None seen 0 - 10 /hpf   Casts None seen None seen /lpf   Bacteria, UA None seen None seen/Few  Urine Culture, Reflex     Status: None   Collection Time: 02/15/22 12:00 AM   Urine  Result Value Ref Range   Urine Culture, Routine Final report    Organism ID, Bacteria No growth   POCT HgB A1C     Status: Abnormal   Collection Time: 02/15/22  3:15 PM  Result Value Ref Range   Hemoglobin A1C 6.1 (A) 4.0 - 5.6 %   HbA1c POC (<> result, manual entry)     HbA1c, POC (prediabetic range)     HbA1c, POC (controlled diabetic range)          Assessment/Plan: 1. Encounter for general adult medical examination with abnormal findings Cpe performed, labs reordered and pt advised to go tomorrow morning while fasting. Due for colonoscopy - Lipid Panel With LDL/HDL Ratio - TSH + free T4 - Comprehensive metabolic panel - CBC w/Diff/Platelet  2. Type 2 diabetes mellitus with diabetic neuropathy, without long-term current use of insulin (HCC) - POCT HgB A1C is 6.1, will continue current medication and work on diet and exercise - Dulaglutide (TRULICITY) 1.5 XB/3.5HG SOPN; INJECT 1.5 MG INTO THE SKIN ONCE A WEEK.  Dispense: 2 mL; Refill: 3 - Microalbumin, urine  3. Elevated BP without diagnosis of hypertension Will continue to monitor  4. Hypersomnia Pt will call to reschedule missed sleep study. It is very important he have this done as he has excessive daytime sleepiness and is now also having rising BP  5. Neuropathy May continue gabapentin as before - gabapentin (NEURONTIN) 300 MG capsule; Take 1 capsule by mouth in AM and 2-3 caps by mouth at night before bed  Dispense: 120 capsule; Refill: 1  6. Acute pain of  right shoulder Followed by ortho - meloxicam (MOBIC) 7.5 MG tablet; TAKE 1 TABLET (7.5 MG TOTAL) BY MOUTH DAILY.  Dispense: 30 tablet; Refill: 3  7. Screening for  colorectal cancer - Ambulatory referral to Gastroenterology  8. Right upper quadrant abdominal pain Will check labs and will order [place] of abdomen. Pt advised to go to ED if acute worsening or new symptoms arise. Pt is due for colonoscopy and will have this done and may discuss further with GI if indicated - Lipid Panel With LDL/HDL Ratio - Comprehensive metabolic panel - CBC w/Diff/Platelet - Lipase - US Abdomen Complete; Future  9. Other fatigue - Lipid Panel With LDL/HDL Ratio - TSH + free T4 - Comprehensive metabolic panel - CBC w/Diff/Platelet - Lipase  10. Dysuria - UA/M w/rflx Culture, Routine   General Counseling: Victor Hale understanding of the findings of todays visit and agrees with plan of treatment. I have discussed any further diagnostic evaluation that may be needed or ordered today. We also reviewed his medications today. he has been encouraged to call the office with any questions or concerns that should arise related to todays visit.    Counseling:    Orders Placed This Encounter  Procedures   Microscopic Examination   Urine Culture, Reflex   US Abdomen Complete   UA/M w/rflx Culture, Routine   Lipid Panel With LDL/HDL Ratio   TSH + free T4   Comprehensive metabolic panel   CBC w/Diff/Platelet   Lipase   Microalbumin, urine   UA/M w/rflx Culture, Routine   Ambulatory referral to Gastroenterology   POCT HgB A1C    Meds ordered this encounter  Medications   Dulaglutide (TRULICITY) 1.5 PQ/9.8YM SOPN    Sig: INJECT 1.5 MG INTO THE SKIN ONCE A WEEK.    Dispense:  2 mL    Refill:  3   gabapentin (NEURONTIN) 300 MG capsule    Sig: Take 1 capsule by mouth in AM and 2-3 caps by mouth at night before bed    Dispense:  120 capsule    Refill:  1    Dose increase   meloxicam (MOBIC) 7.5 MG tablet    Sig: TAKE 1 TABLET (7.5 MG TOTAL) BY MOUTH DAILY.    Dispense:  30 tablet    Refill:  3    This patient was seen by Drema Dallas, PA-C in  collaboration with Dr. Clayborn Bigness as a part of collaborative care agreement.  Total time spent:40 Minutes  Time spent includes review of chart, medications, test results, and follow up plan with the patient.     Lavera Guise, MD  Internal Medicine

## 2022-02-16 ENCOUNTER — Telehealth: Payer: Self-pay

## 2022-02-16 ENCOUNTER — Other Ambulatory Visit: Payer: Self-pay

## 2022-02-16 DIAGNOSIS — Z1211 Encounter for screening for malignant neoplasm of colon: Secondary | ICD-10-CM

## 2022-02-16 MED ORDER — GOLYTELY 236 G PO SOLR: ORAL | 0 refills | Status: AC

## 2022-02-16 NOTE — Telephone Encounter (Signed)
Gastroenterology Pre-Procedure Review  Request Date: 02/28/22 Requesting Physician: Dr. Vicente Males  PATIENT REVIEW QUESTIONS: The patient responded to the following health history questions as indicated:    1. Are you having any GI issues? yes (stomach ache feels like stomach is in a knot, constipation) 2. Do you have a personal history of Polyps? no 3. Do you have a family history of Colon Cancer or Polyps? no 4. Diabetes Mellitus? yes (type 2) 5. Joint replacements in the past 12 months?yes (rotator cuff surgery May 4th 2023) 6. Major health problems in the past 3 months?no 7. Any artificial heart valves, MVP, or defibrillator?no    MEDICATIONS & ALLERGIES:    Patient reports the following regarding taking any anticoagulation/antiplatelet therapy:   Plavix, Coumadin, Eliquis, Xarelto, Lovenox, Pradaxa, Brilinta, or Effient? no Aspirin? no  Patient confirms/reports the following medications:  Current Outpatient Medications  Medication Sig Dispense Refill   atorvastatin (LIPITOR) 20 MG tablet TAKE 1 TABLET (20 MG TOTAL) BY MOUTH DAILY. 30 tablet 6   Blood Glucose Monitoring Suppl (TRUE METRIX METER) DEVI 1 each by Does not apply route daily before breakfast. 100 each 12   Dulaglutide (TRULICITY) 1.5 TR/7.1HA SOPN INJECT 1.5 MG INTO THE SKIN ONCE A WEEK. 2 mL 3   gabapentin (NEURONTIN) 300 MG capsule Take 1 capsule by mouth in AM and 2-3 caps by mouth at night before bed 120 capsule 1   glucose blood (TRUE METRIX BLOOD GLUCOSE TEST) test strip Use daily before breakfast 100 each 12   Insulin Pen Needle 31G X 5 MM MISC 1 each by Does not apply route at bedtime. 30 each 5   meloxicam (MOBIC) 7.5 MG tablet TAKE 1 TABLET (7.5 MG TOTAL) BY MOUTH DAILY. 30 tablet 3   sildenafil (VIAGRA) 50 MG tablet TAKE 1 TABLET (50 MG TOTAL) BY MOUTH DAILY AS NEEDED FOR ERECTILE DYSFUNCTION. AT LEAST 24 HOURS BETWEEN DOSES 10 tablet 3   TRUEplus Lancets 28G MISC 1 each by Does not apply route 3 (three) times  daily before meals. 100 each 12   No current facility-administered medications for this visit.    Patient confirms/reports the following allergies:  Allergies  Allergen Reactions   Penicillins Itching    No orders of the defined types were placed in this encounter.   AUTHORIZATION INFORMATION Primary Insurance: 1D#: Group #:  Secondary Insurance: 1D#: Group #:  SCHEDULE INFORMATION: Date: 02/28/22 Time: Location: [place]

## 2022-02-18 LAB — MICROSCOPIC EXAMINATION
Bacteria, UA: NONE SEEN
Casts: NONE SEEN /lpf
Epithelial Cells (non renal): NONE SEEN /hpf (ref 0–10)

## 2022-02-18 LAB — UA/M W/RFLX CULTURE, ROUTINE
Bilirubin, UA: NEGATIVE
Glucose, UA: NEGATIVE
Ketones, UA: NEGATIVE
Leukocytes,UA: NEGATIVE
Nitrite, UA: NEGATIVE
Protein,UA: NEGATIVE
RBC, UA: NEGATIVE
Specific Gravity, UA: 1.019 (ref 1.005–1.030)
Urobilinogen, Ur: 0.2 mg/dL (ref 0.2–1.0)
pH, UA: 5.5 (ref 5.0–7.5)

## 2022-02-18 LAB — URINE CULTURE, REFLEX: Organism ID, Bacteria: NO GROWTH

## 2022-02-18 LAB — MICROALBUMIN, URINE: Microalbumin, Urine: 36.6 ug/mL

## 2022-02-23 ENCOUNTER — Ambulatory Visit: Admission: RE | Admit: 2022-02-23 | Payer: BC Managed Care – PPO | Source: Ambulatory Visit

## 2022-02-23 DIAGNOSIS — Z4789 Encounter for other orthopedic aftercare: Secondary | ICD-10-CM | POA: Diagnosis not present

## 2022-02-23 DIAGNOSIS — M25511 Pain in right shoulder: Secondary | ICD-10-CM | POA: Diagnosis not present

## 2022-02-27 ENCOUNTER — Encounter: Payer: Self-pay | Admitting: Gastroenterology

## 2022-02-27 DIAGNOSIS — M25511 Pain in right shoulder: Secondary | ICD-10-CM | POA: Diagnosis not present

## 2022-02-27 DIAGNOSIS — Z4789 Encounter for other orthopedic aftercare: Secondary | ICD-10-CM | POA: Diagnosis not present

## 2022-02-28 ENCOUNTER — Ambulatory Visit: Payer: BC Managed Care – PPO | Admitting: Anesthesiology

## 2022-02-28 ENCOUNTER — Ambulatory Visit
Admission: RE | Admit: 2022-02-28 | Discharge: 2022-02-28 | Disposition: A | Discharge status: Home / Self Care | Payer: BC Managed Care – PPO | Attending: Gastroenterology | Admitting: Gastroenterology

## 2022-02-28 ENCOUNTER — Encounter: Payer: Self-pay | Admitting: Gastroenterology

## 2022-02-28 ENCOUNTER — Encounter
Admission: RE | Disposition: A | Discharge status: Home / Self Care | Payer: Self-pay | Source: Home / Self Care | Attending: Gastroenterology

## 2022-02-28 DIAGNOSIS — Z1211 Encounter for screening for malignant neoplasm of colon: Secondary | ICD-10-CM | POA: Diagnosis not present

## 2022-02-28 DIAGNOSIS — D123 Benign neoplasm of transverse colon: Secondary | ICD-10-CM | POA: Diagnosis not present

## 2022-02-28 DIAGNOSIS — K635 Polyp of colon: Secondary | ICD-10-CM | POA: Diagnosis not present

## 2022-02-28 DIAGNOSIS — I10 Essential (primary) hypertension: Secondary | ICD-10-CM | POA: Diagnosis not present

## 2022-02-28 DIAGNOSIS — Z6836 Body mass index (BMI) 36.0-36.9, adult: Secondary | ICD-10-CM | POA: Insufficient documentation

## 2022-02-28 DIAGNOSIS — E119 Type 2 diabetes mellitus without complications: Secondary | ICD-10-CM | POA: Insufficient documentation

## 2022-02-28 DIAGNOSIS — D126 Benign neoplasm of colon, unspecified: Secondary | ICD-10-CM | POA: Diagnosis not present

## 2022-02-28 HISTORY — PX: COLONOSCOPY WITH PROPOFOL: SHX5780

## 2022-02-28 LAB — GLUCOSE, CAPILLARY: Glucose-Capillary: 97 mg/dL (ref 70–99)

## 2022-02-28 SURGERY — COLONOSCOPY WITH PROPOFOL
Anesthesia: General

## 2022-02-28 MED ORDER — PROPOFOL 1000 MG/100ML IV EMUL
INTRAVENOUS | Status: AC
  Filled 2022-02-28: qty 100

## 2022-02-28 MED ORDER — PROPOFOL 10 MG/ML IV BOLUS
INTRAVENOUS | Status: DC | PRN
  Administered 2022-02-28: 50 mg via INTRAVENOUS

## 2022-02-28 MED ORDER — KETOROLAC TROMETHAMINE 30 MG/ML IJ SOLN
INTRAMUSCULAR | Status: AC
  Filled 2022-02-28: qty 1

## 2022-02-28 MED ORDER — DEXMEDETOMIDINE HCL IN NACL 80 MCG/20ML IV SOLN
INTRAVENOUS | Status: AC
  Filled 2022-02-28: qty 20

## 2022-02-28 MED ORDER — DEXMEDETOMIDINE HCL IN NACL 200 MCG/50ML IV SOLN
INTRAVENOUS | Status: DC | PRN
  Administered 2022-02-28 (×3): 20 ug via INTRAVENOUS

## 2022-02-28 MED ORDER — ONDANSETRON HCL 4 MG/2ML IJ SOLN
INTRAMUSCULAR | Status: AC
  Filled 2022-02-28: qty 2

## 2022-02-28 MED ORDER — ESMOLOL HCL 100 MG/10ML IV SOLN
INTRAVENOUS | Status: AC
  Filled 2022-02-28: qty 10

## 2022-02-28 MED ORDER — PROPOFOL 500 MG/50ML IV EMUL
INTRAVENOUS | Status: DC | PRN
  Administered 2022-02-28: 100 ug/kg/min via INTRAVENOUS

## 2022-02-28 MED ORDER — MIDAZOLAM HCL 2 MG/2ML IJ SOLN
INTRAMUSCULAR | Status: AC
  Filled 2022-02-28: qty 2

## 2022-02-28 MED ORDER — MIDAZOLAM HCL 2 MG/2ML IJ SOLN
INTRAMUSCULAR | Status: DC | PRN
  Administered 2022-02-28: 2 mg via INTRAVENOUS

## 2022-02-28 MED ORDER — LIDOCAINE HCL (CARDIAC) PF 100 MG/5ML IV SOSY
PREFILLED_SYRINGE | INTRAVENOUS | Status: DC | PRN
  Administered 2022-02-28: 50 mg via INTRAVENOUS

## 2022-02-28 MED ORDER — SODIUM CHLORIDE 0.9 % IV SOLN: INTRAVENOUS | Status: DC

## 2022-02-28 NOTE — Op Note (Signed)
[facility] Gastroenterology Patient Name: Victor Hale Procedure Date: 02/28/2022 12:51 PM MRN: [id number] Account #: [id number] Date of Birth: 1976/02/09 Admit Type: Outpatient Age: 46 Room: [facility] ENDO ROOM 2 Gender: Male Note Status: Finalized Instrument Name: Jasper Riling [id number] Procedure:             Colonoscopy Indications:           Screening for colorectal malignant neoplasm Providers:             Jonathon Bellows MD, MD Referring MD:          Drema Dallas, PA-C Medicines:             Monitored Anesthesia Care Complications:         No immediate complications. Procedure:             Pre-Anesthesia Assessment:                        - Prior to the procedure, a History and Physical was                         performed, and patient medications, allergies and                         sensitivities were reviewed. The patient's tolerance                         of previous anesthesia was reviewed.                        - The risks and benefits of the procedure and the                         sedation options and risks were discussed with the                         patient. All questions were answered and informed                         consent was obtained.                        - ASA Grade Assessment: II - A patient with mild                         systemic disease.                        After obtaining informed consent, the colonoscope was                         passed under direct vision. Throughout the procedure,                         the patient's blood pressure, pulse, and oxygen                         saturations were monitored continuously. The                         Colonoscope was introduced through the  anus and                         advanced to the the cecum, identified by the                         appendiceal orifice. The patient tolerated the                         procedure well. The quality of the bowel preparation                          was excellent. The colonoscopy was technically                         difficult and complex due to the patient's agitation.                         Successful completion of the procedure was aided by                         Anesthesia staff assisting with sedation. Findings:      The perianal and digital rectal examinations were normal.      A 5 mm polyp was found in the transverse colon. The polyp was sessile.       The polyp was removed with a cold snare. Resection and retrieval were       complete.      The exam was otherwise without abnormality on direct and retroflexion       views. Impression:            - One 5 mm polyp in the transverse colon, removed with                         a cold snare. Resected and retrieved.                        - The examination was otherwise normal on direct and                         retroflexion views. Recommendation:        - Discharge patient to home (with escort).                        - Resume previous diet.                        - Continue present medications.                        - Await pathology results.                        - Repeat colonoscopy for surveillance based on                         pathology results. Procedure Code(s):     --- Professional ---                        [phone], Colonoscopy, flexible; with removal  of                         tumor(s), polyp(s), or other lesion(s) by snare                         technique Diagnosis Code(s):     --- Professional ---                        Z12.11, Encounter for screening for malignant neoplasm                         of colon                        K63.5, Polyp of colon CPT copyright 2019 American Medical Association. All rights reserved. The codes documented in this report are preliminary and upon coder review may  be revised to meet current compliance requirements. Jonathon Bellows, MD Jonathon Bellows MD, MD 02/28/2022 1:32:36 PM This report has been signed  electronically. Number of Addenda: 0 Note Initiated On: 02/28/2022 12:51 PM Scope Withdrawal Time: 0 hours 11 minutes 27 seconds  Total Procedure Duration: 0 hours 19 minutes 37 seconds  Estimated Blood Loss:  Estimated blood loss: none.      [facility]

## 2022-02-28 NOTE — H&P (Signed)
Victor Bellows, MD [address], [place], [place], [place], [address] [address], [place], [place], [place], [address] Phone: [phone]  Fax: [phone]  Primary Care Physician:  Mylinda Latina, PA-C   Pre-Procedure History & Physical: HPI:  Victor Hale is a 46 y.o. male is here for an colonoscopy.   Past Medical History:  Diagnosis Date   Diabetes mellitus without complication ([place])    Fatty liver    Hypertension     Past Surgical History:  Procedure Laterality Date   ROTATOR CUFF REPAIR Right 12/07/2021    Prior to Admission medications   Medication Sig Start Date End Date Taking? Authorizing Provider  atorvastatin (LIPITOR) 20 MG tablet TAKE 1 TABLET (20 MG TOTAL) BY MOUTH DAILY. 10/19/21 10/19/22  McDonough, Si Gaul, PA-C  Blood Glucose Monitoring Suppl (TRUE METRIX METER) DEVI 1 each by Does not apply route daily before breakfast. 06/23/19   Charlott Rakes, MD  Dulaglutide (TRULICITY) 1.5 VH/8.4ON SOPN INJECT 1.5 MG INTO THE SKIN ONCE A WEEK. 02/15/22 02/15/23  McDonough, Si Gaul, PA-C  gabapentin (NEURONTIN) 300 MG capsule Take 1 capsule by mouth in AM and 2-3 caps by mouth at night before bed 02/15/22   McDonough, Lauren K, PA-C  glucose blood (TRUE METRIX BLOOD GLUCOSE TEST) test strip Use daily before breakfast 08/09/20   Charlott Rakes, MD  Insulin Pen Needle 31G X 5 MM MISC 1 each by Does not apply route at bedtime. 10/26/19   Charlott Rakes, MD  meloxicam (MOBIC) 7.5 MG tablet TAKE 1 TABLET (7.5 MG TOTAL) BY MOUTH DAILY. 02/15/22 02/15/23  McDonough, Si Gaul, PA-C  polyethylene glycol (GOLYTELY) 236 g solution Evening before colonoscopy at 5 pm fill container with water.  Mix well. Drink 8 oz every 35mn until half contents have been completed.  Continue clear liquid diet. 5 hours prior to colonoscopy resume drinking prep. 8oz every 15 mins until entire contents have been completed.  Do not eat or drink anything 2 hours prior to colonoscopy. 02/16/22    AJonathon Bellows MD  sildenafil (VIAGRA) 50 MG tablet TAKE 1 TABLET (50 MG TOTAL) BY MOUTH DAILY AS NEEDED FOR ERECTILE DYSFUNCTION. AT LEAST 24 HOURS BETWEEN DOSES 09/19/21 09/19/22  NCharlott Rakes MD  TRUEplus Lancets 28G MISC 1 each by Does not apply route 3 (three) times daily before meals. 06/23/19   NCharlott Rakes MD  dicyclomine (BENTYL) 20 MG tablet Take 1 tablet (20 mg total) by mouth 2 (two) times daily. Patient not taking: Reported on 07/01/2017 10/12/14 01/31/19  DWaynetta Pean PA-C  famotidine (PEPCID) 20 MG tablet Take 1 tablet (20 mg total) by mouth 2 (two) times daily. Patient not taking: Reported on 07/01/2017 10/22/15 01/31/19  WEarleen Newport MD  omeprazole (PRILOSEC) 20 MG capsule Take 1 capsule (20 mg total) by mouth daily. Patient not taking: Reported on 07/01/2017 10/12/14 01/31/19  DWaynetta Pean PA-C    Allergies as of 02/16/2022 - Review Complete 02/15/2022  Allergen Reaction Noted   Penicillins Itching 12/20/2014    Family History  Problem Relation Age of Onset   Hypertension Mother    Diabetes Mother     Social History   Socioeconomic History   Marital status: Married    Spouse name: Not on file   Number of children: Not on file   Years of education: Not on file   Highest education level: Not on file  Occupational History   Not on file  Tobacco Use   Smoking status:  Never   Smokeless tobacco: Never  Vaping Use   Vaping Use: Never used  Substance and Sexual Activity   Alcohol use: No   Drug use: Yes    Frequency: 6.0 times per week    Types: Marijuana   Sexual activity: Yes  Other Topics Concern   Not on file  Social History Narrative   Not on file   Social Determinants of Health   Financial Resource Strain: Not on file  Food Insecurity: Not on file  Transportation Needs: Not on file  Physical Activity: Not on file  Stress: Not on file  Social Connections: Not on file  Intimate Partner Violence: Not on file    Review of  Systems: See HPI, otherwise negative ROS  Physical Exam: BP (!) 128/101   Pulse 75   Temp (!) 97.2 F (36.2 C) (Temporal)   Resp 20   Ht '6\' 2"'$  (1.88 m)   Wt 130.2 kg   SpO2 99%   BMI 36.85 kg/m  General:   Alert,  pleasant and cooperative in NAD Head:  Normocephalic and atraumatic. Neck:  Supple; no masses or thyromegaly. Lungs:  Clear throughout to auscultation, normal respiratory effort.    Heart:  +S1, +S2, Regular rate and rhythm, No edema. Abdomen:  Soft, nontender and nondistended. Normal bowel sounds, without guarding, and without rebound.   Neurologic:  Alert and  oriented x4;  grossly normal neurologically.  Impression/Plan: Victor Hale is here for an colonoscopy to be performed for Screening colonoscopy average risk   Risks, benefits, limitations, and alternatives regarding  colonoscopy have been reviewed with the patient.  Questions have been answered.  All parties agreeable.   Victor Bellows, MD  02/28/2022, 12:52 PM

## 2022-02-28 NOTE — Transfer of Care (Signed)
Immediate Anesthesia Transfer of Care Note  Patient: Victor Hale  Procedure(s) Performed: COLONOSCOPY WITH PROPOFOL  Patient Location: PACU  Anesthesia Type:General  Level of Consciousness: sedated  Airway & Oxygen Therapy: Patient Spontanous Breathing  Post-op Assessment: Report given to RN and Post -op Vital signs reviewed and stable  Post vital signs: Reviewed and stable  Last Vitals:  Vitals Value Taken Time  BP 141/75 02/28/22 1337  Temp 36.8 C 02/28/22 1335  Pulse 78 02/28/22 1338  Resp 26 02/28/22 1338  SpO2 94 % 02/28/22 1338  Vitals shown include unvalidated device data.  Last Pain:  Vitals:   02/28/22 1335  TempSrc: Temporal  PainSc: Asleep         Complications: No notable events documented.

## 2022-02-28 NOTE — Anesthesia Preprocedure Evaluation (Signed)
Anesthesia Evaluation  Patient identified by MRN, date of birth, ID band Patient awake    Reviewed: Allergy & Precautions, NPO status , Patient's Chart, lab work & pertinent test results  Airway Mallampati: II  TM Distance: >3 FB Neck ROM: Full    Dental  (+) Teeth Intact, Caps, Dental Advisory Given,    Pulmonary neg pulmonary ROS, Patient abstained from smoking.,    Pulmonary exam normal  + decreased breath sounds      Cardiovascular Exercise Tolerance: Good hypertension, Pt. on medications negative cardio ROS Normal cardiovascular exam Rhythm:Regular Rate:Normal     Neuro/Psych negative neurological ROS  negative psych ROS   GI/Hepatic negative GI ROS, Neg liver ROS,   Endo/Other  negative endocrine ROSdiabetes, Well Controlled, Type 2Morbid obesity  Renal/GU negative Renal ROS  negative genitourinary   Musculoskeletal negative musculoskeletal ROS (+)   Abdominal (+) + obese,   Peds negative pediatric ROS (+)  Hematology negative hematology ROS (+)   Anesthesia Other Findings Past Medical History: No date: Diabetes mellitus without complication (HCC) No date: Fatty liver No date: Hypertension  Past Surgical History: 12/07/2021: ROTATOR CUFF REPAIR; Right  BMI    Body Mass Index: 36.85 kg/m      Reproductive/Obstetrics negative OB ROS                             Anesthesia Physical Anesthesia Plan  ASA: 3  Anesthesia Plan: General   Post-op Pain Management:    Induction: Intravenous  PONV Risk Score and Plan: Propofol infusion and TIVA  Airway Management Planned: Natural Airway  Additional Equipment:   Intra-op Plan:   Post-operative Plan:   Informed Consent: I have reviewed the patients History and Physical, chart, labs and discussed the procedure including the risks, benefits and alternatives for the proposed anesthesia with the patient or authorized  representative who has indicated his/her understanding and acceptance.     Dental Advisory Given  Plan Discussed with: CRNA and Surgeon  Anesthesia Plan Comments:         Anesthesia Quick Evaluation

## 2022-03-01 ENCOUNTER — Encounter: Payer: Self-pay | Admitting: Gastroenterology

## 2022-03-01 ENCOUNTER — Ambulatory Visit: Payer: BC Managed Care – PPO | Admitting: Physician Assistant

## 2022-03-01 LAB — SURGICAL PATHOLOGY

## 2022-03-01 NOTE — Anesthesia Postprocedure Evaluation (Signed)
Anesthesia Post Note  Patient: Victor Hale  Procedure(s) Performed: COLONOSCOPY WITH PROPOFOL  Patient location during evaluation: PACU Anesthesia Type: General Level of consciousness: awake Pain management: satisfactory to patient Vital Signs Assessment: post-procedure vital signs reviewed and stable Respiratory status: nonlabored ventilation and respiratory function stable Cardiovascular status: stable Anesthetic complications: no   No notable events documented.   Last Vitals:  Vitals:   02/28/22 1405 02/28/22 1415  BP: 129/80 (!) 143/94  Pulse:    Resp:    Temp:    SpO2:      Last Pain:  Vitals:   02/28/22 1415  TempSrc:   PainSc: 0-No pain                 VAN STAVEREN,Anyely Cunning

## 2022-03-05 ENCOUNTER — Ambulatory Visit
Admission: RE | Admit: 2022-03-05 | Discharge: 2022-03-05 | Disposition: A | Discharge status: Home / Self Care | Payer: BC Managed Care – PPO | Source: Ambulatory Visit | Attending: Physician Assistant | Admitting: Physician Assistant

## 2022-03-05 DIAGNOSIS — R1011 Right upper quadrant pain: Secondary | ICD-10-CM | POA: Insufficient documentation

## 2022-03-08 DIAGNOSIS — Z4789 Encounter for other orthopedic aftercare: Secondary | ICD-10-CM | POA: Diagnosis not present

## 2022-03-08 DIAGNOSIS — M25511 Pain in right shoulder: Secondary | ICD-10-CM | POA: Diagnosis not present

## 2022-03-14 DIAGNOSIS — Z4789 Encounter for other orthopedic aftercare: Secondary | ICD-10-CM | POA: Diagnosis not present

## 2022-03-14 DIAGNOSIS — M25511 Pain in right shoulder: Secondary | ICD-10-CM | POA: Diagnosis not present

## 2022-03-15 ENCOUNTER — Other Ambulatory Visit: Payer: Self-pay

## 2022-03-15 ENCOUNTER — Encounter: Payer: Self-pay | Admitting: Physician Assistant

## 2022-03-15 ENCOUNTER — Ambulatory Visit: Payer: BC Managed Care – PPO | Admitting: Physician Assistant

## 2022-03-15 DIAGNOSIS — G4710 Hypersomnia, unspecified: Secondary | ICD-10-CM | POA: Diagnosis not present

## 2022-03-15 DIAGNOSIS — E538 Deficiency of other specified B group vitamins: Secondary | ICD-10-CM

## 2022-03-15 DIAGNOSIS — E559 Vitamin D deficiency, unspecified: Secondary | ICD-10-CM

## 2022-03-15 DIAGNOSIS — R5383 Other fatigue: Secondary | ICD-10-CM

## 2022-03-15 DIAGNOSIS — E782 Mixed hyperlipidemia: Secondary | ICD-10-CM | POA: Diagnosis not present

## 2022-03-15 DIAGNOSIS — I10 Essential (primary) hypertension: Secondary | ICD-10-CM

## 2022-03-15 DIAGNOSIS — R946 Abnormal results of thyroid function studies: Secondary | ICD-10-CM

## 2022-03-15 DIAGNOSIS — Z6841 Body Mass Index (BMI) 40.0 and over, adult: Secondary | ICD-10-CM

## 2022-03-15 MED ORDER — AMLODIPINE BESYLATE 2.5 MG PO TABS
2.5000 mg | ORAL_TABLET | Freq: Every day | ORAL | 1 refills | Status: DC
  Filled 2022-03-15: qty 90, 90d supply, fill #0

## 2022-03-15 NOTE — Progress Notes (Signed)
[facility] [place], [place] [address]  Internal MEDICINE  Office Visit Note  Patient Name: Victor Hale  [id number]  [id number]  Date of Service: 03/21/2022  Chief Complaint  Patient presents with   Follow-up    U/s   Diabetes   Hypertension    HPI Pt is here for routine follow up to review abdominal US -eye exam done and is wearing glasses now which helps his headaches some -[place] reviewed which did not show anything acute. Does show increased hepatic echogenicity, favoring stenosis. He states his abdominal pain has since resolved -had an instance the other day where he recognized he stopped breathing in his sleep. He reocgnizes he needs to have study that was previously ordered and will call to schedule this. -BP continues to be borderline elevated and given headaches as well, thinks it may be rising at home as well. Advised to monitor and will start low dose medication -Went to have labs done, but states he had an outstanding bill and wasn't expecting this and unable to do at the time. Will have these done when able  Current Medication: Outpatient Encounter Medications as of 03/15/2022  Medication Sig   amLODipine (NORVASC) 2.5 MG tablet Take 1 tablet (2.5 mg total) by mouth daily.   atorvastatin (LIPITOR) 20 MG tablet TAKE 1 TABLET (20 MG TOTAL) BY MOUTH DAILY.   Blood Glucose Monitoring Suppl (TRUE METRIX METER) DEVI 1 each by Does not apply route daily before breakfast.   Dulaglutide (TRULICITY) 1.5 YH/0.6CB SOPN INJECT 1.5 MG INTO THE SKIN ONCE A WEEK.   gabapentin (NEURONTIN) 300 MG capsule Take 1 capsule by mouth in AM and 2-3 caps by mouth at night before bed   glucose blood (TRUE METRIX BLOOD GLUCOSE TEST) test strip Use daily before breakfast   Insulin Pen Needle 31G X 5 MM MISC 1 each by Does not apply route at bedtime.   meloxicam (MOBIC) 7.5 MG tablet TAKE 1 TABLET (7.5 MG TOTAL) BY MOUTH DAILY.   polyethylene glycol (GOLYTELY) 236 g solution  Evening before colonoscopy at 5 pm fill container with water.  Mix well. Drink 8 oz every 40mn until half contents have been completed.  Continue clear liquid diet. 5 hours prior to colonoscopy resume drinking prep. 8oz every 15 mins until entire contents have been completed.  Do not eat or drink anything 2 hours prior to colonoscopy.   sildenafil (VIAGRA) 50 MG tablet TAKE 1 TABLET (50 MG TOTAL) BY MOUTH DAILY AS NEEDED FOR ERECTILE DYSFUNCTION. AT LEAST 24 HOURS BETWEEN DOSES   TRUEplus Lancets 28G MISC 1 each by Does not apply route 3 (three) times daily before meals.   [DISCONTINUED] dicyclomine (BENTYL) 20 MG tablet Take 1 tablet (20 mg total) by mouth 2 (two) times daily. (Patient not taking: Reported on 07/01/2017)   [DISCONTINUED] famotidine (PEPCID) 20 MG tablet Take 1 tablet (20 mg total) by mouth 2 (two) times daily. (Patient not taking: Reported on 07/01/2017)   [DISCONTINUED] omeprazole (PRILOSEC) 20 MG capsule Take 1 capsule (20 mg total) by mouth daily. (Patient not taking: Reported on 07/01/2017)   No facility-administered encounter medications on file as of 03/15/2022.    Surgical History: Past Surgical History:  Procedure Laterality Date   COLONOSCOPY WITH PROPOFOL N/A 02/28/2022   Procedure: COLONOSCOPY WITH PROPOFOL;  Surgeon: AJonathon Bellows MD;  [facility];  Service: Gastroenterology;  Laterality: N/A;   ROTATOR CUFF REPAIR Right 12/07/2021    Medical History: Past Medical History:  Diagnosis Date   Diabetes mellitus without complication ([place])    Fatty liver    Hypertension     Family History: Family History  Problem Relation Age of Onset   Hypertension Mother    Diabetes Mother     Social History   Socioeconomic History   Marital status: Married    Spouse name: Not on file   Number of children: Not on file   Years of education: Not on file   Highest education level: Not on file  Occupational History   Not on file  Tobacco Use   Smoking  status: Never   Smokeless tobacco: Never  Vaping Use   Vaping Use: Never used  Substance and Sexual Activity   Alcohol use: No   Drug use: Yes    Frequency: 6.0 times per week    Types: Marijuana   Sexual activity: Yes  Other Topics Concern   Not on file  Social History Narrative   Not on file   Social Determinants of Health   Financial Resource Strain: Not on file  Food Insecurity: Not on file  Transportation Needs: Not on file  Physical Activity: Not on file  Stress: Not on file  Social Connections: Not on file  Intimate Partner Violence: Not on file      Review of Systems  Constitutional:  Positive for fatigue. Negative for chills and unexpected weight change.  HENT:  Negative for congestion, postnasal drip, rhinorrhea, sneezing and sore throat.   Eyes:  Negative for redness.  Respiratory:  Negative for cough, chest tightness and shortness of breath.   Cardiovascular:  Negative for chest pain and palpitations.  Gastrointestinal:  Negative for abdominal pain, constipation, diarrhea, nausea and vomiting.  Genitourinary:  Negative for dysuria and frequency.  Musculoskeletal:  Positive for arthralgias. Negative for back pain, joint swelling and neck pain.  Skin:  Negative for rash.  Neurological: Negative.  Negative for tremors and numbness.  Hematological:  Negative for adenopathy. Does not bruise/bleed easily.  Psychiatric/Behavioral:  Positive for sleep disturbance. Negative for behavioral problems (Depression) and suicidal ideas. The patient is not nervous/anxious.     Vital Signs: BP (!) 126/90   Pulse 94   Temp 98 F (36.7 C)   Resp 16   Ht '6\' 1"'$  (1.854 m)   Wt (!) 340 lb (154.2 kg)   SpO2 96%   BMI 44.86 kg/m    Physical Exam Vitals and nursing note reviewed.  Constitutional:      General: He is not in acute distress.    Appearance: He is well-developed. He is obese. He is not diaphoretic.  HENT:     Head: Normocephalic and atraumatic.      Mouth/Throat:     Pharynx: No oropharyngeal exudate.  Eyes:     Pupils: Pupils are equal, round, and reactive to light.  Neck:     Thyroid: No thyromegaly.     Vascular: No JVD.     Trachea: No tracheal deviation.  Cardiovascular:     Rate and Rhythm: Normal rate and regular rhythm.     Heart sounds: Normal heart sounds. No murmur heard.    No friction rub. No gallop.  Pulmonary:     Effort: Pulmonary effort is normal. No respiratory distress.     Breath sounds: No wheezing or rales.  Chest:     Chest wall: No tenderness.  Abdominal:     General: Bowel sounds are normal.     Palpations: Abdomen is soft.  Musculoskeletal:        General: Normal range of motion.     Cervical back: Normal range of motion and neck supple.  Lymphadenopathy:     Cervical: No cervical adenopathy.  Skin:    General: Skin is warm and dry.  Neurological:     Mental Status: He is alert and oriented to person, place, and time.     Cranial Nerves: No cranial nerve deficit.  Psychiatric:        Behavior: Behavior normal.        Thought Content: Thought content normal.        Judgment: Judgment normal.        Assessment/Plan: 1. Essential (primary) hypertension Will start on low dose norvasc and titrate as needed, Should monitor at home - amLODipine (NORVASC) 2.5 MG tablet; Take 1 tablet (2.5 mg total) by mouth daily.  Dispense: 90 tablet; Refill: 1  2. Hypersomnia Will move forward with scheduling sleep study as previously ordered  3. Other fatigue - CBC w/Diff/Platelet - Comprehensive metabolic panel - Lipid Panel With LDL/HDL Ratio - TSH + free T4  4. Mixed hyperlipidemia - Lipid Panel With LDL/HDL Ratio  5. Abnormal thyroid exam - TSH + free T4  6. Vitamin D deficiency - VITAMIN D 25 Hydroxy (Vit-D Deficiency, Fractures)  7. B12 deficiency - B12 and Folate Panel  8. Morbid obesity with BMI of 40.0-44.9, adult ([place]) Continue to work on diet and exercise   General Counseling:  owenn rothermel understanding of the findings of todays visit and agrees with plan of treatment. I have discussed any further diagnostic evaluation that may be needed or ordered today. We also reviewed his medications today. he has been encouraged to call the office with any questions or concerns that should arise related to todays visit.    Orders Placed This Encounter  Procedures   CBC w/Diff/Platelet   Comprehensive metabolic panel   Lipid Panel With LDL/HDL Ratio   TSH + free T4   B12 and Folate Panel   VITAMIN D 25 Hydroxy (Vit-D Deficiency, Fractures)    Meds ordered this encounter  Medications   amLODipine (NORVASC) 2.5 MG tablet    Sig: Take 1 tablet (2.5 mg total) by mouth daily.    Dispense:  90 tablet    Refill:  1    This patient was seen by Drema Dallas, PA-C in collaboration with Dr. Clayborn Bigness as a part of collaborative care agreement.   Total time spent:30 Minutes Time spent includes review of chart, medications, test results, and follow up plan with the patient.      Dr Lavera Guise Internal medicine

## 2022-03-22 ENCOUNTER — Other Ambulatory Visit: Payer: Self-pay

## 2022-03-23 DIAGNOSIS — M25511 Pain in right shoulder: Secondary | ICD-10-CM | POA: Diagnosis not present

## 2022-03-23 DIAGNOSIS — Z4789 Encounter for other orthopedic aftercare: Secondary | ICD-10-CM | POA: Diagnosis not present

## 2022-04-16 ENCOUNTER — Encounter: Payer: Self-pay | Admitting: Physician Assistant

## 2022-04-16 ENCOUNTER — Ambulatory Visit (INDEPENDENT_AMBULATORY_CARE_PROVIDER_SITE_OTHER): Payer: BC Managed Care – PPO | Admitting: Physician Assistant

## 2022-04-16 ENCOUNTER — Other Ambulatory Visit: Payer: Self-pay

## 2022-04-16 DIAGNOSIS — I10 Essential (primary) hypertension: Secondary | ICD-10-CM

## 2022-04-16 DIAGNOSIS — E1140 Type 2 diabetes mellitus with diabetic neuropathy, unspecified: Secondary | ICD-10-CM

## 2022-04-16 DIAGNOSIS — G4710 Hypersomnia, unspecified: Secondary | ICD-10-CM

## 2022-04-16 MED ORDER — TRULICITY 1.5 MG/0.5ML ~~LOC~~ SOAJ
1.5000 mg | SUBCUTANEOUS | 3 refills | Status: DC
  Filled 2022-04-16: qty 2, 28d supply, fill #0

## 2022-04-16 NOTE — Progress Notes (Signed)
[facility] [place], [place] [address]  Internal MEDICINE  Office Visit Note  Patient Name: Victor Hale  [id number]  [id number]  Date of Service: 04/20/2022  Chief Complaint  Patient presents with   Follow-up   Hypertension   Diabetes   Medication Refill    Trulicity    HPI Pt is here for routine follow up -Bp normally 140s/90, no headaches. Recheck in office 142/90, hasnt taken BP med today and discussed the importance of this -Did have eye exam, needs glasses and is looking to get new ones -Did call about sleep study and thinks it has been approved and will call to schedule--again discussed importance of this and its relation to multiple other health concerns -Does mention some problems with ED recently as well and again discussed need to sleep study -Right shoulder still problematic and unable to work out other than walking. -Looking at starting in the gym  Current Medication: Outpatient Encounter Medications as of 04/16/2022  Medication Sig   amLODipine (NORVASC) 2.5 MG tablet Take 1 tablet (2.5 mg total) by mouth daily.   atorvastatin (LIPITOR) 20 MG tablet TAKE 1 TABLET (20 MG TOTAL) BY MOUTH DAILY.   Blood Glucose Monitoring Suppl (TRUE METRIX METER) DEVI 1 each by Does not apply route daily before breakfast.   gabapentin (NEURONTIN) 300 MG capsule Take 1 capsule by mouth in AM and 2-3 caps by mouth at night before bed   glucose blood (TRUE METRIX BLOOD GLUCOSE TEST) test strip Use daily before breakfast   Insulin Pen Needle 31G X 5 MM MISC 1 each by Does not apply route at bedtime.   meloxicam (MOBIC) 7.5 MG tablet TAKE 1 TABLET (7.5 MG TOTAL) BY MOUTH DAILY.   polyethylene glycol (GOLYTELY) 236 g solution Evening before colonoscopy at 5 pm fill container with water.  Mix well. Drink 8 oz every 6mn until half contents have been completed.  Continue clear liquid diet. 5 hours prior to colonoscopy resume drinking prep. 8oz every 15 mins until  entire contents have been completed.  Do not eat or drink anything 2 hours prior to colonoscopy.   sildenafil (VIAGRA) 50 MG tablet TAKE 1 TABLET (50 MG TOTAL) BY MOUTH DAILY AS NEEDED FOR ERECTILE DYSFUNCTION. AT LEAST 24 HOURS BETWEEN DOSES   TRUEplus Lancets 28G MISC 1 each by Does not apply route 3 (three) times daily before meals.   [DISCONTINUED] Dulaglutide (TRULICITY) 1.5 MWP/8.0DXSOPN INJECT 1.5 MG INTO THE SKIN ONCE A WEEK.   Dulaglutide (TRULICITY) 1.5 MIP/3.8SNSOPN INJECT 1.5 MG INTO THE SKIN ONCE A WEEK.   [DISCONTINUED] dicyclomine (BENTYL) 20 MG tablet Take 1 tablet (20 mg total) by mouth 2 (two) times daily. (Patient not taking: Reported on 07/01/2017)   [DISCONTINUED] famotidine (PEPCID) 20 MG tablet Take 1 tablet (20 mg total) by mouth 2 (two) times daily. (Patient not taking: Reported on 07/01/2017)   [DISCONTINUED] omeprazole (PRILOSEC) 20 MG capsule Take 1 capsule (20 mg total) by mouth daily. (Patient not taking: Reported on 07/01/2017)   No facility-administered encounter medications on file as of 04/16/2022.    Surgical History: Past Surgical History:  Procedure Laterality Date   COLONOSCOPY WITH PROPOFOL N/A 02/28/2022   Procedure: COLONOSCOPY WITH PROPOFOL;  Surgeon: AJonathon Bellows MD;  [facility]  Service: Gastroenterology;  Laterality: N/A;   ROTATOR CUFF REPAIR Right 12/07/2021    Medical History: Past Medical History:  Diagnosis Date   [place]    Fatty  liver    Hypertension     Family History: Family History  Problem Relation Age of Onset   Hypertension Mother    Diabetes Mother     Social History   Socioeconomic History   Marital status: Married    Spouse name: Not on file   Number of children: Not on file   Years of education: Not on file   Highest education level: Not on file  Occupational History   Not on file  Tobacco Use   Smoking status: Never   Smokeless tobacco: Never  Vaping Use    Vaping Use: Never used  Substance and Sexual Activity   Alcohol use: No   Drug use: Yes    Frequency: 6.0 times per week    Types: Marijuana   Sexual activity: Yes  Other Topics Concern   Not on file  Social History Narrative   Not on file   Social Determinants of Health   Financial Resource Strain: Not on file  Food Insecurity: Not on file  Transportation Needs: Not on file  Physical Activity: Not on file  Stress: Not on file  Social Connections: Not on file  Intimate Partner Violence: Not on file      Review of Systems  Constitutional:  Positive for fatigue. Negative for chills and unexpected weight change.  HENT:  Negative for congestion, postnasal drip, rhinorrhea, sneezing and sore throat.   Eyes:  Negative for redness.  Respiratory:  Negative for cough, chest tightness and shortness of breath.   Cardiovascular:  Negative for chest pain and palpitations.  Gastrointestinal:  Negative for abdominal pain, constipation, diarrhea, nausea and vomiting.  Genitourinary:  Negative for dysuria and frequency.  Musculoskeletal:  Positive for arthralgias. Negative for back pain, joint swelling and neck pain.  Skin:  Negative for rash.  Neurological: Negative.  Negative for tremors and numbness.  Hematological:  Negative for adenopathy. Does not bruise/bleed easily.  Psychiatric/Behavioral:  Positive for sleep disturbance. Negative for behavioral problems (Depression) and suicidal ideas. The patient is not nervous/anxious.     Vital Signs: BP (!) 142/90 Comment: 165/96  Pulse 72   Temp 97.8 F (36.6 C)   Resp 16   Ht '6\' 1"'$  (1.854 m)   Wt (!) 355 lb (161 kg)   SpO2 96%   BMI 46.84 kg/m    Physical Exam Vitals and nursing note reviewed.  Constitutional:      General: He is not in acute distress.    Appearance: He is well-developed. He is obese. He is not diaphoretic.  HENT:     Head: Normocephalic and atraumatic.     Mouth/Throat:     Pharynx: No oropharyngeal  exudate.  Eyes:     Pupils: Pupils are equal, round, and reactive to light.  Neck:     Thyroid: No thyromegaly.     Vascular: No JVD.     Trachea: No tracheal deviation.  Cardiovascular:     Rate and Rhythm: Normal rate and regular rhythm.     Heart sounds: Normal heart sounds. No murmur heard.    No friction rub. No gallop.  Pulmonary:     Effort: Pulmonary effort is normal. No respiratory distress.     Breath sounds: No wheezing or rales.  Chest:     Chest wall: No tenderness.  Abdominal:     General: Bowel sounds are normal.     Palpations: Abdomen is soft.  Musculoskeletal:        General: Normal range of  motion.     Cervical back: Normal range of motion and neck supple.  Lymphadenopathy:     Cervical: No cervical adenopathy.  Skin:    General: Skin is warm and dry.  Neurological:     Mental Status: He is alert and oriented to person, place, and time.     Cranial Nerves: No cranial nerve deficit.  Psychiatric:        Behavior: Behavior normal.        Thought Content: Thought content normal.        Judgment: Judgment normal.        Assessment/Plan: 1. Type 2 diabetes mellitus with diabetic neuropathy, without long-term current use of insulin ([place]) Too soon to recheck A1c, Will continue trulicity as before. Continue to work on diet and exercise - Dulaglutide (TRULICITY) 1.5 MG/5.0IB SOPN; INJECT 1.5 MG INTO THE SKIN ONCE A WEEK.  Dispense: 2 mL; Refill: 3  2. Essential (primary) hypertension Elevated in office due to not taking his medication and discussed importance of med adherence  3. Hypersomnia Will move forward with scheduling PSG   General Counseling: garlon tuggle understanding of the findings of todays visit and agrees with plan of treatment. I have discussed any further diagnostic evaluation that may be needed or ordered today. We also reviewed his medications today. he has been encouraged to call the office with any questions or concerns that should  arise related to todays visit.    No orders of the defined types were placed in this encounter.   Meds ordered this encounter  Medications   Dulaglutide (TRULICITY) 1.5 BC/4.8GQ SOPN    Sig: INJECT 1.5 MG INTO THE SKIN ONCE A WEEK.    Dispense:  2 mL    Refill:  3    This patient was seen by Drema Dallas, PA-C in collaboration with Dr. Clayborn Bigness as a part of collaborative care agreement.   Total time spent:30 Minutes Time spent includes review of chart, medications, test results, and follow up plan with the patient.      Dr Lavera Guise Internal medicine

## 2022-04-17 ENCOUNTER — Other Ambulatory Visit: Payer: Self-pay

## 2022-04-18 DIAGNOSIS — M25511 Pain in right shoulder: Secondary | ICD-10-CM | POA: Diagnosis not present

## 2022-04-18 DIAGNOSIS — Z4789 Encounter for other orthopedic aftercare: Secondary | ICD-10-CM | POA: Diagnosis not present

## 2022-05-11 DIAGNOSIS — M19011 Primary osteoarthritis, right shoulder: Secondary | ICD-10-CM | POA: Diagnosis not present

## 2022-05-11 DIAGNOSIS — W07XXXD Fall from chair, subsequent encounter: Secondary | ICD-10-CM | POA: Diagnosis not present

## 2022-05-11 DIAGNOSIS — S46011D Strain of muscle(s) and tendon(s) of the rotator cuff of right shoulder, subsequent encounter: Secondary | ICD-10-CM | POA: Diagnosis not present

## 2022-05-17 DIAGNOSIS — M25511 Pain in right shoulder: Secondary | ICD-10-CM | POA: Diagnosis not present

## 2022-05-17 DIAGNOSIS — Z4789 Encounter for other orthopedic aftercare: Secondary | ICD-10-CM | POA: Diagnosis not present

## 2022-05-25 DIAGNOSIS — W1839XD Other fall on same level, subsequent encounter: Secondary | ICD-10-CM | POA: Diagnosis not present

## 2022-05-25 DIAGNOSIS — S46011D Strain of muscle(s) and tendon(s) of the rotator cuff of right shoulder, subsequent encounter: Secondary | ICD-10-CM | POA: Diagnosis not present

## 2022-06-14 DIAGNOSIS — X58XXXD Exposure to other specified factors, subsequent encounter: Secondary | ICD-10-CM | POA: Diagnosis not present

## 2022-06-14 DIAGNOSIS — S46011D Strain of muscle(s) and tendon(s) of the rotator cuff of right shoulder, subsequent encounter: Secondary | ICD-10-CM | POA: Diagnosis not present

## 2022-06-14 DIAGNOSIS — M75121 Complete rotator cuff tear or rupture of right shoulder, not specified as traumatic: Secondary | ICD-10-CM | POA: Diagnosis not present

## 2022-06-18 ENCOUNTER — Encounter: Payer: Self-pay | Admitting: Physician Assistant

## 2022-06-18 ENCOUNTER — Other Ambulatory Visit: Payer: Self-pay

## 2022-06-18 ENCOUNTER — Ambulatory Visit: Payer: BC Managed Care – PPO | Admitting: Physician Assistant

## 2022-06-18 VITALS — BP 110/57 | HR 94 | Temp 98.0°F | Resp 16 | Ht 73.0 in | Wt 365.4 lb

## 2022-06-18 DIAGNOSIS — E1140 Type 2 diabetes mellitus with diabetic neuropathy, unspecified: Secondary | ICD-10-CM

## 2022-06-18 DIAGNOSIS — F411 Generalized anxiety disorder: Secondary | ICD-10-CM | POA: Diagnosis not present

## 2022-06-18 DIAGNOSIS — G629 Polyneuropathy, unspecified: Secondary | ICD-10-CM | POA: Diagnosis not present

## 2022-06-18 DIAGNOSIS — I10 Essential (primary) hypertension: Secondary | ICD-10-CM | POA: Diagnosis not present

## 2022-06-18 DIAGNOSIS — Z6841 Body Mass Index (BMI) 40.0 and over, adult: Secondary | ICD-10-CM

## 2022-06-18 LAB — POCT GLYCOSYLATED HEMOGLOBIN (HGB A1C): Hemoglobin A1C: 7.1 % — AB (ref 4.0–5.6)

## 2022-06-18 MED ORDER — AMLODIPINE BESYLATE 2.5 MG PO TABS
2.5000 mg | ORAL_TABLET | Freq: Every day | ORAL | 1 refills | Status: AC
  Filled 2022-06-18: qty 90, 90d supply, fill #0

## 2022-06-18 MED ORDER — BUPROPION HCL ER (XL) 150 MG PO TB24
150.0000 mg | ORAL_TABLET | Freq: Every day | ORAL | 1 refills | Status: DC
  Filled 2022-06-18: qty 90, 90d supply, fill #0

## 2022-06-18 MED ORDER — TRULICITY 3 MG/0.5ML ~~LOC~~ SOAJ
3.0000 mg | SUBCUTANEOUS | 2 refills | Status: DC
  Filled 2022-06-18: qty 2, 28d supply, fill #0
  Filled 2022-08-09: qty 2, 28d supply, fill #1

## 2022-06-18 MED ORDER — GABAPENTIN 300 MG PO CAPS
ORAL_CAPSULE | ORAL | 1 refills | Status: DC
  Filled 2022-06-18: qty 120, 30d supply, fill #0
  Filled 2022-08-09: qty 120, 30d supply, fill #1

## 2022-06-18 NOTE — Progress Notes (Signed)
[facility] [place], [place] [address]  Internal MEDICINE  Office Visit Note  Patient Name: Victor Hale  [id number]  [id number]  Date of Service: 06/26/2022  Chief Complaint  Patient presents with   Follow-up   Diabetes   Hypertension   Medication Refill    HPI Pt is here for routine follow up -Had an MRI for shoulder and found he has to have surgery again -Has not been able to exercise much because of this. Will see about walking 17mns per day -HST scheduled for tomorrow -Feeling anxious and will get chest tightness when this occurs. Relates change in activity level from shoulder causing him stress and anxiety. He also has been having trouble with ED and discussed this can be due to mental state but also sleep apnea as well. -Will try starting wellbutrin to help with mood and weight loss -Worsening diet along with decline in exercise which explain rising A1c. Also notes increased thirst and urination -Still needs to get blood work done  Current Medication: Outpatient Encounter Medications as of 06/18/2022  Medication Sig   atorvastatin (LIPITOR) 20 MG tablet TAKE 1 TABLET (20 MG TOTAL) BY MOUTH DAILY.   Blood Glucose Monitoring Suppl (TRUE METRIX METER) DEVI 1 each by Does not apply route daily before breakfast.   buPROPion (WELLBUTRIN XL) 150 MG 24 hr tablet Take 1 tablet (150 mg total) by mouth daily.   Dulaglutide (TRULICITY) 3 MXF/8.1WESOPN Inject 3 mg as directed once a week.   glucose blood (TRUE METRIX BLOOD GLUCOSE TEST) test strip Use daily before breakfast   Insulin Pen Needle 31G X 5 MM MISC 1 each by Does not apply route at bedtime.   meloxicam (MOBIC) 7.5 MG tablet TAKE 1 TABLET (7.5 MG TOTAL) BY MOUTH DAILY.   polyethylene glycol (GOLYTELY) 236 g solution Evening before colonoscopy at 5 pm fill container with water.  Mix well. Drink 8 oz every 133m until half contents have been completed.  Continue clear liquid diet. 5 hours prior to  colonoscopy resume drinking prep. 8oz every 15 mins until entire contents have been completed.  Do not eat or drink anything 2 hours prior to colonoscopy.   sildenafil (VIAGRA) 50 MG tablet TAKE 1 TABLET (50 MG TOTAL) BY MOUTH DAILY AS NEEDED FOR ERECTILE DYSFUNCTION. AT LEAST 24 HOURS BETWEEN DOSES   TRUEplus Lancets 28G MISC 1 each by Does not apply route 3 (three) times daily before meals.   [DISCONTINUED] amLODipine (NORVASC) 2.5 MG tablet Take 1 tablet (2.5 mg total) by mouth daily.   [DISCONTINUED] Dulaglutide (TRULICITY) 1.5 MGXH/3.7JIOPN INJECT 1.5 MG INTO THE SKIN ONCE A WEEK.   [DISCONTINUED] gabapentin (NEURONTIN) 300 MG capsule Take 1 capsule by mouth in AM and 2-3 caps by mouth at night before bed   amLODipine (NORVASC) 2.5 MG tablet Take 1 tablet (2.5 mg total) by mouth daily.   gabapentin (NEURONTIN) 300 MG capsule Take 1 capsule (300 mg total) by mouth in the morning AND 2-3 capsules (600-900 mg total) at bedtime.   [DISCONTINUED] dicyclomine (BENTYL) 20 MG tablet Take 1 tablet (20 mg total) by mouth 2 (two) times daily. (Patient not taking: Reported on 07/01/2017)   [DISCONTINUED] famotidine (PEPCID) 20 MG tablet Take 1 tablet (20 mg total) by mouth 2 (two) times daily. (Patient not taking: Reported on 07/01/2017)   [DISCONTINUED] omeprazole (PRILOSEC) 20 MG capsule Take 1 capsule (20 mg total) by mouth daily. (Patient not taking: Reported on 07/01/2017)   No  facility-administered encounter medications on file as of 06/18/2022.    Surgical History: Past Surgical History:  Procedure Laterality Date   COLONOSCOPY WITH PROPOFOL N/A 02/28/2022   Procedure: COLONOSCOPY WITH PROPOFOL;  Surgeon: Jonathon Bellows, MD;  Location: [facility] ENDOSCOPY;  Service: Gastroenterology;  Laterality: N/A;   ROTATOR CUFF REPAIR Right 12/07/2021    Medical History: Past Medical History:  Diagnosis Date   Diabetes mellitus without complication ([place])    Fatty liver    Hypertension     Family  History: Family History  Problem Relation Age of Onset   Hypertension Mother    Diabetes Mother     Social History   Socioeconomic History   Marital status: Married    Spouse name: Not on file   Number of children: Not on file   Years of education: Not on file   Highest education level: Not on file  Occupational History   Not on file  Tobacco Use   Smoking status: Never   Smokeless tobacco: Never  Vaping Use   Vaping Use: Never used  Substance and Sexual Activity   Alcohol use: No   Drug use: Yes    Frequency: 6.0 times per week    Types: Marijuana   Sexual activity: Yes  Other Topics Concern   Not on file  Social History Narrative   Not on file   Social Determinants of Health   Financial Resource Strain: Not on file  Food Insecurity: Not on file  Transportation Needs: Not on file  Physical Activity: Not on file  Stress: Not on file  Social Connections: Not on file  Intimate Partner Violence: Not on file      Review of Systems  Constitutional:  Positive for fatigue. Negative for chills and unexpected weight change.  HENT:  Negative for congestion, postnasal drip, rhinorrhea, sneezing and sore throat.   Eyes:  Negative for redness.  Respiratory:  Negative for cough, chest tightness and shortness of breath.   Cardiovascular:  Negative for chest pain and palpitations.  Gastrointestinal:  Negative for abdominal pain, constipation, diarrhea, nausea and vomiting.  Genitourinary:  Negative for dysuria and frequency.  Musculoskeletal:  Positive for arthralgias. Negative for back pain, joint swelling and neck pain.  Skin:  Negative for rash.  Neurological: Negative.  Negative for tremors and numbness.  Hematological:  Negative for adenopathy. Does not bruise/bleed easily.  Psychiatric/Behavioral:  Positive for dysphoric mood and sleep disturbance. Negative for behavioral problems (Depression) and suicidal ideas. The patient is nervous/anxious.     Vital Signs: BP  (!) 110/57   Pulse 94   Temp 98 F (36.7 C)   Resp 16   Ht '6\' 1"'$  (1.854 m)   Wt (!) 365 lb 6.4 oz (165.7 kg)   SpO2 98%   BMI 48.21 kg/m    Physical Exam Vitals and nursing note reviewed.  Constitutional:      General: He is not in acute distress.    Appearance: He is well-developed. He is obese. He is not diaphoretic.  HENT:     Head: Normocephalic and atraumatic.     Mouth/Throat:     Pharynx: No oropharyngeal exudate.  Eyes:     Pupils: Pupils are equal, round, and reactive to light.  Neck:     Thyroid: No thyromegaly.     Vascular: No JVD.     Trachea: No tracheal deviation.  Cardiovascular:     Rate and Rhythm: Normal rate and regular rhythm.     Heart sounds:  Normal heart sounds. No murmur heard.    No friction rub. No gallop.  Pulmonary:     Effort: Pulmonary effort is normal. No respiratory distress.     Breath sounds: No wheezing or rales.  Chest:     Chest wall: No tenderness.  Abdominal:     General: Bowel sounds are normal.     Palpations: Abdomen is soft.  Musculoskeletal:        General: Normal range of motion.     Cervical back: Normal range of motion and neck supple.  Lymphadenopathy:     Cervical: No cervical adenopathy.  Skin:    General: Skin is warm and dry.  Neurological:     Mental Status: He is alert and oriented to person, place, and time.     Cranial Nerves: No cranial nerve deficit.  Psychiatric:        Behavior: Behavior normal.        Thought Content: Thought content normal.        Judgment: Judgment normal.        Assessment/Plan: 1. Type 2 diabetes mellitus with diabetic neuropathy, without long-term current use of insulin (HCC) - POCT HgB A1C is 7.1 which is significantly increased from 6.1 last check. Will increase trulicity and will work on diet and exercise as able - Urine Microalbumin w/creat. ratio - Dulaglutide (TRULICITY) 3 UM/3.5TI SOPN; Inject 3 mg as directed once a week.  Dispense: 2 mL; Refill: 2  2. Essential  (primary) hypertension Continue current medication - amLODipine (NORVASC) 2.5 MG tablet; Take 1 tablet (2.5 mg total) by mouth daily.  Dispense: 90 tablet; Refill: 1  3. Neuropathy - gabapentin (NEURONTIN) 300 MG capsule; Take 1 capsule (300 mg total) by mouth in the morning AND 2-3 capsules (600-900 mg total) at bedtime.  Dispense: 120 capsule; Refill: 1  4. GAD (generalized anxiety disorder) Will start wellbutrin to help anxiety and dysphoric mood while also helping weight loss - buPROPion (WELLBUTRIN XL) 150 MG 24 hr tablet; Take 1 tablet (150 mg total) by mouth daily.  Dispense: 90 tablet; Refill: 1  5. Morbid obesity with BMI of 45.0-49.9, adult ([place]) Will work on diet and exercise and will increase trulicity and start on wellbutrin   General Counseling: Victor Hale verbalizes understanding of the findings of todays visit and agrees with plan of treatment. I have discussed any further diagnostic evaluation that may be needed or ordered today. We also reviewed his medications today. he has been encouraged to call the office with any questions or concerns that should arise related to todays visit.    Orders Placed This Encounter  Procedures   Urine Microalbumin w/creat. ratio   POCT HgB A1C    Meds ordered this encounter  Medications   Dulaglutide (TRULICITY) 3 RW/4.3XV SOPN    Sig: Inject 3 mg as directed once a week.    Dispense:  2 mL    Refill:  2   amLODipine (NORVASC) 2.5 MG tablet    Sig: Take 1 tablet (2.5 mg total) by mouth daily.    Dispense:  90 tablet    Refill:  1   gabapentin (NEURONTIN) 300 MG capsule    Sig: Take 1 capsule (300 mg total) by mouth in the morning AND 2-3 capsules (600-900 mg total) at bedtime.    Dispense:  120 capsule    Refill:  1    Dose increase   buPROPion (WELLBUTRIN XL) 150 MG 24 hr tablet    Sig: Take 1  tablet (150 mg total) by mouth daily.    Dispense:  90 tablet    Refill:  1    This patient was seen by Drema Dallas, PA-C in  collaboration with Dr. Clayborn Bigness as a part of collaborative care agreement.   Total time spent:30 Minutes Time spent includes review of chart, medications, test results, and follow up plan with the patient.      Dr Lavera Guise Internal medicine

## 2022-06-19 ENCOUNTER — Encounter (INDEPENDENT_AMBULATORY_CARE_PROVIDER_SITE_OTHER): Payer: BC Managed Care – PPO | Admitting: Internal Medicine

## 2022-06-19 DIAGNOSIS — G4719 Other hypersomnia: Secondary | ICD-10-CM | POA: Diagnosis not present

## 2022-06-19 LAB — MICROALBUMIN / CREATININE URINE RATIO
Creatinine, Urine: 233.8 mg/dL
Microalb/Creat Ratio: 60 mg/g creat — ABNORMAL HIGH (ref 0–29)
Microalbumin, Urine: 140.7 ug/mL

## 2022-06-20 DIAGNOSIS — M24811 Other specific joint derangements of right shoulder, not elsewhere classified: Secondary | ICD-10-CM | POA: Diagnosis not present

## 2022-06-20 DIAGNOSIS — W07XXXD Fall from chair, subsequent encounter: Secondary | ICD-10-CM | POA: Diagnosis not present

## 2022-06-20 DIAGNOSIS — S46011D Strain of muscle(s) and tendon(s) of the rotator cuff of right shoulder, subsequent encounter: Secondary | ICD-10-CM | POA: Diagnosis not present

## 2022-06-26 NOTE — Procedures (Signed)
[place]  Portable Polysomnogram Report Part 1 Phone: [phone] Fax: [phone]  Patient Name: Victor Hale, Victor Hale Recording Device: Glee Arvin  D.O.B.: Aug 30, 1975 Acquisition Number: [id number]  Referring Physician: Drema Dallas. PA-C Acquisition Date: 06/19/2022   History: The patient is a 46 years old male who was referred for evaluation of possible sleep apnea.  Medical History: diabetes, hypertension.  Medications: Trulicity, meloxicam, sildenafil, Flomax.  PROCEDURE  The unattended portable polysomnogram was conducted on the night of 06/19/2022.  The following parameters were monitored: Nasal and oral airflow, and body position. Additionally, thoracic and abdominal movements were recorded by inductance plethysmography. Oxygen saturation (SpO2) and heart rate (ECG) was monitored using a pulse Oximeter.  The tracing was scored using 30 second epochs. Hypopneas were scored per AASM definition VIIID1.B (4% desaturation).    Description: The total recording time was 493.8 minutes. Sleep parameters are not recorded.  Respiratory monitoring demonstrated significant snoring across the night. The entire recording was in the supine position. There were a total of 753 apneas and hypopneas for a Respiratory Event Index of 91.5 apneas and hypopneas per hour of recording. The average duration of the respiratory events was 26.5 seconds with a maximum duration of 77.0 seconds. The respiratory events were associated with peripheral oxygen desaturations on the average to 87 %. The lowest oxygen desaturation associated with a respiratory event was 66 %. Additionally, the mean oxygen saturation was 87 %. The total duration of oxygen < 90% was 284.6 minutes and <80% was 80.4 minutes.   Cardiac monitoring- The average heart rate during the recording was 73.7 bpm.  Impression: This routine overnight portable polysomnogram demonstrated very severe obstructive sleep  apnea. Overall the Respiratory Event Index was 91.5 apneas and hypopneas per hour of recording with the lowest desaturation to 66 %. All sleep was in the supine position.  Recommendations:     An emergent CPAP titration would be recommended due to the severity of the sleep apnea. Would recommend weight loss in a patient with a BMI of 43.4 lb/in2.  Allyne Gee, MD [facility] Diplomate ABMS Pulmonary Critical Care and Sleep Medicine Electronically reviewed and digitally signed   [place]  Portable Polysomnogram Report Part 2 Phone: [phone] Fax: [phone]   Study Date: 06/19/2022  Patient Name: Victor Hale, Victor Hale Recording Device: Glee Arvin  Sex: M Height: 73.0 in.  D.O.B.: 1976-07-03 Weight: 329.0 lbs.  Age: 81 years B.M.I: 43.4 lb/in2   Times and Durations  Lights off clock time:  8:16:33 PM Total Recording Time (TRT): 493.8 minutes  Lights on clock time: 4:30:21 AM Time In Bed (TIB): 493.8 minutes   Summary  AHI 91.5 OAI 91.5 CAI 0.0 Lowest Desat 67  AHI is the number of apneas and hypopneas per hour. OAI is the number of obstructive apneas per hour. CAI is the number of central apneas per hour. Lowest Desat is the lowest blood oxygen level that lasted at least 2 seconds.  RESPIRATORY EVENTS   Index (#/hour) Total # of Events Mean duration  (sec) Max duration  (sec) # of Events by Position       Supine Prone Left Right Up  Central Apneas 0.0 0 0.0 0.0 0              Obstructive Apneas 91.5 753 26.5 77.0 753              Mixed Apneas 0.0 0 0.0 0.0 0  Hypopneas 0.0 0 0.0 0.0 0              Apneas + Hypopneas 91.5 753 26.5 77.0 753              Total 91.5 753 26.5 77.0 753              Time in Position 493.8              AHI in Position 91.5                Oximetry Summary   Dur. (min) % TIB  <90 % 284.6 57.6  <85 % 178.9 36.2  <80 % 80.4 16.3  <70 % 1.2 0.2  Total Dur (min) < 89 262.5 min  Average (%) 87  Total # of Desats 776  Desat  Index (#/hour) 98.0  Desat Max (%) 30  Desat Max dur (sec) 50.0  Lowest SpO2 % during sleep 66  Duration of Min SpO2 (sec) 1    Heart Rate Stats  Mean HR during sleep (BPM)  Highest HR during sleep 255  (BPM)  Highest HR during TIB  255 (BPM)    Snoring Summary  Total Snoring Episodes 695  Total Duration with Snoring 67.0 minutes  Mean Duration of Snoring 5.8 seconds  Percentage of Snoring 13.6 %

## 2022-07-02 DIAGNOSIS — G4739 Other sleep apnea: Secondary | ICD-10-CM | POA: Diagnosis not present

## 2022-07-02 DIAGNOSIS — Z01818 Encounter for other preprocedural examination: Secondary | ICD-10-CM | POA: Diagnosis not present

## 2022-07-02 DIAGNOSIS — E119 Type 2 diabetes mellitus without complications: Secondary | ICD-10-CM | POA: Diagnosis not present

## 2022-07-02 DIAGNOSIS — E7849 Other hyperlipidemia: Secondary | ICD-10-CM | POA: Diagnosis not present

## 2022-07-12 DIAGNOSIS — Z6841 Body Mass Index (BMI) 40.0 and over, adult: Secondary | ICD-10-CM | POA: Diagnosis not present

## 2022-07-12 DIAGNOSIS — G8929 Other chronic pain: Secondary | ICD-10-CM | POA: Diagnosis not present

## 2022-07-12 DIAGNOSIS — Z87891 Personal history of nicotine dependence: Secondary | ICD-10-CM | POA: Diagnosis not present

## 2022-07-12 DIAGNOSIS — Z7985 Long-term (current) use of injectable non-insulin antidiabetic drugs: Secondary | ICD-10-CM | POA: Diagnosis not present

## 2022-07-12 DIAGNOSIS — E1140 Type 2 diabetes mellitus with diabetic neuropathy, unspecified: Secondary | ICD-10-CM | POA: Diagnosis not present

## 2022-07-12 DIAGNOSIS — Z9889 Other specified postprocedural states: Secondary | ICD-10-CM | POA: Diagnosis not present

## 2022-07-12 DIAGNOSIS — Z880 Allergy status to penicillin: Secondary | ICD-10-CM | POA: Diagnosis not present

## 2022-07-12 DIAGNOSIS — M65811 Other synovitis and tenosynovitis, right shoulder: Secondary | ICD-10-CM | POA: Diagnosis not present

## 2022-07-12 DIAGNOSIS — S46011D Strain of muscle(s) and tendon(s) of the rotator cuff of right shoulder, subsequent encounter: Secondary | ICD-10-CM | POA: Diagnosis not present

## 2022-07-12 DIAGNOSIS — G4730 Sleep apnea, unspecified: Secondary | ICD-10-CM | POA: Diagnosis not present

## 2022-07-12 DIAGNOSIS — M24111 Other articular cartilage disorders, right shoulder: Secondary | ICD-10-CM | POA: Diagnosis not present

## 2022-07-12 DIAGNOSIS — Z791 Long term (current) use of non-steroidal anti-inflammatories (NSAID): Secondary | ICD-10-CM | POA: Diagnosis not present

## 2022-07-12 DIAGNOSIS — M24811 Other specific joint derangements of right shoulder, not elsewhere classified: Secondary | ICD-10-CM | POA: Diagnosis not present

## 2022-07-12 DIAGNOSIS — S46011A Strain of muscle(s) and tendon(s) of the rotator cuff of right shoulder, initial encounter: Secondary | ICD-10-CM | POA: Diagnosis not present

## 2022-07-12 DIAGNOSIS — E785 Hyperlipidemia, unspecified: Secondary | ICD-10-CM | POA: Diagnosis not present

## 2022-07-12 DIAGNOSIS — F1290 Cannabis use, unspecified, uncomplicated: Secondary | ICD-10-CM | POA: Diagnosis not present

## 2022-07-16 ENCOUNTER — Ambulatory Visit (INDEPENDENT_AMBULATORY_CARE_PROVIDER_SITE_OTHER): Payer: BC Managed Care – PPO | Admitting: Physician Assistant

## 2022-07-16 ENCOUNTER — Encounter: Payer: Self-pay | Admitting: Physician Assistant

## 2022-07-16 VITALS — BP 148/90 | HR 97 | Temp 98.0°F | Resp 16 | Ht 73.0 in | Wt 379.0 lb

## 2022-07-16 DIAGNOSIS — Z9889 Other specified postprocedural states: Secondary | ICD-10-CM

## 2022-07-16 DIAGNOSIS — Z4789 Encounter for other orthopedic aftercare: Secondary | ICD-10-CM | POA: Diagnosis not present

## 2022-07-16 DIAGNOSIS — M25511 Pain in right shoulder: Secondary | ICD-10-CM | POA: Diagnosis not present

## 2022-07-16 DIAGNOSIS — I10 Essential (primary) hypertension: Secondary | ICD-10-CM

## 2022-07-16 DIAGNOSIS — G4733 Obstructive sleep apnea (adult) (pediatric): Secondary | ICD-10-CM

## 2022-07-16 NOTE — Progress Notes (Signed)
[facility] [place], [place] [address]  Internal MEDICINE  Office Visit Note  Patient Name: Victor Hale  [id number]  [id number]  Date of Service: 07/21/2022  Chief Complaint  Patient presents with   Follow-up    Sleep study     HPI Pt is here for routine follow up -He had his sleep study and has severe OSA with desats down to 66%. Needs to schedule titration study. Reports he was already called to schedule this, but had a lot going on and said he would call back and will do so now. Emphasized the severity and dangers of going untreated -recently bought a house and has been moving -Just had rotator cuff surgery last week and is in a lot of pain from this. Likely why BP up in office today -Tolerating wellbutrin and mind set is better -worried about weight still and discussed ability to exercise soon will help in addition to watching diet and continuing meds  Current Medication: Outpatient Encounter Medications as of 07/16/2022  Medication Sig   amLODipine (NORVASC) 2.5 MG tablet Take 1 tablet (2.5 mg total) by mouth daily.   atorvastatin (LIPITOR) 20 MG tablet TAKE 1 TABLET (20 MG TOTAL) BY MOUTH DAILY.   Blood Glucose Monitoring Suppl (TRUE METRIX METER) DEVI 1 each by Does not apply route daily before breakfast.   buPROPion (WELLBUTRIN XL) 150 MG 24 hr tablet Take 1 tablet (150 mg total) by mouth daily.   Dulaglutide (TRULICITY) 3 IH/4.7QQ SOPN Inject 3 mg as directed once a week.   gabapentin (NEURONTIN) 300 MG capsule Take 1 capsule (300 mg total) by mouth in the morning AND 2-3 capsules (600-900 mg total) at bedtime.   glucose blood (TRUE METRIX BLOOD GLUCOSE TEST) test strip Use daily before breakfast   Insulin Pen Needle 31G X 5 MM MISC 1 each by Does not apply route at bedtime.   meloxicam (MOBIC) 7.5 MG tablet TAKE 1 TABLET (7.5 MG TOTAL) BY MOUTH DAILY.   polyethylene glycol (GOLYTELY) 236 g solution Evening before colonoscopy at 5 pm fill  container with water.  Mix well. Drink 8 oz every 39mn until half contents have been completed.  Continue clear liquid diet. 5 hours prior to colonoscopy resume drinking prep. 8oz every 15 mins until entire contents have been completed.  Do not eat or drink anything 2 hours prior to colonoscopy.   sildenafil (VIAGRA) 50 MG tablet TAKE 1 TABLET (50 MG TOTAL) BY MOUTH DAILY AS NEEDED FOR ERECTILE DYSFUNCTION. AT LEAST 24 HOURS BETWEEN DOSES   TRUEplus Lancets 28G MISC 1 each by Does not apply route 3 (three) times daily before meals.   [DISCONTINUED] dicyclomine (BENTYL) 20 MG tablet Take 1 tablet (20 mg total) by mouth 2 (two) times daily. (Patient not taking: Reported on 07/01/2017)   [DISCONTINUED] famotidine (PEPCID) 20 MG tablet Take 1 tablet (20 mg total) by mouth 2 (two) times daily. (Patient not taking: Reported on 07/01/2017)   [DISCONTINUED] omeprazole (PRILOSEC) 20 MG capsule Take 1 capsule (20 mg total) by mouth daily. (Patient not taking: Reported on 07/01/2017)   No facility-administered encounter medications on file as of 07/16/2022.    Surgical History: Past Surgical History:  Procedure Laterality Date   COLONOSCOPY WITH PROPOFOL N/A 02/28/2022   Procedure: COLONOSCOPY WITH PROPOFOL;  Surgeon: AJonathon Bellows MD;  [facility];  Service: Gastroenterology;  Laterality: N/A;   ROTATOR CUFF REPAIR Right 12/07/2021    Medical History: Past Medical History:  Diagnosis Date  Diabetes mellitus without complication ([place])    Fatty liver    Hypertension     Family History: Family History  Problem Relation Age of Onset   Hypertension Mother    Diabetes Mother     Social History   Socioeconomic History   Marital status: Married    Spouse name: Not on file   Number of children: Not on file   Years of education: Not on file   Highest education level: Not on file  Occupational History   Not on file  Tobacco Use   Smoking status: Never   Smokeless tobacco: Never   Vaping Use   Vaping Use: Never used  Substance and Sexual Activity   Alcohol use: No   Drug use: Yes    Frequency: 6.0 times per week    Types: Marijuana   Sexual activity: Yes  Other Topics Concern   Not on file  Social History Narrative   Not on file   Social Determinants of Health   Financial Resource Strain: Not on file  Food Insecurity: Not on file  Transportation Needs: Not on file  Physical Activity: Not on file  Stress: Not on file  Social Connections: Not on file  Intimate Partner Violence: Not on file      Review of Systems  Constitutional:  Positive for fatigue. Negative for chills and unexpected weight change.  HENT:  Negative for congestion, postnasal drip, rhinorrhea, sneezing and sore throat.   Eyes:  Negative for redness.  Respiratory:  Negative for cough, chest tightness and shortness of breath.   Cardiovascular:  Negative for chest pain and palpitations.  Gastrointestinal:  Negative for abdominal pain, constipation, diarrhea, nausea and vomiting.  Genitourinary:  Negative for dysuria and frequency.  Musculoskeletal:  Positive for arthralgias. Negative for back pain, joint swelling and neck pain.  Skin:  Negative for rash.  Neurological: Negative.  Negative for tremors and numbness.  Hematological:  Negative for adenopathy. Does not bruise/bleed easily.  Psychiatric/Behavioral:  Positive for dysphoric mood and sleep disturbance. Negative for behavioral problems (Depression) and suicidal ideas. The patient is nervous/anxious.     Vital Signs: BP (!) 148/90   Pulse 97   Temp 98 F (36.7 C)   Resp 16   Ht '6\' 1"'$  (1.854 m)   Wt (!) 379 lb (171.9 kg)   SpO2 98%   BMI 50.00 kg/m    Physical Exam Vitals and nursing note reviewed.  Constitutional:      General: He is not in acute distress.    Appearance: He is well-developed. He is obese. He is not diaphoretic.  HENT:     Head: Normocephalic and atraumatic.     Mouth/Throat:     Pharynx: No  oropharyngeal exudate.  Eyes:     Pupils: Pupils are equal, round, and reactive to light.  Neck:     Thyroid: No thyromegaly.     Vascular: No JVD.     Trachea: No tracheal deviation.  Cardiovascular:     Rate and Rhythm: Normal rate and regular rhythm.     Heart sounds: Normal heart sounds. No murmur heard.    No friction rub. No gallop.  Pulmonary:     Effort: Pulmonary effort is normal. No respiratory distress.     Breath sounds: No wheezing or rales.  Chest:     Chest wall: No tenderness.  Abdominal:     General: Bowel sounds are normal.     Palpations: Abdomen is soft.  Musculoskeletal:  Cervical back: Normal range of motion and neck supple.     Comments: Right shoulder in sling  Lymphadenopathy:     Cervical: No cervical adenopathy.  Skin:    General: Skin is warm and dry.  Neurological:     Mental Status: He is alert and oriented to person, place, and time.     Cranial Nerves: No cranial nerve deficit.  Psychiatric:        Behavior: Behavior normal.        Thought Content: Thought content normal.        Judgment: Judgment normal.        Assessment/Plan: 1. Severe obstructive sleep apnea Discussed the severity of apnea and dangers of going untreated and pt will call to schedule titrate ASAP. Would benefit from titration due to severity as well oxygen desats  2. S/P shoulder surgery Followed by ortho and PT  3. Essential (primary) hypertension Likely elevated due to pain from shoulder surgery and PT prior to visit. Continue amlodipine and monitor   General Counseling: adyn serna understanding of the findings of todays visit and agrees with plan of treatment. I have discussed any further diagnostic evaluation that may be needed or ordered today. We also reviewed his medications today. he has been encouraged to call the office with any questions or concerns that should arise related to todays visit.    No orders of the defined types were placed in  this encounter.   No orders of the defined types were placed in this encounter.   This patient was seen by Drema Dallas, PA-C in collaboration with Dr. Clayborn Bigness as a part of collaborative care agreement.   Total time spent:30 Minutes Time spent includes review of chart, medications, test results, and follow up plan with the patient.      Dr Lavera Guise Internal medicine

## 2022-08-02 DIAGNOSIS — Z4789 Encounter for other orthopedic aftercare: Secondary | ICD-10-CM | POA: Diagnosis not present

## 2022-08-02 DIAGNOSIS — M25511 Pain in right shoulder: Secondary | ICD-10-CM | POA: Diagnosis not present

## 2022-08-09 ENCOUNTER — Other Ambulatory Visit: Payer: Self-pay | Admitting: Physician Assistant

## 2022-08-09 ENCOUNTER — Other Ambulatory Visit: Payer: Self-pay

## 2022-08-09 DIAGNOSIS — Z4789 Encounter for other orthopedic aftercare: Secondary | ICD-10-CM | POA: Diagnosis not present

## 2022-08-09 DIAGNOSIS — M25511 Pain in right shoulder: Secondary | ICD-10-CM | POA: Diagnosis not present

## 2022-08-09 MED ORDER — SILDENAFIL CITRATE 50 MG PO TABS
ORAL_TABLET | ORAL | 3 refills | Status: DC
  Filled 2022-08-09: qty 10, 30d supply, fill #0

## 2022-08-13 DIAGNOSIS — Z4789 Encounter for other orthopedic aftercare: Secondary | ICD-10-CM | POA: Diagnosis not present

## 2022-08-13 DIAGNOSIS — M25511 Pain in right shoulder: Secondary | ICD-10-CM | POA: Diagnosis not present

## 2022-08-15 ENCOUNTER — Other Ambulatory Visit: Payer: Self-pay

## 2022-08-16 DIAGNOSIS — M25511 Pain in right shoulder: Secondary | ICD-10-CM | POA: Diagnosis not present

## 2022-08-16 DIAGNOSIS — Z4789 Encounter for other orthopedic aftercare: Secondary | ICD-10-CM | POA: Diagnosis not present

## 2022-08-21 DIAGNOSIS — S46011D Strain of muscle(s) and tendon(s) of the rotator cuff of right shoulder, subsequent encounter: Secondary | ICD-10-CM | POA: Diagnosis not present

## 2022-08-21 DIAGNOSIS — M5412 Radiculopathy, cervical region: Secondary | ICD-10-CM | POA: Diagnosis not present

## 2022-08-21 DIAGNOSIS — W1839XD Other fall on same level, subsequent encounter: Secondary | ICD-10-CM | POA: Diagnosis not present

## 2022-08-21 DIAGNOSIS — Z9889 Other specified postprocedural states: Secondary | ICD-10-CM | POA: Diagnosis not present

## 2022-08-27 DIAGNOSIS — Z4789 Encounter for other orthopedic aftercare: Secondary | ICD-10-CM | POA: Diagnosis not present

## 2022-08-27 DIAGNOSIS — M25511 Pain in right shoulder: Secondary | ICD-10-CM | POA: Diagnosis not present

## 2022-09-03 DIAGNOSIS — M25511 Pain in right shoulder: Secondary | ICD-10-CM | POA: Diagnosis not present

## 2022-09-03 DIAGNOSIS — Z4789 Encounter for other orthopedic aftercare: Secondary | ICD-10-CM | POA: Diagnosis not present

## 2022-09-10 DIAGNOSIS — M25511 Pain in right shoulder: Secondary | ICD-10-CM | POA: Diagnosis not present

## 2022-09-10 DIAGNOSIS — Z4789 Encounter for other orthopedic aftercare: Secondary | ICD-10-CM | POA: Diagnosis not present

## 2022-09-17 ENCOUNTER — Ambulatory Visit: Payer: BC Managed Care – PPO | Admitting: Physician Assistant

## 2022-09-17 ENCOUNTER — Other Ambulatory Visit: Payer: Self-pay

## 2022-09-17 ENCOUNTER — Encounter: Payer: Self-pay | Admitting: Physician Assistant

## 2022-09-17 VITALS — BP 130/88 | HR 80 | Temp 98.3°F | Resp 16 | Ht 73.0 in | Wt 365.0 lb

## 2022-09-17 DIAGNOSIS — E1140 Type 2 diabetes mellitus with diabetic neuropathy, unspecified: Secondary | ICD-10-CM

## 2022-09-17 DIAGNOSIS — E538 Deficiency of other specified B group vitamins: Secondary | ICD-10-CM

## 2022-09-17 DIAGNOSIS — R5383 Other fatigue: Secondary | ICD-10-CM

## 2022-09-17 DIAGNOSIS — G629 Polyneuropathy, unspecified: Secondary | ICD-10-CM

## 2022-09-17 DIAGNOSIS — M25511 Pain in right shoulder: Secondary | ICD-10-CM

## 2022-09-17 DIAGNOSIS — G4733 Obstructive sleep apnea (adult) (pediatric): Secondary | ICD-10-CM

## 2022-09-17 DIAGNOSIS — E782 Mixed hyperlipidemia: Secondary | ICD-10-CM

## 2022-09-17 DIAGNOSIS — R946 Abnormal results of thyroid function studies: Secondary | ICD-10-CM

## 2022-09-17 DIAGNOSIS — I10 Essential (primary) hypertension: Secondary | ICD-10-CM | POA: Diagnosis not present

## 2022-09-17 DIAGNOSIS — Z4789 Encounter for other orthopedic aftercare: Secondary | ICD-10-CM | POA: Diagnosis not present

## 2022-09-17 DIAGNOSIS — E559 Vitamin D deficiency, unspecified: Secondary | ICD-10-CM

## 2022-09-17 LAB — POCT GLYCOSYLATED HEMOGLOBIN (HGB A1C): Hemoglobin A1C: 6.7 % — AB (ref 4.0–5.6)

## 2022-09-17 MED ORDER — MELOXICAM 7.5 MG PO TABS
7.5000 mg | ORAL_TABLET | Freq: Every day | ORAL | 3 refills | Status: AC
  Filled 2022-09-17: qty 30, 30d supply, fill #0
  Filled 2022-10-30: qty 30, 30d supply, fill #1

## 2022-09-17 MED ORDER — ATORVASTATIN CALCIUM 20 MG PO TABS
20.0000 mg | ORAL_TABLET | Freq: Every day | ORAL | 6 refills | Status: DC
  Filled 2022-09-17: qty 30, 30d supply, fill #0
  Filled 2022-10-30: qty 30, 30d supply, fill #1

## 2022-09-17 MED ORDER — TRULICITY 3 MG/0.5ML ~~LOC~~ SOAJ
3.0000 mg | SUBCUTANEOUS | 2 refills | Status: DC
  Filled 2022-09-17: qty 2, 28d supply, fill #0

## 2022-09-17 MED ORDER — GABAPENTIN 300 MG PO CAPS
ORAL_CAPSULE | ORAL | 1 refills | Status: DC
  Filled 2022-09-17: qty 120, 30d supply, fill #0
  Filled 2022-10-30: qty 120, 30d supply, fill #1

## 2022-09-17 NOTE — Progress Notes (Signed)
[facility] [place], [place] [address]  Internal MEDICINE  Office Visit Note  Patient Name: Victor Hale  [id number]  [id number]  Date of Service: 10/02/2022  Chief Complaint  Patient presents with   Follow-up   Hypertension   Diabetes    HPI Pt is here for routine follow up -PSG previously reviewed and showed severe OSA, but pt has not had titration. Claims he hasn't heard any more for sleep center. States he was told he was going straight to a machine, but missed his set up appt? Again discussed following up with sleep center to schedule titration vs setup depending on insurance approval. Stressed the importance of treating his OSA. -he is down 14lbs since last visit -He did have repeat right shoulder surgery and has been doing PT -Struggling with quitting marijuana, which triggers him to eat more. Offered referral for help with quitting but he declines  Current Medication: Outpatient Encounter Medications as of 09/17/2022  Medication Sig   amLODipine (NORVASC) 2.5 MG tablet Take 1 tablet (2.5 mg total) by mouth daily.   Blood Glucose Monitoring Suppl (TRUE METRIX METER) DEVI 1 each by Does not apply route daily before breakfast.   buPROPion (WELLBUTRIN XL) 150 MG 24 hr tablet Take 1 tablet (150 mg total) by mouth daily.   glucose blood (TRUE METRIX BLOOD GLUCOSE TEST) test strip Use daily before breakfast   Insulin Pen Needle 31G X 5 MM MISC 1 each by Does not apply route at bedtime.   polyethylene glycol (GOLYTELY) 236 g solution Evening before colonoscopy at 5 pm fill container with water.  Mix well. Drink 8 oz every 28mn until half contents have been completed.  Continue clear liquid diet. 5 hours prior to colonoscopy resume drinking prep. 8oz every 15 mins until entire contents have been completed.  Do not eat or drink anything 2 hours prior to colonoscopy.   sildenafil (VIAGRA) 50 MG tablet TAKE 1 TABLET (50 MG TOTAL) BY MOUTH DAILY AS NEEDED FOR  ERECTILE DYSFUNCTION. AT LEAST 24 HOURS BETWEEN DOSES   TRUEplus Lancets 28G MISC 1 each by Does not apply route 3 (three) times daily before meals.   [DISCONTINUED] atorvastatin (LIPITOR) 20 MG tablet TAKE 1 TABLET (20 MG TOTAL) BY MOUTH DAILY.   [DISCONTINUED] Dulaglutide (TRULICITY) 3 M0000000SOPN Inject 3 mg as directed once a week.   [DISCONTINUED] gabapentin (NEURONTIN) 300 MG capsule Take 1 capsule (300 mg total) by mouth in the morning AND 2-3 capsules (600-900 mg total) at bedtime.   [DISCONTINUED] meloxicam (MOBIC) 7.5 MG tablet TAKE 1 TABLET (7.5 MG TOTAL) BY MOUTH DAILY.   atorvastatin (LIPITOR) 20 MG tablet Take 1 tablet (20 mg total) by mouth daily.   Dulaglutide (TRULICITY) 3 M0000000SOPN Inject 3 mg as directed once a week.   gabapentin (NEURONTIN) 300 MG capsule Take 1 capsule (300 mg total) by mouth in the morning AND 2-3 capsules (600-900 mg total) at bedtime.   meloxicam (MOBIC) 7.5 MG tablet Take 1 tablet (7.5 mg total) by mouth daily.   [DISCONTINUED] dicyclomine (BENTYL) 20 MG tablet Take 1 tablet (20 mg total) by mouth 2 (two) times daily. (Patient not taking: Reported on 07/01/2017)   [DISCONTINUED] famotidine (PEPCID) 20 MG tablet Take 1 tablet (20 mg total) by mouth 2 (two) times daily. (Patient not taking: Reported on 07/01/2017)   [DISCONTINUED] omeprazole (PRILOSEC) 20 MG capsule Take 1 capsule (20 mg total) by mouth daily. (Patient not taking: Reported on 07/01/2017)  No facility-administered encounter medications on file as of 09/17/2022.    Surgical History: Past Surgical History:  Procedure Laterality Date   COLONOSCOPY WITH PROPOFOL N/A 02/28/2022   Procedure: COLONOSCOPY WITH PROPOFOL;  Surgeon: Jonathon Bellows, MD;  Location: [facility] ENDOSCOPY;  Service: Gastroenterology;  Laterality: N/A;   ROTATOR CUFF REPAIR Right 12/07/2021    Medical History: Past Medical History:  Diagnosis Date   Diabetes mellitus without complication ([place])    Fatty liver     Hypertension     Family History: Family History  Problem Relation Age of Onset   Hypertension Mother    Diabetes Mother     Social History   Socioeconomic History   Marital status: Married    Spouse name: Not on file   Number of children: Not on file   Years of education: Not on file   Highest education level: Not on file  Occupational History   Not on file  Tobacco Use   Smoking status: Never   Smokeless tobacco: Never  Vaping Use   Vaping Use: Never used  Substance and Sexual Activity   Alcohol use: No   Drug use: Yes    Frequency: 6.0 times per week    Types: Marijuana   Sexual activity: Yes  Other Topics Concern   Not on file  Social History Narrative   Not on file   Social Determinants of Health   Financial Resource Strain: Not on file  Food Insecurity: Not on file  Transportation Needs: Not on file  Physical Activity: Not on file  Stress: Not on file  Social Connections: Not on file  Intimate Partner Violence: Not on file      Review of Systems  Constitutional:  Positive for fatigue. Negative for chills and unexpected weight change.  HENT:  Negative for congestion, postnasal drip, rhinorrhea, sneezing and sore throat.   Eyes:  Negative for redness.  Respiratory:  Negative for cough, chest tightness and shortness of breath.   Cardiovascular:  Negative for chest pain and palpitations.  Gastrointestinal:  Negative for abdominal pain, constipation, diarrhea, nausea and vomiting.  Genitourinary:  Negative for dysuria and frequency.  Musculoskeletal:  Positive for arthralgias. Negative for back pain, joint swelling and neck pain.  Skin:  Negative for rash.  Neurological: Negative.  Negative for tremors and numbness.  Hematological:  Negative for adenopathy. Does not bruise/bleed easily.  Psychiatric/Behavioral:  Positive for dysphoric mood and sleep disturbance. Negative for behavioral problems (Depression) and suicidal ideas. The patient is  nervous/anxious.     Vital Signs: BP 130/88 Comment: 134/93  Pulse 80   Temp 98.3 F (36.8 C)   Resp 16   Ht '6\' 1"'$  (1.854 m)   Wt (!) 365 lb (165.6 kg)   SpO2 97%   BMI 48.16 kg/m    Physical Exam Vitals and nursing note reviewed.  Constitutional:      General: He is not in acute distress.    Appearance: He is well-developed. He is obese. He is not diaphoretic.  HENT:     Head: Normocephalic and atraumatic.     Mouth/Throat:     Pharynx: No oropharyngeal exudate.  Eyes:     Pupils: Pupils are equal, round, and reactive to light.  Neck:     Thyroid: No thyromegaly.     Vascular: No JVD.     Trachea: No tracheal deviation.  Cardiovascular:     Rate and Rhythm: Normal rate and regular rhythm.     Heart sounds: Normal  heart sounds. No murmur heard.    No friction rub. No gallop.  Pulmonary:     Effort: Pulmonary effort is normal. No respiratory distress.     Breath sounds: No wheezing or rales.  Chest:     Chest wall: No tenderness.  Abdominal:     General: Bowel sounds are normal.     Palpations: Abdomen is soft.  Musculoskeletal:     Cervical back: Normal range of motion and neck supple.  Lymphadenopathy:     Cervical: No cervical adenopathy.  Skin:    General: Skin is warm and dry.  Neurological:     Mental Status: He is alert and oriented to person, place, and time.     Cranial Nerves: No cranial nerve deficit.  Psychiatric:        Behavior: Behavior normal.        Thought Content: Thought content normal.        Judgment: Judgment normal.        Assessment/Plan: 1. Type 2 diabetes mellitus with diabetic neuropathy, without long-term current use of insulin (HCC) - POCT HgB A1C is 6.7 which is improved from 7.1 last visit. Continue current medications and working on diet and exercise - atorvastatin (LIPITOR) 20 MG tablet; Take 1 tablet (20 mg total) by mouth daily.  Dispense: 30 tablet; Refill: 6 - Dulaglutide (TRULICITY) 3 0000000 SOPN; Inject 3 mg as  directed once a week.  Dispense: 2 mL; Refill: 2  2. Severe obstructive sleep apnea Pt will call back to schedule  with sleep center for treatment ASAP  3. Essential (primary) hypertension Stable, continue current medication  4. Neuropathy - gabapentin (NEURONTIN) 300 MG capsule; Take 1 capsule (300 mg total) by mouth in the morning AND 2-3 capsules (600-900 mg total) at bedtime.  Dispense: 120 capsule; Refill: 1 - B12 and Folate Panel  5. Acute pain of right shoulder - meloxicam (MOBIC) 7.5 MG tablet; Take 1 tablet (7.5 mg total) by mouth daily.  Dispense: 30 tablet; Refill: 3  6. Vitamin D deficiency - VITAMIN D 25 Hydroxy (Vit-D Deficiency, Fractures)  7. Mixed hyperlipidemia - Lipid Panel With LDL/HDL Ratio  8. Abnormal thyroid exam - TSH + free T4  9. B12 deficiency - B12 and Folate Panel  10. Other fatigue - VITAMIN D 25 Hydroxy (Vit-D Deficiency, Fractures) - B12 and Folate Panel - TSH + free T4 - Lipid Panel With LDL/HDL Ratio - Comprehensive metabolic panel - CBC w/Diff/Platelet   General Counseling: Derec verbalizes understanding of the findings of todays visit and agrees with plan of treatment. I have discussed any further diagnostic evaluation that may be needed or ordered today. We also reviewed his medications today. he has been encouraged to call the office with any questions or concerns that should arise related to todays visit.    Orders Placed This Encounter  Procedures   VITAMIN D 25 Hydroxy (Vit-D Deficiency, Fractures)   B12 and Folate Panel   TSH + free T4   Lipid Panel With LDL/HDL Ratio   Comprehensive metabolic panel   CBC w/Diff/Platelet   POCT HgB A1C    Meds ordered this encounter  Medications   atorvastatin (LIPITOR) 20 MG tablet    Sig: Take 1 tablet (20 mg total) by mouth daily.    Dispense:  30 tablet    Refill:  6   gabapentin (NEURONTIN) 300 MG capsule    Sig: Take 1 capsule (300 mg total) by mouth in the morning AND 2-3  capsules (600-900 mg total) at bedtime.    Dispense:  120 capsule    Refill:  1    Dose increase   meloxicam (MOBIC) 7.5 MG tablet    Sig: Take 1 tablet (7.5 mg total) by mouth daily.    Dispense:  30 tablet    Refill:  3   Dulaglutide (TRULICITY) 3 0000000 SOPN    Sig: Inject 3 mg as directed once a week.    Dispense:  2 mL    Refill:  2    This patient was seen by Drema Dallas, PA-C in collaboration with Dr. Clayborn Bigness as a part of collaborative care agreement.   Total time spent:30 Minutes Time spent includes review of chart, medications, test results, and follow up plan with the patient.      Dr Lavera Guise Internal medicine

## 2022-09-18 ENCOUNTER — Other Ambulatory Visit: Payer: Self-pay

## 2022-09-21 ENCOUNTER — Other Ambulatory Visit
Admission: RE | Admit: 2022-09-21 | Discharge: 2022-09-21 | Disposition: A | Discharge status: Home / Self Care | Payer: BC Managed Care – PPO | Attending: Physician Assistant | Admitting: Physician Assistant

## 2022-09-21 DIAGNOSIS — E559 Vitamin D deficiency, unspecified: Secondary | ICD-10-CM | POA: Diagnosis not present

## 2022-09-21 DIAGNOSIS — R946 Abnormal results of thyroid function studies: Secondary | ICD-10-CM | POA: Diagnosis not present

## 2022-09-21 DIAGNOSIS — G629 Polyneuropathy, unspecified: Secondary | ICD-10-CM | POA: Diagnosis not present

## 2022-09-21 DIAGNOSIS — E538 Deficiency of other specified B group vitamins: Secondary | ICD-10-CM | POA: Diagnosis not present

## 2022-09-21 DIAGNOSIS — E782 Mixed hyperlipidemia: Secondary | ICD-10-CM | POA: Insufficient documentation

## 2022-09-21 DIAGNOSIS — R5383 Other fatigue: Secondary | ICD-10-CM | POA: Diagnosis not present

## 2022-09-21 LAB — CBC WITH DIFFERENTIAL/PLATELET
Abs Immature Granulocytes: 0.03 10*3/uL (ref 0.00–0.07)
Basophils Absolute: 0.1 10*3/uL (ref 0.0–0.1)
Basophils Relative: 1 %
Eosinophils Absolute: 0.2 10*3/uL (ref 0.0–0.5)
Eosinophils Relative: 3 %
HCT: 43.5 % (ref 39.0–52.0)
Hemoglobin: 14.7 g/dL (ref 13.0–17.0)
Immature Granulocytes: 0 %
Lymphocytes Relative: 33 %
Lymphs Abs: 2.3 10*3/uL (ref 0.7–4.0)
MCH: 30.6 pg (ref 26.0–34.0)
MCHC: 33.8 g/dL (ref 30.0–36.0)
MCV: 90.4 fL (ref 80.0–100.0)
Monocytes Absolute: 0.5 10*3/uL (ref 0.1–1.0)
Monocytes Relative: 7 %
Neutro Abs: 3.9 10*3/uL (ref 1.7–7.7)
Neutrophils Relative %: 56 %
Platelets: 292 10*3/uL (ref 150–400)
RBC: 4.81 MIL/uL (ref 4.22–5.81)
RDW: 12.4 % (ref 11.5–15.5)
WBC: 6.9 10*3/uL (ref 4.0–10.5)
nRBC: 0 % (ref 0.0–0.2)

## 2022-09-21 LAB — FOLATE: Folate: >40 ng/mL (ref >5.9)

## 2022-09-21 LAB — LIPID PANEL
Cholesterol: 200 mg/dL (ref 0–200)
HDL: 44 mg/dL (ref >40)
Total CHOL/HDL Ratio: 4.5 RATIO
Triglycerides: 150 mg/dL — ABNORMAL HIGH (ref <150)
VLDL: 30 mg/dL (ref 0–40)

## 2022-09-21 LAB — COMPREHENSIVE METABOLIC PANEL
ALT: 51 U/L — ABNORMAL HIGH (ref 0–44)
AST: 41 U/L (ref 15–41)
Albumin: 4.2 g/dL (ref 3.5–5.0)
Alkaline Phosphatase: 44 U/L (ref 38–126)
Anion gap: 8 (ref 5–15)
BUN: 10 mg/dL (ref 6–20)
CO2: 22 mmol/L (ref 22–32)
Calcium: 8.7 mg/dL — ABNORMAL LOW (ref 8.9–10.3)
Chloride: 106 mmol/L (ref 98–111)
Creatinine, Ser: 0.85 mg/dL (ref 0.61–1.24)
GFR, Estimated: >60 mL/min (ref >60)
Glucose, Bld: 118 mg/dL — ABNORMAL HIGH (ref 70–99)
Potassium: 3.5 mmol/L (ref 3.5–5.1)
Sodium: 136 mmol/L (ref 135–145)
Total Bilirubin: 0.9 mg/dL (ref 0.3–1.2)
Total Protein: 7.7 g/dL (ref 6.5–8.1)

## 2022-09-21 LAB — TSH: TSH: 2.09 u[IU]/mL (ref 0.350–4.500)

## 2022-09-21 LAB — VITAMIN B12: Vitamin B-12: 373 pg/mL (ref 180–914)

## 2022-09-21 LAB — T4, FREE: Free T4: 0.82 ng/dL (ref 0.61–1.12)

## 2022-09-22 LAB — MISC LABCORP TEST (SEND OUT): Labcorp test code: 81950

## 2022-09-24 DIAGNOSIS — Z4789 Encounter for other orthopedic aftercare: Secondary | ICD-10-CM | POA: Diagnosis not present

## 2022-09-24 DIAGNOSIS — M25511 Pain in right shoulder: Secondary | ICD-10-CM | POA: Diagnosis not present

## 2022-10-01 DIAGNOSIS — M25511 Pain in right shoulder: Secondary | ICD-10-CM | POA: Diagnosis not present

## 2022-10-01 DIAGNOSIS — Z4789 Encounter for other orthopedic aftercare: Secondary | ICD-10-CM | POA: Diagnosis not present

## 2022-10-04 ENCOUNTER — Telehealth: Payer: Self-pay

## 2022-10-04 NOTE — Telephone Encounter (Signed)
Spoke with patient regarding lab results. Patient says he will continue B12 supplements for now.

## 2022-10-04 NOTE — Telephone Encounter (Signed)
-----   Message from Mylinda Latina, PA-C sent at 10/03/2022 12:59 PM EST ----- Please let pt know he has Low calcium and vit D and should supplement OTC. Low B12 and can offer b12 injections

## 2022-10-09 DIAGNOSIS — M25511 Pain in right shoulder: Secondary | ICD-10-CM | POA: Diagnosis not present

## 2022-10-09 DIAGNOSIS — Z4789 Encounter for other orthopedic aftercare: Secondary | ICD-10-CM | POA: Diagnosis not present

## 2022-10-26 ENCOUNTER — Telehealth: Payer: Self-pay | Admitting: Physician Assistant

## 2022-10-26 DIAGNOSIS — M25511 Pain in right shoulder: Secondary | ICD-10-CM | POA: Diagnosis not present

## 2022-10-26 DIAGNOSIS — Z4789 Encounter for other orthopedic aftercare: Secondary | ICD-10-CM | POA: Diagnosis not present

## 2022-10-26 NOTE — Telephone Encounter (Signed)
Patient called wanting to cancel his sleep study with feeling great. Patient was originally scheduled for 10/25/22. No show to appt. tat

## 2022-10-30 ENCOUNTER — Other Ambulatory Visit: Payer: Self-pay

## 2022-11-01 DIAGNOSIS — M25511 Pain in right shoulder: Secondary | ICD-10-CM | POA: Diagnosis not present

## 2022-11-01 DIAGNOSIS — Z4789 Encounter for other orthopedic aftercare: Secondary | ICD-10-CM | POA: Diagnosis not present

## 2022-11-07 LAB — HM DIABETES EYE EXAM

## 2022-11-09 DIAGNOSIS — M25511 Pain in right shoulder: Secondary | ICD-10-CM | POA: Diagnosis not present

## 2022-11-09 DIAGNOSIS — Z4789 Encounter for other orthopedic aftercare: Secondary | ICD-10-CM | POA: Diagnosis not present

## 2022-11-14 DIAGNOSIS — Z4789 Encounter for other orthopedic aftercare: Secondary | ICD-10-CM | POA: Diagnosis not present

## 2022-11-14 DIAGNOSIS — M25511 Pain in right shoulder: Secondary | ICD-10-CM | POA: Diagnosis not present

## 2022-11-21 DIAGNOSIS — Z4789 Encounter for other orthopedic aftercare: Secondary | ICD-10-CM | POA: Diagnosis not present

## 2022-11-21 DIAGNOSIS — M25511 Pain in right shoulder: Secondary | ICD-10-CM | POA: Diagnosis not present

## 2022-11-28 DIAGNOSIS — M25511 Pain in right shoulder: Secondary | ICD-10-CM | POA: Diagnosis not present

## 2022-11-28 DIAGNOSIS — Z4789 Encounter for other orthopedic aftercare: Secondary | ICD-10-CM | POA: Diagnosis not present

## 2023-02-21 ENCOUNTER — Encounter: Payer: Self-pay | Admitting: Physician Assistant

## 2023-03-04 ENCOUNTER — Encounter: Payer: Self-pay | Admitting: Physician Assistant

## 2023-05-09 ENCOUNTER — Telehealth: Payer: Self-pay | Admitting: Physician Assistant

## 2023-05-09 NOTE — Telephone Encounter (Signed)
Left vm to confirm 05/16/23 appointment-Toni

## 2023-05-16 ENCOUNTER — Other Ambulatory Visit: Payer: Self-pay

## 2023-05-16 ENCOUNTER — Encounter: Payer: Self-pay | Admitting: Physician Assistant

## 2023-05-16 ENCOUNTER — Ambulatory Visit: Payer: Self-pay | Admitting: Physician Assistant

## 2023-05-16 VITALS — BP 138/88 | HR 84 | Temp 98.0°F | Resp 16 | Ht 73.0 in | Wt 371.0 lb

## 2023-05-16 DIAGNOSIS — E1140 Type 2 diabetes mellitus with diabetic neuropathy, unspecified: Secondary | ICD-10-CM

## 2023-05-16 DIAGNOSIS — Z0001 Encounter for general adult medical examination with abnormal findings: Secondary | ICD-10-CM

## 2023-05-16 DIAGNOSIS — G4733 Obstructive sleep apnea (adult) (pediatric): Secondary | ICD-10-CM

## 2023-05-16 DIAGNOSIS — E782 Mixed hyperlipidemia: Secondary | ICD-10-CM

## 2023-05-16 DIAGNOSIS — I10 Essential (primary) hypertension: Secondary | ICD-10-CM

## 2023-05-16 DIAGNOSIS — Z6841 Body Mass Index (BMI) 40.0 and over, adult: Secondary | ICD-10-CM

## 2023-05-16 DIAGNOSIS — Z91148 Patient's other noncompliance with medication regimen for other reason: Secondary | ICD-10-CM

## 2023-05-16 MED ORDER — ATORVASTATIN CALCIUM 20 MG PO TABS
20.0000 mg | ORAL_TABLET | Freq: Every day | ORAL | 6 refills | Status: AC
  Filled 2023-05-16: qty 30, 30d supply, fill #0
  Filled 2024-04-09: qty 30, 30d supply, fill #1
  Filled 2024-05-14: qty 30, 30d supply, fill #2

## 2023-05-16 MED ORDER — TRULICITY 0.75 MG/0.5ML ~~LOC~~ SOAJ
0.7500 mg | SUBCUTANEOUS | 2 refills | Status: DC
  Filled 2023-05-16: qty 2, 28d supply, fill #0
  Filled 2024-04-09: qty 2, 28d supply, fill #1
  Filled 2024-05-14: qty 2, 28d supply, fill #2

## 2023-05-16 MED ORDER — SILDENAFIL CITRATE 50 MG PO TABS
50.0000 mg | ORAL_TABLET | Freq: Every day | ORAL | 0 refills | Status: AC
  Filled 2023-05-16: qty 10, 30d supply, fill #0

## 2023-05-16 NOTE — Progress Notes (Signed)
[facility] [address] [place], [place] [address]  Internal MEDICINE  Office Visit Note  Patient Name: Victor Hale  [id number]  [id number]  Date of Service: 05/28/2023  Chief Complaint  Patient presents with   Annual Exam   Hypertension   Diabetes     HPI Pt is here for routine health maintenance examination -Stopped sodas and bread and thought he could stop his meds -he is not taking any of his medications -he is going to the gym now and is in PT still for his shoulder -never did follow up with cpap despite very severe apnea. Again discussed importance of this -Open to starting back on trulicity to help BG and wt and taking statin. May hold on amlodipine and monitor BP for now -Has been less sleepy since stopping gabapentin and is doing well without this now  Current Medication: Outpatient Encounter Medications as of 05/16/2023  Medication Sig   amLODipine (NORVASC) 2.5 MG tablet Take 1 tablet (2.5 mg total) by mouth daily.   Blood Glucose Monitoring Suppl (TRUE METRIX METER) DEVI 1 each by Does not apply route daily before breakfast.   Dulaglutide (TRULICITY) 0.75 MG/0.5ML SOPN Inject 0.75 mg into the skin once a week.   glucose blood (TRUE METRIX BLOOD GLUCOSE TEST) test strip Use daily before breakfast   Insulin Pen Needle 31G X 5 MM MISC 1 each by Does not apply route at bedtime.   meloxicam (MOBIC) 7.5 MG tablet Take 1 tablet (7.5 mg total) by mouth daily.   polyethylene glycol (GOLYTELY) 236 g solution Evening before colonoscopy at 5 pm fill container with water.  Mix well. Drink 8 oz every until half contents have been completed.  Continue clear liquid diet. 5 hours prior to colonoscopy resume drinking prep. 8oz every 15 mins until entire contents have been completed.  Do not eat or drink anything 2 hours prior to colonoscopy.   TRUEplus Lancets 28G MISC 1 each by Does not apply route 3 (three) times daily before meals.   [DISCONTINUED]  atorvastatin (LIPITOR) 20 MG tablet Take 1 tablet (20 mg total) by mouth daily.   [DISCONTINUED] buPROPion (WELLBUTRIN XL) 150 MG 24 hr tablet Take 1 tablet (150 mg total) by mouth daily.   [DISCONTINUED] Dulaglutide (TRULICITY) 3 MG/0.5ML SOPN Inject 3 mg as directed once a week.   [DISCONTINUED] gabapentin (NEURONTIN) 300 MG capsule Take 1 capsule (300 mg total) by mouth in the morning AND 2-3 capsules (600-900 mg total) at bedtime.   [DISCONTINUED] sildenafil (VIAGRA) 50 MG tablet TAKE 1 TABLET (50 MG TOTAL) BY MOUTH DAILY AS NEEDED FOR ERECTILE DYSFUNCTION. AT LEAST 24 HOURS BETWEEN DOSES   atorvastatin (LIPITOR) 20 MG tablet Take 1 tablet (20 mg total) by mouth daily.   sildenafil (VIAGRA) 50 MG tablet TAKE 1 TABLET (50 MG TOTAL) BY MOUTH DAILY AS NEEDED FOR ERECTILE DYSFUNCTION. AT LEAST 24 HOURS BETWEEN DOSES   [DISCONTINUED] dicyclomine (BENTYL) 20 MG tablet Take 1 tablet (20 mg total) by mouth 2 (two) times daily. (Patient not taking: Reported on 07/01/2017)   [DISCONTINUED] famotidine (PEPCID) 20 MG tablet Take 1 tablet (20 mg total) by mouth 2 (two) times daily. (Patient not taking: Reported on 07/01/2017)   [DISCONTINUED] omeprazole (PRILOSEC) 20 MG capsule Take 1 capsule (20 mg total) by mouth daily. (Patient not taking: Reported on 07/01/2017)   No facility-administered encounter medications on file as of 05/16/2023.    Surgical History: Past Surgical History:  Procedure Laterality Date   COLONOSCOPY  WITH PROPOFOL N/A 02/28/2022   Procedure: COLONOSCOPY WITH PROPOFOL;  Surgeon: Wyline Mood, MD;  Location: [facility] ENDOSCOPY;  Service: Gastroenterology;  Laterality: N/A;   ROTATOR CUFF REPAIR Right 12/07/2021    Medical History: Past Medical History:  Diagnosis Date   Diabetes mellitus without complication (HCC)    Fatty liver    Hypertension     Family History: Family History  Problem Relation Age of Onset   Hypertension Mother    Diabetes Mother       Review of  Systems  Constitutional:  Positive for fatigue. Negative for chills and unexpected weight change.  HENT:  Negative for congestion, postnasal drip, rhinorrhea, sneezing and sore throat.   Eyes:  Negative for redness.  Respiratory:  Negative for cough, chest tightness and shortness of breath.   Cardiovascular:  Negative for chest pain and palpitations.  Gastrointestinal:  Negative for abdominal pain, constipation, diarrhea, nausea and vomiting.  Genitourinary:  Negative for dysuria and frequency.  Musculoskeletal:  Positive for arthralgias. Negative for back pain, joint swelling and neck pain.  Skin:  Negative for rash.  Neurological: Negative.  Negative for tremors and numbness.  Hematological:  Negative for adenopathy. Does not bruise/bleed easily.  Psychiatric/Behavioral:  Positive for sleep disturbance. Negative for behavioral problems (Depression) and suicidal ideas. The patient is not nervous/anxious.      Vital Signs: BP 138/88   Pulse 84   Temp 98 F (36.7 C)   Resp 16   Ht 6\' 1"  (1.854 m)   Wt (!) 371 lb (168.3 kg)   SpO2 94%   BMI 48.95 kg/m    Physical Exam Vitals and nursing note reviewed.  Constitutional:      General: He is not in acute distress.    Appearance: He is well-developed. He is obese. He is not diaphoretic.  HENT:     Head: Normocephalic and atraumatic.     Mouth/Throat:     Pharynx: No oropharyngeal exudate.  Eyes:     Pupils: Pupils are equal, round, and reactive to light.  Neck:     Thyroid: No thyromegaly.     Vascular: No JVD.     Trachea: No tracheal deviation.  Cardiovascular:     Rate and Rhythm: Normal rate and regular rhythm.     Heart sounds: Normal heart sounds. No murmur heard.    No friction rub. No gallop.  Pulmonary:     Effort: Pulmonary effort is normal. No respiratory distress.     Breath sounds: No wheezing or rales.  Chest:     Chest wall: No tenderness.  Abdominal:     General: Bowel sounds are normal.     Palpations:  Abdomen is soft.  Musculoskeletal:     Cervical back: Normal range of motion and neck supple.  Lymphadenopathy:     Cervical: No cervical adenopathy.  Skin:    General: Skin is warm and dry.  Neurological:     Mental Status: He is alert and oriented to person, place, and time.     Cranial Nerves: No cranial nerve deficit.  Psychiatric:        Behavior: Behavior normal.        Thought Content: Thought content normal.        Judgment: Judgment normal.      LABS: No results found for this or any previous visit (from the past 2160 hour(s)).      Assessment/Plan: 1. Encounter for general adult medical examination with abnormal findings CPE performed, labs  previously reviewed. Discussed diabetic eye exam  2. Type 2 diabetes mellitus with diabetic neuropathy, without long-term current use of insulin (HCC) Will restart trulicity and plan to recheck A1c next visit - Dulaglutide (TRULICITY) 0.75 MG/0.5ML SOPN; Inject 0.75 mg into the skin once a week.  Dispense: 2 mL; Refill: 2 - atorvastatin (LIPITOR) 20 MG tablet; Take 1 tablet (20 mg total) by mouth daily.  Dispense: 30 tablet; Refill: 6  3. Severe obstructive sleep apnea Again discussed importance of following up on this and starting treatment  4. Essential (primary) hypertension May continue to hold amlodipine since BP stable in office, but advised to monitor  5. Mixed hyperlipidemia Restart lipitor  6. Morbid obesity with BMI of 45.0-49.9, adult (HCC) Continue to work on diet and exercise and will restart trulicity  7. Noncompliance with medication regimen Discussed need for adherence to medication and treatment regimen.   General Counseling: dawn avila understanding of the findings of todays visit and agrees with plan of treatment. I have discussed any further diagnostic evaluation that may be needed or ordered today. We also reviewed his medications today. he has been encouraged to call the office with any  questions or concerns that should arise related to todays visit.    Counseling:    No orders of the defined types were placed in this encounter.   Meds ordered this encounter  Medications   Dulaglutide (TRULICITY) 0.75 MG/0.5ML SOPN    Sig: Inject 0.75 mg into the skin once a week.    Dispense:  2 mL    Refill:  2   atorvastatin (LIPITOR) 20 MG tablet    Sig: Take 1 tablet (20 mg total) by mouth daily.    Dispense:  30 tablet    Refill:  6   sildenafil (VIAGRA) 50 MG tablet    Sig: TAKE 1 TABLET (50 MG TOTAL) BY MOUTH DAILY AS NEEDED FOR ERECTILE DYSFUNCTION. AT LEAST 24 HOURS BETWEEN DOSES    Dispense:  10 tablet    Refill:  0    This patient was seen by Lynn Ito, PA-C in collaboration with Dr. Beverely Risen as a part of collaborative care agreement.  Total time spent:40 Minutes  Time spent includes review of chart, medications, test results, and follow up plan with the patient.     Lyndon Code, MD  Internal Medicine

## 2023-06-11 ENCOUNTER — Other Ambulatory Visit: Payer: Self-pay

## 2023-06-11 ENCOUNTER — Encounter (HOSPITAL_COMMUNITY): Payer: Self-pay

## 2023-06-11 ENCOUNTER — Emergency Department (HOSPITAL_COMMUNITY)
Admission: EM | Admit: 2023-06-11 | Discharge: 2023-06-11 | Disposition: A | Discharge status: Home / Self Care | Payer: BC Managed Care – PPO | Attending: Emergency Medicine | Admitting: Emergency Medicine

## 2023-06-11 ENCOUNTER — Emergency Department (HOSPITAL_COMMUNITY): Payer: BC Managed Care – PPO

## 2023-06-11 DIAGNOSIS — Z7984 Long term (current) use of oral hypoglycemic drugs: Secondary | ICD-10-CM | POA: Insufficient documentation

## 2023-06-11 DIAGNOSIS — J181 Lobar pneumonia, unspecified organism: Secondary | ICD-10-CM | POA: Insufficient documentation

## 2023-06-11 DIAGNOSIS — Z794 Long term (current) use of insulin: Secondary | ICD-10-CM | POA: Insufficient documentation

## 2023-06-11 DIAGNOSIS — D72829 Elevated white blood cell count, unspecified: Secondary | ICD-10-CM | POA: Insufficient documentation

## 2023-06-11 DIAGNOSIS — J189 Pneumonia, unspecified organism: Secondary | ICD-10-CM

## 2023-06-11 DIAGNOSIS — J029 Acute pharyngitis, unspecified: Secondary | ICD-10-CM

## 2023-06-11 DIAGNOSIS — Z1152 Encounter for screening for COVID-19: Secondary | ICD-10-CM | POA: Insufficient documentation

## 2023-06-11 DIAGNOSIS — I10 Essential (primary) hypertension: Secondary | ICD-10-CM | POA: Insufficient documentation

## 2023-06-11 DIAGNOSIS — E119 Type 2 diabetes mellitus without complications: Secondary | ICD-10-CM | POA: Insufficient documentation

## 2023-06-11 DIAGNOSIS — Z79899 Other long term (current) drug therapy: Secondary | ICD-10-CM | POA: Insufficient documentation

## 2023-06-11 LAB — URINALYSIS, ROUTINE W REFLEX MICROSCOPIC
Bacteria, UA: NONE SEEN
Bilirubin Urine: NEGATIVE
Glucose, UA: NEGATIVE mg/dL
Ketones, ur: NEGATIVE mg/dL
Nitrite: NEGATIVE
Protein, ur: NEGATIVE mg/dL
Specific Gravity, Urine: 1.013 (ref 1.005–1.030)
pH: 5 (ref 5.0–8.0)

## 2023-06-11 LAB — CBC
HCT: 46.6 % (ref 39.0–52.0)
Hemoglobin: 15.6 g/dL (ref 13.0–17.0)
MCH: 31.1 pg (ref 26.0–34.0)
MCHC: 33.5 g/dL (ref 30.0–36.0)
MCV: 93 fL (ref 80.0–100.0)
Platelets: 324 10*3/uL (ref 150–400)
RBC: 5.01 MIL/uL (ref 4.22–5.81)
RDW: 12.2 % (ref 11.5–15.5)
WBC: 10.8 10*3/uL — ABNORMAL HIGH (ref 4.0–10.5)
nRBC: 0 % (ref 0.0–0.2)

## 2023-06-11 LAB — COMPREHENSIVE METABOLIC PANEL
ALT: 35 U/L (ref 0–44)
AST: 24 U/L (ref 15–41)
Albumin: 3.7 g/dL (ref 3.5–5.0)
Alkaline Phosphatase: 58 U/L (ref 38–126)
Anion gap: 13 (ref 5–15)
BUN: 11 mg/dL (ref 6–20)
CO2: 21 mmol/L — ABNORMAL LOW (ref 22–32)
Calcium: 9.3 mg/dL (ref 8.9–10.3)
Chloride: 104 mmol/L (ref 98–111)
Creatinine, Ser: 0.98 mg/dL (ref 0.61–1.24)
GFR, Estimated: >60 mL/min (ref >60)
Glucose, Bld: 155 mg/dL — ABNORMAL HIGH (ref 70–99)
Potassium: 4.2 mmol/L (ref 3.5–5.1)
Sodium: 138 mmol/L (ref 135–145)
Total Bilirubin: 0.5 mg/dL (ref <1.2)
Total Protein: 7.9 g/dL (ref 6.5–8.1)

## 2023-06-11 LAB — MONONUCLEOSIS SCREEN: Mono Screen: NEGATIVE

## 2023-06-11 LAB — RESP PANEL BY RT-PCR (RSV, FLU A&B, COVID)  RVPGX2
Influenza A by PCR: NEGATIVE
Influenza B by PCR: NEGATIVE
Resp Syncytial Virus by PCR: NEGATIVE
SARS Coronavirus 2 by RT PCR: NEGATIVE

## 2023-06-11 LAB — BRAIN NATRIURETIC PEPTIDE: B Natriuretic Peptide: 34.1 pg/mL (ref 0.0–100.0)

## 2023-06-11 LAB — D-DIMER, QUANTITATIVE: D-Dimer, Quant: <0.27 ug{FEU}/mL (ref 0.00–0.50)

## 2023-06-11 LAB — TROPONIN I (HIGH SENSITIVITY): Troponin I (High Sensitivity): 7 ng/L (ref <18)

## 2023-06-11 MED ORDER — LEVOFLOXACIN 750 MG PO TABS
750.0000 mg | ORAL_TABLET | Freq: Every day | ORAL | 0 refills | Status: AC
  Filled 2023-06-11: qty 5, 5d supply, fill #0

## 2023-06-11 MED ORDER — LEVOFLOXACIN 750 MG PO TABS
750.0000 mg | ORAL_TABLET | Freq: Once | ORAL | Status: AC
  Administered 2023-06-11: 750 mg via ORAL
  Filled 2023-06-11: qty 1

## 2023-06-11 MED ORDER — LIDOCAINE VISCOUS HCL 2 % MT SOLN
15.0000 mL | Freq: Once | OROMUCOSAL | Status: AC
  Administered 2023-06-11: 15 mL via OROMUCOSAL
  Filled 2023-06-11: qty 15

## 2023-06-11 MED ORDER — DEXAMETHASONE SODIUM PHOSPHATE 10 MG/ML IJ SOLN
10.0000 mg | Freq: Once | INTRAMUSCULAR | Status: AC
  Administered 2023-06-11: 10 mg via INTRAMUSCULAR
  Filled 2023-06-11: qty 1

## 2023-06-11 NOTE — Discharge Instructions (Addendum)
It was a pleasure taking care of you today.  As discussed, your chest x-ray showed possible pneumonia.  I am sending you home with antibiotics.  Take as prescribed and finish all antibiotics.  Your other labs are reassuring.  Please follow-up with PCP if symptoms do not improve over the next few days.  Return to the ER for any worsening symptoms.

## 2023-06-11 NOTE — Progress Notes (Signed)
Visited with pt. And provided listening , emotional and spiritual support.     Venida Jarvis, [place], [facility], Pager [phone]

## 2023-06-11 NOTE — ED Notes (Signed)
Pt was stuck twice. Wasn't successful.

## 2023-06-11 NOTE — ED Triage Notes (Addendum)
Pt states went to [facility] last week for strep throat and took all of the atx, but his throat still hurts. Pt c/o dysphagia. Pt states he's urinating a lot. Pt states he has trouble breathing at night and has SOB now. Pt is able to speak in complete sentences. Pt c/o fatigue. Pt states this has been going on for the past 3 wks.

## 2023-06-11 NOTE — ED Provider Notes (Signed)
[place] EMERGENCY DEPARTMENT AT [facility] Provider Note   CSN: [id number] Arrival date & time: 06/11/23  0840     History  No chief complaint on file.   Victor Hale is a 47 y.o. male with a past medical history significant for diabetes and hypertension who presents to the ED due to multiple complaints of shortness of breath, pleuritic chest pain, sore throat, and fatigue.  Patient states shortness of breath started 3 weeks ago.  Worse with exertion.  Also admits to orthopnea.  No history of CHF.  Admits to intermittent lower extremity edema typically to bilateral feet.  No history of blood clots, recent surgeries, or recent long immobilizations.  Patient also admits to persistent sore throat and difficulties swallowing both liquids and solids.  He was recently treated for strep throat and UTI and discharged with azithromycin and ciprofloxacin which he completed.  Denies changes to phonation.  No fever or chills.   History obtained from patient and past medical records. No interpreter used during encounter.       Home Medications Prior to Admission medications   Medication Sig Start Date End Date Taking? Authorizing Provider  levofloxacin (LEVAQUIN) 750 MG tablet Take 1 tablet (750 mg total) by mouth daily for 5 days. 06/11/23 06/16/23 Yes Nickayla Mcinnis, Merla Riches, PA-C  amLODipine (NORVASC) 2.5 MG tablet Take 1 tablet (2.5 mg total) by mouth daily. 06/18/22   McDonough, Salomon Fick, PA-C  atorvastatin (LIPITOR) 20 MG tablet Take 1 tablet (20 mg total) by mouth daily. 05/16/23   McDonough, Salomon Fick, PA-C  Blood Glucose Monitoring Suppl (TRUE METRIX METER) DEVI 1 each by Does not apply route daily before breakfast. 06/23/19   Newlin, Odette Horns, MD  Dulaglutide (TRULICITY) 0.75 MG/0.5ML SOPN Inject 0.75 mg into the skin once a week. 05/16/23   McDonough, Lauren K, PA-C  glucose blood (TRUE METRIX BLOOD GLUCOSE TEST) test strip Use daily before breakfast 08/09/20   Hoy Register,  MD  Insulin Pen Needle 31G X 5 MM MISC 1 each by Does not apply route at bedtime. 10/26/19   Hoy Register, MD  meloxicam (MOBIC) 7.5 MG tablet Take 1 tablet (7.5 mg total) by mouth daily. 09/17/22   McDonough, Salomon Fick, PA-C  polyethylene glycol (GOLYTELY) 236 g solution Evening before colonoscopy at 5 pm fill container with water.  Mix well. Drink 8 oz every until half contents have been completed.  Continue clear liquid diet. 5 hours prior to colonoscopy resume drinking prep. 8oz every 15 mins until entire contents have been completed.  Do not eat or drink anything 2 hours prior to colonoscopy. 02/16/22   Wyline Mood, MD  sildenafil (VIAGRA) 50 MG tablet TAKE 1 TABLET (50 MG TOTAL) BY MOUTH DAILY AS NEEDED FOR ERECTILE DYSFUNCTION. AT LEAST 24 HOURS BETWEEN DOSES 05/16/23   McDonough, Salomon Fick, PA-C  TRUEplus Lancets 28G MISC 1 each by Does not apply route 3 (three) times daily before meals. 06/23/19   Hoy Register, MD  famotidine (PEPCID) 20 MG tablet Take 1 tablet (20 mg total) by mouth 2 (two) times daily. Patient not taking: Reported on 07/01/2017 10/22/15 01/31/19  Emily Filbert, MD  omeprazole (PRILOSEC) 20 MG capsule Take 1 capsule (20 mg total) by mouth daily. Patient not taking: Reported on 07/01/2017 10/12/14 01/31/19  Everlene Farrier, PA-C      Allergies    Penicillins    Review of Systems   Review of Systems  Constitutional:  Positive for fatigue. Negative for fever.  HENT:  Positive for sore throat and trouble swallowing. Negative for voice change.   Respiratory:  Positive for shortness of breath.   Cardiovascular:  Positive for chest pain.    Physical Exam Updated Vital Signs BP (!) 138/103 (BP Location: Left Arm)   Pulse 90   Temp 99 F (37.2 C) (Oral)   Resp 18   Ht 6\' 1"  (1.854 m)   Wt (!) 168.3 kg   SpO2 96%   BMI 48.95 kg/m  Physical Exam Vitals and nursing note reviewed.  Constitutional:      General: He is not in acute distress.    Appearance:  He is not ill-appearing.  HENT:     Head: Normocephalic.     Mouth/Throat:     Comments: 2+ tonsillar hypertrophy bilaterally.  Uvula midline.  No exudates.  No abscess.  Tolerating oral secretions without difficulty. Eyes:     Pupils: Pupils are equal, round, and reactive to light.  Cardiovascular:     Rate and Rhythm: Normal rate and regular rhythm.     Pulses: Normal pulses.     Heart sounds: Normal heart sounds. No murmur heard.    No friction rub. No gallop.  Pulmonary:     Effort: Pulmonary effort is normal.     Breath sounds: Normal breath sounds.     Comments: Respirations equal and unlabored, patient able to speak in full sentences, lungs clear to auscultation bilaterally.  No stridor or wheeze. Abdominal:     General: Abdomen is flat. There is no distension.     Palpations: Abdomen is soft.     Tenderness: There is no abdominal tenderness. There is no guarding or rebound.  Musculoskeletal:        General: Normal range of motion.     Cervical back: Neck supple.     Comments: No lower extremity edema.  Skin:    General: Skin is warm and dry.  Neurological:     General: No focal deficit present.     Mental Status: He is alert.  Psychiatric:        Mood and Affect: Mood normal.        Behavior: Behavior normal.     ED Results / Procedures / Treatments   Labs (all labs ordered are listed, but only abnormal results are displayed) Labs Reviewed  COMPREHENSIVE METABOLIC PANEL - Abnormal; Notable for the following components:      Result Value   CO2 21 (*)    Glucose, Bld 155 (*)    All other components within normal limits  CBC - Abnormal; Notable for the following components:   WBC 10.8 (*)    All other components within normal limits  URINALYSIS, ROUTINE W REFLEX MICROSCOPIC - Abnormal; Notable for the following components:   Hgb urine dipstick SMALL (*)    Leukocytes,Ua TRACE (*)    All other components within normal limits  RESP PANEL BY RT-PCR (RSV, FLU A&B,  COVID)  RVPGX2  MONONUCLEOSIS SCREEN  BRAIN NATRIURETIC PEPTIDE  D-DIMER, QUANTITATIVE  GC/CHLAMYDIA PROBE AMP ([place]) NOT AT [facility]  TROPONIN I (HIGH SENSITIVITY)    EKG EKG Interpretation Date/Time:  Tuesday June 11 2023 08:58:08 EST Ventricular Rate:  96 PR Interval:  164 QRS Duration:  78 QT Interval:  370 QTC Calculation: 467 R Axis:   83  Text Interpretation: Normal sinus rhythm Normal ECG When compared with ECG of 01-Jul-2017 13:39, PREVIOUS ECG IS PRESENT Confirmed by Cathren Laine ([id number]) on 06/11/2023 12:17:41 PM  Radiology DG Chest 1 View  Result Date: 06/11/2023 CLINICAL DATA:  Shortness of breath EXAM: CHEST  1 VIEW COMPARISON:  07/01/2017 FINDINGS: Mild cardiomegaly. Low lung volumes. Left basilar opacities. No pleural effusion or pneumothorax. No acute osseous abnormality. IMPRESSION: Low lung volumes with left basilar opacities, which could represent atelectasis or infection. Electronically Signed   By: Wiliam Ke M.D.   On: 06/11/2023 11:12    Procedures Procedures    Medications Ordered in ED Medications  dexamethasone (DECADRON) injection 10 mg (10 mg Intramuscular Given 06/11/23 1026)  lidocaine (XYLOCAINE) 2 % viscous mouth solution 15 mL (15 mLs Mouth/Throat Given 06/11/23 1028)  levofloxacin (LEVAQUIN) tablet 750 mg (750 mg Oral Given 06/11/23 1225)    ED Course/ Medical Decision Making/ A&P Clinical Course as of 06/11/23 1248  Tue Jun 11, 2023  1212 WBC(!): 10.8 [CA]    Clinical Course User Index [CA] Mannie Stabile, PA-C                                 Medical Decision Making Amount and/or Complexity of Data Reviewed External Data Reviewed: notes. Labs: ordered. Decision-making details documented in ED Course. Radiology: ordered and independent interpretation performed. Decision-making details documented in ED Course. ECG/medicine tests: ordered and independent interpretation performed. Decision-making details documented in ED  Course.  Risk Prescription drug management.   This patient presents to the ED for concern of SOB, CP, this involves an extensive number of treatment options, and is a complaint that carries with it a high risk of complications and morbidity.  The differential diagnosis includes ACS, PE, pneumonia, CHF, etc  47 year old male presents to the ED due to multiple complaints of persistent sore throat, shortness of breath, pleuritic chest pain, and fatigue.  Patient recently treated for strep throat and UTI where he was discharged with azithromycin and ciprofloxacin which he finished.  Admits to difficulties swallowing both solids and liquids.  Shortness of breath worse with exertion.  Also admits orthopnea and lower extremity edema.  No history of blood clots.  Upon arrival patient afebrile, not tachycardic or hypoxic.  Patient in no acute distress.  2+ tonsillar hypertrophy bilaterally.  No exudates.  Uvula midline.  No abscess.  Tolerating oral secretions without difficulty.  Airway patent.  No signs of respiratory distress.  Lungs clear to auscultation bilaterally.  No stridor or wheeze.  Routine labs ordered.  BNP to rule out CHF.  D-dimer due to pleuritic chest pain to rule out PE.  Chest x-ray to rule out evidence of pneumonia.  Decadron given.  Viscous lidocaine.  CBC significant for leukocytosis at 10.8.  Normal hemoglobin.  Chest x-ray personally reviewed and interpreted which demonstrates left basilar opacities likely atelectasis versus infection.  Given leukocytosis and persistent shortness of breath will treat with antibiotics for pneumonia.  Patient allergic to penicillins.  Will give dose of Levofloxacin here in the ED.  COVID/influenza/RSV negative.  UA negative for signs of infection.  CMP reassuring.  Normal renal function.  Hyperglycemia 155.  No anion gap.  Low suspicion for DKA.  BNP normal.  Low suspicion for undiagnosed CHF.  D-dimer normal.  Low suspicion for PE.  Upon reassessment,  patient admits to some improvement in symptoms.  Mono negative.  No peritonsillar abscess on exam.  Suspect symptoms related to possible pneumonia.  Patient discharged with antibiotics.  Patient able to ambulate in the ED and maintain O2 saturation above 95%  on room air.  No signs of respiratory distress.  Patient stable for discharge. Strict ED precautions discussed with patient. Patient states understanding and agrees to plan. Patient discharged home in no acute distress and stable vitals  Co morbidities that complicate the patient evaluation  HTN, DM Cardiac Monitoring: / EKG:  The patient was maintained on a cardiac monitor.  I personally viewed and interpreted the cardiac monitored which showed an underlying rhythm of: NSR  Social Determinants of Health:  obesity  Test / Admission - Considered:  CTA chest; however d-dimer normal, so low suspicion for PE.        Final Clinical Impression(s) / ED Diagnoses Final diagnoses:  Pneumonia of left lower lobe due to infectious organism    Rx / DC Orders ED Discharge Orders          Ordered    levofloxacin (LEVAQUIN) 750 MG tablet  Daily        06/11/23 1248              Mannie Stabile, PA-C 06/11/23 1254    Cathren Laine, MD 06/13/23 1538

## 2023-06-12 LAB — GC/CHLAMYDIA PROBE AMP (~~LOC~~) NOT AT ARMC
Chlamydia: NEGATIVE
Comment: NEGATIVE
Comment: NORMAL
Neisseria Gonorrhea: NEGATIVE

## 2023-06-13 ENCOUNTER — Ambulatory Visit: Payer: Self-pay | Admitting: Physician Assistant

## 2023-06-24 ENCOUNTER — Ambulatory Visit: Payer: Self-pay | Admitting: Physician Assistant

## 2023-06-26 ENCOUNTER — Other Ambulatory Visit: Payer: Self-pay

## 2024-04-09 ENCOUNTER — Other Ambulatory Visit: Payer: Self-pay

## 2024-05-12 ENCOUNTER — Ambulatory Visit: Payer: Self-pay | Admitting: Family Medicine

## 2024-05-14 ENCOUNTER — Other Ambulatory Visit: Payer: Self-pay

## 2024-05-14 ENCOUNTER — Other Ambulatory Visit: Payer: Self-pay | Admitting: Physician Assistant

## 2024-05-14 DIAGNOSIS — E1140 Type 2 diabetes mellitus with diabetic neuropathy, unspecified: Secondary | ICD-10-CM

## 2024-05-14 NOTE — Telephone Encounter (Signed)
 Copied from CRM [id number]. Topic: Clinical - Medication Refill >> May 14, 2024  9:16 AM Tiffini S wrote: Medication: atorvastatin  (LIPITOR) 20 MG tablet, Dulaglutide  (TRULICITY ) 0.75 MG/0.5ML SOAJ sildenafil  (VIAGRA ) 50 MG tablet   Has the patient contacted their pharmacy? Yes (Agent: If no, request that the patient contact the pharmacy for the refill. If patient does not wish to contact the pharmacy document the reason why and proceed with request.) (Agent: If yes, when and what did the pharmacy advise?)  This is the patient's preferred pharmacy:  [facility] [address] [place] [address] Phone: [phone] Fax: [phone]   Is this the correct pharmacy for this prescription? Yes If no, delete pharmacy and type the correct one.   Has the prescription been filled recently? Yes  Is the patient out of the medication? Yes  Has the patient been seen for an appointment in the last year OR does the patient have an upcoming appointment? Yes  Can we respond through MyChart? No, please the patient at [phone]  Agent: Please be advised that Rx refills may take up to 3 business days. We ask that you follow-up with your pharmacy.

## 2024-05-18 ENCOUNTER — Telehealth: Payer: Self-pay | Admitting: Physician Assistant

## 2024-05-18 NOTE — Telephone Encounter (Signed)
 Left vm and sent mychart message to confirm 05/22/24 appointment-Toni

## 2024-05-22 ENCOUNTER — Encounter: Payer: Self-pay | Admitting: Physician Assistant

## 2024-06-04 ENCOUNTER — Telehealth: Payer: Self-pay | Admitting: Physician Assistant

## 2024-06-04 NOTE — Telephone Encounter (Signed)
 Lvm to reschedule 05/22/2024 missed appointment-Toni

## 2024-06-09 ENCOUNTER — Ambulatory Visit (INDEPENDENT_AMBULATORY_CARE_PROVIDER_SITE_OTHER): Payer: Self-pay | Admitting: Primary Care

## 2024-06-09 ENCOUNTER — Encounter (INDEPENDENT_AMBULATORY_CARE_PROVIDER_SITE_OTHER): Payer: Self-pay

## 2024-06-30 ENCOUNTER — Other Ambulatory Visit: Payer: Self-pay

## 2024-06-30 ENCOUNTER — Other Ambulatory Visit: Payer: Self-pay | Admitting: Physician Assistant

## 2024-06-30 DIAGNOSIS — E1140 Type 2 diabetes mellitus with diabetic neuropathy, unspecified: Secondary | ICD-10-CM

## 2024-07-13 ENCOUNTER — Encounter: Payer: Self-pay | Admitting: Physician Assistant

## 2024-08-17 ENCOUNTER — Other Ambulatory Visit: Payer: Self-pay

## 2024-08-17 MED ORDER — GABAPENTIN 100 MG PO CAPS
100.0000 mg | ORAL_CAPSULE | Freq: Two times a day (BID) | ORAL | 1 refills | Status: AC | PRN
  Filled 2024-08-17: qty 90, 15d supply, fill #0

## 2024-08-17 MED ORDER — MELOXICAM 15 MG PO TABS
ORAL_TABLET | ORAL | 0 refills | Status: AC
  Filled 2024-08-17: qty 30, 30d supply, fill #0

## 2024-08-17 MED ORDER — ATORVASTATIN CALCIUM 20 MG PO TABS
20.0000 mg | ORAL_TABLET | Freq: Every evening | ORAL | 3 refills | Status: AC
  Filled 2024-08-17: qty 90, 90d supply, fill #0

## 2024-08-17 MED ORDER — METFORMIN HCL 500 MG PO TABS
ORAL_TABLET | ORAL | 3 refills | Status: AC
  Filled 2024-08-17: qty 180, 90d supply, fill #0

## 2024-08-17 MED ORDER — TRULICITY 0.75 MG/0.5ML ~~LOC~~ SOAJ
0.7500 mg | SUBCUTANEOUS | 3 refills | Status: AC
  Filled 2024-08-17: qty 2, 28d supply, fill #0

## 2024-08-25 ENCOUNTER — Other Ambulatory Visit: Payer: Self-pay
